# Patient Record
Sex: Male | Born: 1935 | Race: White | Hispanic: No | Marital: Single | Smoking: Former smoker
Health system: Southern US, Community
[De-identification: ages and names within clinical notes are randomized; demographics above are authoritative.]

## PROBLEM LIST (undated history)

## (undated) DIAGNOSIS — Z5181 Encounter for therapeutic drug level monitoring: Secondary | ICD-10-CM

## (undated) DIAGNOSIS — I2510 Atherosclerotic heart disease of native coronary artery without angina pectoris: Secondary | ICD-10-CM

## (undated) DIAGNOSIS — F32A Depression, unspecified: Secondary | ICD-10-CM

## (undated) DIAGNOSIS — I10 Essential (primary) hypertension: Secondary | ICD-10-CM

## (undated) DIAGNOSIS — I209 Angina pectoris, unspecified: Secondary | ICD-10-CM

## (undated) DIAGNOSIS — E871 Hypo-osmolality and hyponatremia: Secondary | ICD-10-CM

## (undated) DIAGNOSIS — R269 Unspecified abnormalities of gait and mobility: Secondary | ICD-10-CM

## (undated) DIAGNOSIS — R2681 Unsteadiness on feet: Secondary | ICD-10-CM

## (undated) DIAGNOSIS — F419 Anxiety disorder, unspecified: Secondary | ICD-10-CM

## (undated) DIAGNOSIS — R569 Unspecified convulsions: Secondary | ICD-10-CM

## (undated) DIAGNOSIS — G43909 Migraine, unspecified, not intractable, without status migrainosus: Secondary | ICD-10-CM

## (undated) DIAGNOSIS — E538 Deficiency of other specified B group vitamins: Secondary | ICD-10-CM

## (undated) DIAGNOSIS — I4891 Unspecified atrial fibrillation: Secondary | ICD-10-CM

## (undated) DIAGNOSIS — E785 Hyperlipidemia, unspecified: Secondary | ICD-10-CM

## (undated) DIAGNOSIS — Z7901 Long term (current) use of anticoagulants: Secondary | ICD-10-CM

## (undated) DIAGNOSIS — E349 Endocrine disorder, unspecified: Secondary | ICD-10-CM

## (undated) DIAGNOSIS — K219 Gastro-esophageal reflux disease without esophagitis: Secondary | ICD-10-CM

## (undated) DIAGNOSIS — K649 Unspecified hemorrhoids: Secondary | ICD-10-CM

## (undated) DIAGNOSIS — F329 Major depressive disorder, single episode, unspecified: Secondary | ICD-10-CM

## (undated) DIAGNOSIS — R079 Chest pain, unspecified: Secondary | ICD-10-CM

## (undated) DIAGNOSIS — N400 Enlarged prostate without lower urinary tract symptoms: Secondary | ICD-10-CM

## (undated) DIAGNOSIS — G40409 Other generalized epilepsy and epileptic syndromes, not intractable, without status epilepticus: Secondary | ICD-10-CM

## (undated) DIAGNOSIS — W19XXXA Unspecified fall, initial encounter: Secondary | ICD-10-CM

## (undated) DIAGNOSIS — R296 Repeated falls: Secondary | ICD-10-CM

## (undated) DIAGNOSIS — M6281 Muscle weakness (generalized): Secondary | ICD-10-CM

## (undated) DIAGNOSIS — H9190 Unspecified hearing loss, unspecified ear: Secondary | ICD-10-CM

## (undated) DIAGNOSIS — I509 Heart failure, unspecified: Secondary | ICD-10-CM

## (undated) DIAGNOSIS — I639 Cerebral infarction, unspecified: Secondary | ICD-10-CM

## (undated) DIAGNOSIS — E860 Dehydration: Secondary | ICD-10-CM

## (undated) DIAGNOSIS — G40909 Epilepsy, unspecified, not intractable, without status epilepticus: Secondary | ICD-10-CM

## (undated) DIAGNOSIS — R29898 Other symptoms and signs involving the musculoskeletal system: Secondary | ICD-10-CM

## (undated) HISTORY — DX: Endocrine disorder, unspecified: E34.9

## (undated) HISTORY — DX: Unspecified abnormalities of gait and mobility: R26.9

## (undated) HISTORY — PX: HERNIA REPAIR: SHX51

## (undated) HISTORY — DX: Unspecified atrial fibrillation: I48.91

## (undated) HISTORY — DX: Hyperlipidemia, unspecified: E78.5

## (undated) HISTORY — DX: Essential (primary) hypertension: I10

## (undated) HISTORY — PX: CATARACT EXTRACTION W/ INTRAOCULAR LENS IMPLANT: SHX1309

## (undated) HISTORY — DX: Chest pain, unspecified: R07.9

## (undated) HISTORY — DX: Dehydration: E86.0

## (undated) HISTORY — DX: Unspecified convulsions: R56.9

## (undated) HISTORY — PX: PILONIDAL CYST EXCISION: SHX744

## (undated) HISTORY — DX: Encounter for therapeutic drug level monitoring: Z51.81

## (undated) HISTORY — DX: Long term (current) use of anticoagulants: Z79.01

## (undated) HISTORY — DX: Unspecified hearing loss, unspecified ear: H91.90

## (undated) HISTORY — DX: Other symptoms and signs involving the musculoskeletal system: R29.898

## (undated) HISTORY — DX: Repeated falls: R29.6

## (undated) HISTORY — DX: Hypo-osmolality and hyponatremia: E87.1

## (undated) HISTORY — PX: WRIST FRACTURE SURGERY: SHX121

## (undated) HISTORY — DX: Unspecified hemorrhoids: K64.9

## (undated) HISTORY — DX: Muscle weakness (generalized): M62.81

## (undated) HISTORY — PX: TONSILLECTOMY: SUR1361

## (undated) HISTORY — DX: Unsteadiness on feet: R26.81

## (undated) HISTORY — PX: CARDIAC CATHETERIZATION: SHX172

---

## 1967-12-17 HISTORY — PX: INGUINAL HERNIA REPAIR: SUR1180

## 1985-12-16 HISTORY — PX: ANGIOPLASTY: SHX39

## 1994-12-16 HISTORY — PX: ANKLE FRACTURE SURGERY: SHX122

## 1999-10-15 ENCOUNTER — Encounter: Payer: Self-pay | Admitting: Emergency Medicine

## 1999-10-15 ENCOUNTER — Inpatient Hospital Stay (HOSPITAL_COMMUNITY): Admission: EM | Admit: 1999-10-15 | Discharge: 1999-10-16 | Payer: Self-pay | Admitting: Emergency Medicine

## 2000-03-26 ENCOUNTER — Encounter: Payer: Self-pay | Admitting: Emergency Medicine

## 2000-03-26 ENCOUNTER — Inpatient Hospital Stay (HOSPITAL_COMMUNITY): Admission: EM | Admit: 2000-03-26 | Discharge: 2000-04-07 | Payer: Self-pay | Admitting: Emergency Medicine

## 2000-03-31 ENCOUNTER — Encounter: Payer: Self-pay | Admitting: Thoracic Surgery (Cardiothoracic Vascular Surgery)

## 2000-04-01 ENCOUNTER — Encounter: Payer: Self-pay | Admitting: Thoracic Surgery (Cardiothoracic Vascular Surgery)

## 2000-04-01 HISTORY — PX: CORONARY ARTERY BYPASS GRAFT: SHX141

## 2000-04-02 ENCOUNTER — Encounter: Payer: Self-pay | Admitting: Thoracic Surgery (Cardiothoracic Vascular Surgery)

## 2000-04-29 ENCOUNTER — Encounter (HOSPITAL_COMMUNITY): Admission: RE | Admit: 2000-04-29 | Discharge: 2000-07-28 | Payer: Self-pay | Admitting: Cardiology

## 2001-04-30 ENCOUNTER — Encounter: Admission: RE | Admit: 2001-04-30 | Discharge: 2001-04-30 | Payer: Self-pay | Admitting: Internal Medicine

## 2001-04-30 ENCOUNTER — Encounter: Payer: Self-pay | Admitting: Internal Medicine

## 2002-07-09 ENCOUNTER — Ambulatory Visit (HOSPITAL_COMMUNITY): Admission: RE | Admit: 2002-07-09 | Discharge: 2002-07-09 | Payer: Self-pay | Admitting: Gastroenterology

## 2003-03-11 HISTORY — PX: US ECHOCARDIOGRAPHY: HXRAD669

## 2004-01-03 ENCOUNTER — Observation Stay (HOSPITAL_COMMUNITY): Admission: EM | Admit: 2004-01-03 | Discharge: 2004-01-04 | Payer: Self-pay | Admitting: Emergency Medicine

## 2004-11-05 HISTORY — PX: CARDIOVASCULAR STRESS TEST: SHX262

## 2005-01-04 ENCOUNTER — Ambulatory Visit (HOSPITAL_COMMUNITY): Admission: RE | Admit: 2005-01-04 | Discharge: 2005-01-04 | Payer: Self-pay | Admitting: Cardiology

## 2005-01-04 HISTORY — PX: CARDIAC CATHETERIZATION: SHX172

## 2007-02-10 ENCOUNTER — Inpatient Hospital Stay (HOSPITAL_COMMUNITY): Admission: EM | Admit: 2007-02-10 | Discharge: 2007-02-12 | Payer: Self-pay | Admitting: Emergency Medicine

## 2007-02-11 HISTORY — PX: CARDIAC CATHETERIZATION: SHX172

## 2008-12-05 ENCOUNTER — Emergency Department (HOSPITAL_COMMUNITY): Admission: EM | Admit: 2008-12-05 | Discharge: 2008-12-05 | Payer: Self-pay | Admitting: Emergency Medicine

## 2008-12-16 DIAGNOSIS — I639 Cerebral infarction, unspecified: Secondary | ICD-10-CM

## 2008-12-16 HISTORY — DX: Cerebral infarction, unspecified: I63.9

## 2009-01-09 ENCOUNTER — Emergency Department (HOSPITAL_COMMUNITY): Admission: EM | Admit: 2009-01-09 | Discharge: 2009-01-09 | Payer: Self-pay | Admitting: Emergency Medicine

## 2009-01-18 HISTORY — PX: US ECHOCARDIOGRAPHY: HXRAD669

## 2009-04-18 ENCOUNTER — Ambulatory Visit: Payer: Self-pay | Admitting: Oncology

## 2009-04-24 LAB — COMPREHENSIVE METABOLIC PANEL
Albumin: 3.9 g/dL (ref 3.5–5.2)
Alkaline Phosphatase: 166 U/L — ABNORMAL HIGH (ref 39–117)
BUN: 14 mg/dL (ref 6–23)
Creatinine, Ser: 0.78 mg/dL (ref 0.40–1.50)
Glucose, Bld: 134 mg/dL — ABNORMAL HIGH (ref 70–99)
Total Bilirubin: 0.7 mg/dL (ref 0.3–1.2)

## 2009-04-24 LAB — CBC WITH DIFFERENTIAL/PLATELET
Basophils Absolute: 0 10*3/uL (ref 0.0–0.1)
Eosinophils Absolute: 0.1 10*3/uL (ref 0.0–0.5)
HGB: 14.9 g/dL (ref 13.0–17.1)
LYMPH%: 12.6 % — ABNORMAL LOW (ref 14.0–49.0)
MCH: 32.9 pg (ref 27.2–33.4)
MCV: 94.9 fL (ref 79.3–98.0)
MONO%: 9.9 % (ref 0.0–14.0)
NEUT#: 5.1 10*3/uL (ref 1.5–6.5)
NEUT%: 75.4 % — ABNORMAL HIGH (ref 39.0–75.0)
Platelets: 219 10*3/uL (ref 140–400)

## 2009-04-24 LAB — URIC ACID: Uric Acid, Serum: 4.3 mg/dL (ref 4.0–7.8)

## 2009-04-26 LAB — IMMUNOFIXATION ELECTROPHORESIS
IgA: 137 mg/dL (ref 68–378)
IgG (Immunoglobin G), Serum: 645 mg/dL — ABNORMAL LOW (ref 694–1618)
IgM, Serum: 39 mg/dL — ABNORMAL LOW (ref 60–263)
Total Protein, Serum Electrophoresis: 6.7 g/dL (ref 6.0–8.3)

## 2009-04-26 LAB — KAPPA/LAMBDA LIGHT CHAINS: Lambda Free Lght Chn: 3.29 mg/dL — ABNORMAL HIGH (ref 0.57–2.63)

## 2009-05-03 LAB — CREATININE CLEARANCE, URINE, 24 HOUR
Collection Interval-CRCL: 24 hours
Creatinine Clearance: 91 mL/min (ref 75–125)
Urine Total Volume-CRCL: 1125 mL

## 2009-05-03 LAB — UIFE/LIGHT CHAINS/TP QN, 24-HR UR
Alpha 2, Urine: DETECTED — AB
Beta, Urine: DETECTED — AB
Free Lt Chn Excr Rate: 65.25 mg/d
Total Protein, Urine-Ur/day: 79 mg/d (ref 10–140)

## 2009-07-12 ENCOUNTER — Ambulatory Visit: Payer: Self-pay | Admitting: Oncology

## 2009-07-17 ENCOUNTER — Encounter: Admission: RE | Admit: 2009-07-17 | Discharge: 2009-09-18 | Payer: Self-pay | Admitting: Neurology

## 2009-08-10 LAB — CBC WITH DIFFERENTIAL/PLATELET
Basophils Absolute: 0 10*3/uL (ref 0.0–0.1)
Eosinophils Absolute: 0.2 10*3/uL (ref 0.0–0.5)
HGB: 16.2 g/dL (ref 13.0–17.1)
MCV: 93.9 fL (ref 79.3–98.0)
MONO%: 9.5 % (ref 0.0–14.0)
NEUT#: 4.4 10*3/uL (ref 1.5–6.5)
RBC: 5.01 10*6/uL (ref 4.20–5.82)
RDW: 12.8 % (ref 11.0–14.6)
WBC: 6.2 10*3/uL (ref 4.0–10.3)
lymph#: 0.9 10*3/uL (ref 0.9–3.3)

## 2009-08-11 ENCOUNTER — Ambulatory Visit (HOSPITAL_COMMUNITY): Admission: RE | Admit: 2009-08-11 | Discharge: 2009-08-11 | Payer: Self-pay | Admitting: Oncology

## 2009-08-11 LAB — COMPREHENSIVE METABOLIC PANEL
AST: 19 U/L (ref 0–37)
Albumin: 4.3 g/dL (ref 3.5–5.2)
Alkaline Phosphatase: 156 U/L — ABNORMAL HIGH (ref 39–117)
Calcium: 8.8 mg/dL (ref 8.4–10.5)
Chloride: 99 mEq/L (ref 96–112)
Glucose, Bld: 88 mg/dL (ref 70–99)
Potassium: 4.6 mEq/L (ref 3.5–5.3)
Sodium: 136 mEq/L (ref 135–145)
Total Protein: 6.7 g/dL (ref 6.0–8.3)

## 2009-12-18 ENCOUNTER — Ambulatory Visit: Payer: Self-pay | Admitting: Oncology

## 2009-12-21 LAB — CBC WITH DIFFERENTIAL/PLATELET
BASO%: 0.4 % (ref 0.0–2.0)
Eosinophils Absolute: 0.1 10*3/uL (ref 0.0–0.5)
HCT: 46.4 % (ref 38.4–49.9)
HGB: 15.9 g/dL (ref 13.0–17.1)
MCHC: 34.3 g/dL (ref 32.0–36.0)
MONO#: 0.6 10*3/uL (ref 0.1–0.9)
NEUT#: 4.4 10*3/uL (ref 1.5–6.5)
NEUT%: 73 % (ref 39.0–75.0)
WBC: 6.1 10*3/uL (ref 4.0–10.3)
lymph#: 0.9 10*3/uL (ref 0.9–3.3)

## 2009-12-21 LAB — COMPREHENSIVE METABOLIC PANEL
ALT: 12 U/L (ref 0–53)
AST: 19 U/L (ref 0–37)
Albumin: 4.4 g/dL (ref 3.5–5.2)
BUN: 16 mg/dL (ref 6–23)
Calcium: 8.2 mg/dL — ABNORMAL LOW (ref 8.4–10.5)
Chloride: 101 mEq/L (ref 96–112)
Potassium: 4.7 mEq/L (ref 3.5–5.3)

## 2009-12-21 LAB — IGG, IGA, IGM: IgM, Serum: 34 mg/dL — ABNORMAL LOW (ref 60–263)

## 2010-04-19 ENCOUNTER — Ambulatory Visit: Payer: Self-pay | Admitting: Oncology

## 2010-04-20 LAB — CBC WITH DIFFERENTIAL/PLATELET
BASO%: 0.5 % (ref 0.0–2.0)
EOS%: 2.8 % (ref 0.0–7.0)
HCT: 48.9 % (ref 38.4–49.9)
LYMPH%: 16.6 % (ref 14.0–49.0)
MCH: 32.5 pg (ref 27.2–33.4)
MCHC: 34.2 g/dL (ref 32.0–36.0)
MONO%: 11.3 % (ref 0.0–14.0)
NEUT%: 68.8 % (ref 39.0–75.0)
Platelets: 200 10*3/uL (ref 140–400)

## 2010-04-20 LAB — COMPREHENSIVE METABOLIC PANEL
ALT: 14 U/L (ref 0–53)
AST: 24 U/L (ref 0–37)
Creatinine, Ser: 0.89 mg/dL (ref 0.40–1.50)
Total Bilirubin: 0.4 mg/dL (ref 0.3–1.2)

## 2010-04-20 LAB — IGG, IGA, IGM: IgM, Serum: 38 mg/dL — ABNORMAL LOW (ref 60–263)

## 2010-04-20 LAB — LACTATE DEHYDROGENASE: LDH: 185 U/L (ref 94–250)

## 2010-08-07 ENCOUNTER — Ambulatory Visit: Payer: Self-pay | Admitting: Cardiology

## 2010-10-18 ENCOUNTER — Ambulatory Visit: Payer: Self-pay | Admitting: Oncology

## 2010-10-22 LAB — IGG, IGA, IGM: IgM, Serum: 29 mg/dL — ABNORMAL LOW (ref 60–263)

## 2010-10-22 LAB — COMPREHENSIVE METABOLIC PANEL
ALT: 10 U/L (ref 0–53)
AST: 17 U/L (ref 0–37)
Albumin: 4.3 g/dL (ref 3.5–5.2)
BUN: 15 mg/dL (ref 6–23)
Calcium: 8.8 mg/dL (ref 8.4–10.5)
Chloride: 100 mEq/L (ref 96–112)
Potassium: 4.9 mEq/L (ref 3.5–5.3)
Sodium: 135 mEq/L (ref 135–145)
Total Protein: 6.2 g/dL (ref 6.0–8.3)

## 2010-10-22 LAB — CBC WITH DIFFERENTIAL/PLATELET
BASO%: 0.6 % (ref 0.0–2.0)
HCT: 51.1 % — ABNORMAL HIGH (ref 38.4–49.9)
HGB: 17.3 g/dL — ABNORMAL HIGH (ref 13.0–17.1)
MCHC: 33.8 g/dL (ref 32.0–36.0)
MONO#: 0.6 10*3/uL (ref 0.1–0.9)
NEUT%: 75.1 % — ABNORMAL HIGH (ref 39.0–75.0)
RDW: 13.4 % (ref 11.0–14.6)
WBC: 6 10*3/uL (ref 4.0–10.3)
lymph#: 0.7 10*3/uL — ABNORMAL LOW (ref 0.9–3.3)

## 2011-01-07 ENCOUNTER — Encounter (HOSPITAL_COMMUNITY): Payer: Self-pay | Admitting: Oncology

## 2011-02-07 ENCOUNTER — Ambulatory Visit (INDEPENDENT_AMBULATORY_CARE_PROVIDER_SITE_OTHER): Payer: Medicare Other | Admitting: Cardiology

## 2011-02-07 DIAGNOSIS — I4891 Unspecified atrial fibrillation: Secondary | ICD-10-CM

## 2011-02-07 DIAGNOSIS — I2510 Atherosclerotic heart disease of native coronary artery without angina pectoris: Secondary | ICD-10-CM

## 2011-04-01 LAB — URINALYSIS, ROUTINE W REFLEX MICROSCOPIC
Bilirubin Urine: NEGATIVE
Glucose, UA: NEGATIVE mg/dL
Ketones, ur: NEGATIVE mg/dL
Specific Gravity, Urine: 1.025 (ref 1.005–1.030)
pH: 5.5 (ref 5.0–8.0)

## 2011-04-01 LAB — URINE MICROSCOPIC-ADD ON

## 2011-04-28 ENCOUNTER — Emergency Department (HOSPITAL_COMMUNITY)
Admission: EM | Admit: 2011-04-28 | Discharge: 2011-04-28 | Disposition: A | Discharge status: Home / Self Care | Payer: Medicare Other | Attending: Emergency Medicine | Admitting: Emergency Medicine

## 2011-04-28 DIAGNOSIS — I4891 Unspecified atrial fibrillation: Secondary | ICD-10-CM | POA: Insufficient documentation

## 2011-04-28 DIAGNOSIS — Z79899 Other long term (current) drug therapy: Secondary | ICD-10-CM | POA: Insufficient documentation

## 2011-04-28 DIAGNOSIS — Z7901 Long term (current) use of anticoagulants: Secondary | ICD-10-CM | POA: Insufficient documentation

## 2011-04-28 DIAGNOSIS — I2510 Atherosclerotic heart disease of native coronary artery without angina pectoris: Secondary | ICD-10-CM | POA: Insufficient documentation

## 2011-04-28 DIAGNOSIS — Z951 Presence of aortocoronary bypass graft: Secondary | ICD-10-CM | POA: Insufficient documentation

## 2011-04-28 DIAGNOSIS — L989 Disorder of the skin and subcutaneous tissue, unspecified: Secondary | ICD-10-CM | POA: Insufficient documentation

## 2011-04-28 DIAGNOSIS — X58XXXA Exposure to other specified factors, initial encounter: Secondary | ICD-10-CM | POA: Insufficient documentation

## 2011-04-28 DIAGNOSIS — S2095XA Superficial foreign body of unspecified parts of thorax, initial encounter: Secondary | ICD-10-CM | POA: Insufficient documentation

## 2011-04-30 NOTE — Consult Note (Signed)
NAME:  Joseph Bentley, Joseph Bentley NO.:  [id number]   MEDICAL RECORD NO.:  [id number]          PATIENT TYPE:  EMS   LOCATION:  MAJO                         FACILITY:  [facility]   PHYSICIAN:  Levert Feinstein, MD          DATE OF BIRTH:  01/28/1926   DATE OF CONSULTATION:  DATE OF DISCHARGE:                                 CONSULTATION   CLINICAL HISTORY:  The patient is an 77 year old gentleman with previous  history of seizure presenting with recurrent seizure x2 this afternoon.   The patient's wife was driving him to a restaurant, stopped at a stop  sign.  The patient suddenly went into general tonic-clonic seizure,  lasted few minutes, and afterwards limp forward and gasping for air.  Wife later parked in front of the restaurant and called 911, and when  she met him 1 hour later about 12:30 afternoon in the hospital, the  patient was awake and alert; however, about 30 minutes later, 1 hour  from the initial seizure, he suffered his second general tonic-clonic,  lasted for few minutes followed by post event confusion.  He was given  2mg  Ativan at 1:40 p.m., 3 hours prior to today's examination, also  loaded with Dilantin 500 mg IV.   The patient is sleepy, but denied lateralized motor sensory deficit of  vision change, was able to give accurate history, and Dilantin level was  12.  He was supposed to take Dilantin 200 b.i.d.  He has been compliant  with the medications.   CT of the brain demonstrate no acute change other than subacute to  chronic bilateral external capsule stroke.   He also had a past medical history of atrial fibrillation on Coumadin.  Coronary artery disease.   PAST MEDICAL HISTORY:  1. Atrial fibrillation.  2. Coronary artery disease.  3. Hypertension.  4. GERD.   PAST SURGICAL HISTORY:  CABG.   FAMILY HISTORY:  He lives with his wife.  Denies smoking and drinking.  Retired.  Highly functional.  Last seizure was 3 years ago.   FAMILY HISTORY:   Noncontributory.   HOME MEDICATIONS:  1. Dilantin 200 b.i.d.  2. Diovan 80 mg once a day.  3. Metoprolol 100 mg once a day.  4. Pantoprazole 40 mg once a day.  5. Crestor 20 mg once a day.   HISTORY OF PRESENT ILLNESS:  The patient has a history of epilepsy since  childhood, has been on Dilantin treatment for 40 years, and the last  seizure was 3 years ago, and reported has been under better control when  he was younger.   PHYSICAL EXAMINATION:  VIAL SIGNS:  Temperature 97.3, blood pressure  182/92 upon initial presentation, now is 138/85, heart rate of 100,  respiration of 20.  CARDIAC:  Tachy irregular.  PULMONARY:  Clear to auscultation bilaterally.  NECK:  Supple.  No carotid bruits.  NEUROLOGIC:  He is sleepy but was able to answer question correctly,  gave accurate history and followed neurological examination.  Cranial  nerves II through XII.  Pupil equal, round, and reactive to  light and  extraocular movements were full.  There was end-gaze horizontal  nystagmus, and right fundi were sharp.  Visual fields were full on  confrontational test.  Facial sensation strength was normal.  Uvula and  tongue midline.  Head turning shoulder shrugging normal symmetric.  Tongue protrusion into cheek strength was normal.  Motor examination  normal tone park and strength and then sensory normal to light touch and  pinprick.  Deep tendon reflex hypoactive and symmetric.  Plantar  responses were flexor.  Coordination:  Normal finger-to-nose, heel-to-  shin.  Gait was deferred.   LABORATORY EVALUATION:  INR was 2.  Dilantin level is 12.  CBC has  demonstrated elevated hemoglobin of 18.   ASSESSMENT AND PLAN:  An 77 year old male with history of epilepsy with  recurrent seizure.  1. Continue to observe.  Okay to discharge home if the patient becomes      less sleepy and regain previous functional level.  2. Discharge home with Dilantin 200 mg b.i.d.  3. Follow up with Dr. Anne Hahn as  previously scheduled  on Wednesday,      December 30th, 2009.      Levert Feinstein, MD  Electronically Signed     YY/MEDQ  D:  12/05/2008  T:  12/06/2008  Job:  [id number]

## 2011-05-03 NOTE — H&P (Signed)
NAME:  Joseph Bentley, Joseph Bentley                         ACCOUNT NO.:  [id number]   MEDICAL RECORD NO.:  [id number]                   PATIENT TYPE:  OBV   LOCATION:  1832                                 FACILITY:  [facility]   PHYSICIAN:  [name], M.D.                 DATE OF BIRTH:  05/30/1926   DATE OF ADMISSION:  01/02/2004  DATE OF DISCHARGE:                                HISTORY & PHYSICAL   PRIMARY CARE PHYSICIAN:  Dr. __________ .   NEUROLOGIST:  Marlan Palau, M.D.   CARDIOLOGIST:  Dr. Colleen Can. Deborah Chalk, M.D.   CHIEF COMPLAINT:  Seizure, low Dilantin level and postictal state.   HISTORY OF PRESENT ILLNESS:  A 77 year old male with a history of seizure  disorder, since 77 years old, on Dilantin treatment, followed by Dr. Anne Hahn,  who had hemoptysis associated with an upper respiratory tract infection,  bronchitis, treated with a Z-Pak and backing off on some of his Coumadin.  This was followed by diarrhea over the last 2-3 days.  The diarrhea is  currently improving, but he did have some today.  He has one more dose of  azithromycin left.  Tonight he was doing well until about 7:30, when he  started having seizures.  EMS was called.  He had multiple seizures  throughout home, EMS and the emergency room approximately 3-4.  The longest  one lasted approximately 5-10 minutes, full body tonic/clonic jerking.  The  last seizure was somewhere around 2002.  He was given Dilantin 500 mg IV x  1, Cardizem 10 mg IV x 1 for a rapid Afib and 2 mg of IV Ativan x 2-3 doses.  He is now postictal but coming out of it.  On arrival, he had a  nonrebreather mask and had to tie down his hands due to the fact that he was  pulling out all lines and telemetry leads, currently improving.   PAST MEDICAL HISTORY:  1. Colonoscopy, July 2003, normal except internal hemorrhoids.  2. Coronary artery disease, three vessel coronary bypass grafting.  3. Pilonidal cyst.  4. Seizure disorder since 13.  5.  History of Afib.  6. Bilateral hernia replacement repair.  7. T&A.  8. Hypertension.  9. Hyperlipidemia.   ALLERGIES:  No known drug allergies.   MEDICATIONS:  1. Dilantin 200 b.i.d.  2. Coumadin 5 every day.  3. Fosamax 70 every week.  4. Lipitor 80 every day.  5. Toprol XL 100 every day.  6. Diovan 80 every day.   SOCIAL HISTORY:  He is retired.  He lives with his wife.  No tobacco, no  alcohol.   FAMILY HISTORY:  Coronary disease, hypertension.   REVIEW OF SYSTEMS:  Please see HPI.  He has had a cough, hemoptysis, sputum,  upper respiratory symptoms, diarrhea but no chest pain, no shortness of  breath, no other symptoms at this current time.  All  organ systems reviewed  and negative.   PHYSICAL EXAMINATION:  VITAL SIGNS:  Blood pressure 129/74, heart rate 100-  119, sating 97% on 2 liters.  Breathing 18 times a minute and afebrile.  GENERAL:  Groggy but will arouse and answer questions appropriately.  EARS/NOSE/THROAT:  Tongue bitten and swollen.  PERL.  EOMI.  Cranial nerves  II-XII grossly intact.  PULMONARY:  Clear to auscultation bilaterally.  CARDIAC:  Irregular and tachycardic.  ABDOMEN:  Soft and nontender.  EXTREMITIES:  No edema.  NEUROLOGIC:  Grossly intact at this moment.   ANCILLARY DATA:  Urinalysis is negative except for some proteinuria and 3-6  red blood cells per high-powered field.  Laboratory data shows a sodium of  132, potassium 4.3, chloride 102, bicarb 16, BUN 17, creatinine 0.7, glucose  207, pH was 7.24.  Hemoglobin 16.0, white blood cell count 7.3, platelet  count 286.  INR 1.8.  Dilantin 10.3.   Cranial CT negative except for sinusitis.   EKG shows Afib, RVR, PVCs, now heart rate is slower.   ASSESSMENT:  1. This is an elderly male with 3-4 seizures tonight, now postictal.  2. In relation to upper respiratory infection, antibiotic use and diarrhea,     status post Dilantin and Ativan treatment here in the emergency room.   PLAN:  1. A  23 hour observation.  2. Seizure precautions.  3. Agree with IV Dilantin that has been given.  4. Allow the patient to wake up.  5. Continue with Dr. Anne Hahn evaluation in the morning, if not completely     better.  6. Check chest x-ray PA and lateral in the a.m.  7. Hold on antibiotics, one more dose of azithromycin and for the sinusitis     seen on CT until Dr. __________ has a chance to see and evaluate the     patient in the morning.  8. Afib with RVR status post 10 mg Cardizem IV.  We will start his Toprol XL     back and allow his heart rate to come down to his baseline.  Manage INR     at 1.8-2.5.  9. Increased blood sugars.  We will follow hemoglobin A1c and check CBGs.  10.      Do not feed until awake and alert, avoid aspiration.  11.      We will check a B-MET in the morning.  His acidosis should resolve     the further away he gets from the seizure.  He probably has an underlying     lactic acidosis from the seizure as noted above.                                                [name], M.D.    JMR/MEDQ  D:  01/03/2004  T:  01/03/2004  Job:  [id number]

## 2011-05-03 NOTE — H&P (Signed)
NAME:  ALBERT, HERSCH NO.:  [id number]   MEDICAL RECORD NO.:  [id number]          PATIENT TYPE:  EMS   LOCATION:  MAJO                         FACILITY:  [facility]   PHYSICIAN:  Colleen Can. Deborah Chalk, M.D.DATE OF BIRTH:  September 09, 1926   DATE OF ADMISSION:  02/10/2007  DATE OF DISCHARGE:                              HISTORY & PHYSICAL   HISTORY OF PRESENT ILLNESS:  Claudius Mich is a 77 year old white man  who is admitted to [facility] for further evaluation of chest  pain.   The patient has a history of coronary artery disease which dates back to  53.  At that time, he underwent PTCA of an unknown vessel.  In 2001,  he underwent coronary artery bypass surgery.  He received a left  internal mammary artery graft to the LAD, a saphenous vein graft to the  first diagonal, a sequential saphenous vein graft to the first and then  third obtuse marginals and a sequential saphenous vein graft to the  right coronary artery and then posterior descending artery.  His last  cardiac catheterization was performed in January 2006.  This  demonstrated essentially normal left ventricular function with very  minimal anterior hypokinesis.  There was severe three-vessel coronary  artery disease with a totally occluded right coronary artery, totally  occluded LAD and totally occluded obtuse marginal.  All saphenous vein  grafts were patent.  Continued medical therapy was recommended.   The patient presented to the emergency department after experiencing an  episode of chest pain this evening; it awoke him for sleep.  The chest  pain was described as a pressure in the left anterior lower chest.  It  did not radiate.  It was not associated with dyspnea, diaphoresis, or  nausea.  There were no exacerbating or ameliorating factors.  It  appeared not to be related to position, activity, meals or respirations.  He did not take a nitroglycerin.  It resolved spontaneously in  approximately 1 hour.  He reports that it was similar in quality to his  prior cardiac chest pain.  He adds that he has experienced 2 additional,  similar episodes in the last week.  He is free of chest pain at this  time and is otherwise asymptomatic.   The patient also has a history of chronic atrial fibrillation.  There is  no history of congestive heart failure.   The patient has a number of risk factors for coronary artery disease  including hypertension, dyslipidemia, and family history (father and  mother both suffered from coronary artery disease).  There is no history  of diabetes mellitus or smoking.   PAST MEDICAL HISTORY:  The patient's past medical history is otherwise  remarkable only for a seizure disorder.   MEDICATIONS:  1. Dilantin 200 mg p.o. b.i.d.  2. Toprol-XL 100 mg p.o. daily.  3. Diovan 80 mg p.o. daily.  4. Coumadin 5 mg p.o. daily, except on Friday, 2.5 mg p.o.   ALLERGIES:  None.   OPERATIONS:  1. Coronary artery bypass surgery.  2. Excision of a pilonidal cyst.  SOCIAL HISTORY:  The patient lives with his wife.  He is a retired  [employer] manager.  He does not drink alcohol.  He does not smoke  cigarettes.   FAMILY HISTORY:  Notable for coronary artery disease in both of his  parents.   REVIEW OF SYSTEMS:  Review of systems reveals no new problems related to  his head, eyes, ears, nose, mouth, throat, lungs, gastrointestinal  system, genitourinary system, or extremities.  There is no history of  neurologic or psychiatric disorder.  There is no history of fever,  chills or weight loss.   PHYSICAL EXAMINATION:  VITAL SIGNS:  Blood pressure 165/90.  Pulse 95  and irregularly irregular.  Respirations 20.  Temperature 97.5.  GENERAL:  The patient was an elderly white man in no discomfort.  He was  alert, oriented, appropriate, and responsive.  HEENT:  Head, eyes, nose, and mouth were normal.  NECK:  Without thyromegaly or adenopathy.  Carotid  pulses were palpable  bilaterally and without bruits.  CARDIAC:  Examination revealed an irregularly irregular rhythm.  There  was no murmur, rub, gallop, or click.  No chest wall tenderness was  noted.  LUNGS:  Clear.  ABDOMEN:  Soft and nontender.  There was no mass, hepatosplenomegaly,  bruit, distention, rebound, guarding, or rigidity.  Bowel sounds were  normal.  RECTAL AND GENITAL:  Examinations were not performed as they were not  pertinent to the reason for acute care hospitalization.  EXTREMITIES:  Without edema, deviation, or deformity.  Radial and  dorsalis pedal pulses were palpable bilaterally.  NEUROLOGIC:  Brief screening neurologic survey was unremarkable.   LABORATORY AND ACCESSORY CLINICAL DATA:  The electrocardiogram revealed  atrial fibrillation with a ventricular rate of 78 beats per minute.   The chest radiograph revealed no evidence of congestive heart failure or  pneumonia.   The initial set of cardiac markers revealed a myoglobin of 56.8, CK-MB  1.5, and troponin less than 0.05.  Potassium is 4.2, BUN 25, and  creatinine 0.9.  White count was 5.8 with a hemoglobin of 14.9 and  hematocrit of 43.2.  The remaining studies were pending at the time of  this dictation.   IMPRESSION:  1. Chest pain; rule out unstable angina.  2. Coronary artery disease, status post coronary artery bypass surgery      in 2001, detailed above.  The last cardiac catheterization,      performed in January 2006, demonstrated all grafts to be patent.  3. Chronic atrial fibrillation.  4. Hypertension.  5. Dyslipidemia.  6. Seizure disorder.   PLAN:  1. Telemetry.  2. Serial cardiac enzymes.  3. No aspirin, heparin, or Lovenox, as INR is 2.3.  4. Intravenous nitroglycerin.  5. Further measures per Dr. Deborah Chalk.      Ulyses Amor, MD   Electronically Signed     ______________________________ Colleen Can. Deborah Chalk, M.D.    MSC/MEDQ  D:  02/10/2007  T:  02/10/2007   Job:  [id number]   cc:   Ulyses Amor, MD

## 2011-05-03 NOTE — H&P (Signed)
[place]  Patient:    Joseph Bentley, Joseph Bentley                      MRN: [id number] Adm. Date:  [id number] Attending:  Dolores Patty CC:         Mahala Menghini. Evonnie Dawes, M.D. [facility]             [employer]. Deborah Chalk, M.D.                         History and Physical  HISTORY:  Joseph Bentley is a 77 year old white male admitted with chest pain, which he describes as "tightness."  It is described as substernal without radiation; t occurred this morning after he had walked an hour and while he was showering. e did not have actual symptoms while walking.  This was associated with profuse diaphoresis, according to his wife.  He denies nausea.  It lasted for a total of 1-2 minutes.  He did not take any treatment for this but is on aspirin each morning; he had taken his morning aspirin.  PAST HISTORY:  He had angioplasty in 1987 by Dr. Delfin Edis.  He has had surgery for pilonidal cyst, hernia bilaterally, and tonsillectomy.  He has had a history of seizure disorder since the age of 28.  His last seizure in October 2000.  MEDICATIONS:  1. Aspirin 325 mg daily.  2. Toprol XL 50 mg daily.  3. Dilantin 100 mg 2 twice a day.  4. Multivitamins.  ALLERGIES:  No known drug allergies.  SOCIAL HISTORY:  He is a retired Tree surgeon.  He does not drink or smoke.  FAMILY HISTORY:  Positive for myocardial infarction in his father at 57 and brother at 23.  His father also had hypertension.  There is no family history of cancer or diabetes.  REVIEW OF SYSTEMS:  Negative except for the following items:  His wife states that he does snore with possible apnea for 1-2 seconds.  He describes an occasional ry cough.  He denies taking any ACE inhibitors.  As stated, the remainder of the review of systems was negative.  Specifically, he has had no other cardiopulmonary symptoms other than the symptoms outlined above and the dry cough.  He has no GU symptoms and  denies any arthritis.  PHYSICAL EXAMINATION:  GENERAL:  He is in no acute distress.  VITAL SIGNS:  Blood pressure 152/83, pulse 62 and regular with a normal sinus rhythm on telemetry.  Respiratory rate is 18.  HEENT:  There is slight ptosis on the left.  Arteriolar narrowing is present. There is fullness in the posterior pharynx without airway obstruction.  NECK:  There is a possible faint right carotid bruit.  HEART:  An S4 is noted with a grade 1/2 systolic murmur.  Minor rales are noted, but there is no increased work of breathing.  ABDOMEN:  Soft and nontender.  He has operative scars in the lower quadrants bilaterally.  EXTREMITIES:  A fine tremor is noted of the left upper extremity.  He has no edema and pedal pulses are intact.  There is full range of motion of extremities.  NEUROPSYCHIATRIC:  Grossly intact.  DIAGNOSTIC STUDIES:  EKG reveals sinus bradycardia.  PLAN:  He is admitted to telemetry with cardiac enzymes because of the suggested history, the previous history of angioplasty, and the positive family history.  Dr. Deborah Chalk will be consulted.  He  will receive parenteral pain medications.  _________ _________ appears to be quiescent and he will be continued on his Dilantin.  He is mildly hypertensive and the Toprol dose may need to be increased.  There is a question of possible sleep apnea and there are some posterior pharyngeal anatomic changes.  Once he is stable, sleep evaluation could be conducted as an outpatient if Dr. Evonnie Dawes feels this is indicated. DD:  03/26/00 TD:  03/27/00 Job: [id number]

## 2011-05-03 NOTE — Discharge Summary (Signed)
NAME:  Joseph Bentley, Joseph Bentley NO.:  [id number]   MEDICAL RECORD NO.:  [id number]          PATIENT TYPE:  INP   LOCATION:  4715                         FACILITY:  [facility]   PHYSICIAN:  Colleen Can. Deborah Chalk, M.D.DATE OF BIRTH:  [birth year]   DATE OF ADMISSION:  02/09/2007  DATE OF DISCHARGE:  02/12/2007                               DISCHARGE SUMMARY   PRIMARY DISCHARGE DIAGNOSIS:  Chest pain with subsequent elective  cardiac catheterization on February 11, 2007, showing global ejection  fraction of 60%.  There is minimal anterior hypokinesis.  The saphenous  vein graft to the right coronary and posterior descending is patent.  Saphenous vein graft to the diagonal was patent. Saphenous vein graft to  the obtuse marginal 1 and 2 is patent and left internal mammary to the  left anterior descending is patent.  The native coronary artery showed  100% occluded right coronary with a 95% stenosis proximally before small  acute marginal branch.  The left circumflex is 100% occluded at the  first obtuse marginal with competitive flow to the second obtuse  marginal.  The left anterior descending has 100% proximal narrowing  after septal perforating branch.  The left main has an ostial 50%  lesion.   SECONDARY DISCHARGE DIAGNOSES:  1. Known atherosclerotic cardiovascular disease that dates back to      1987 with remote history of angioplasty and subsequent coronary      artery bypass grafting in 2001.  2. Chronic atrial fibrillation.  3. Chronic Coumadin.  4. Dyslipidemia.  The patient is currently not on Statin therapy.   HISTORY OF PRESENT ILLNESS:  The patient is a very pleasant 77 year old  white male who presents to the emergency department after having an  episode of chest pain earlier in the evening.  It actually awoke him  from his sleep.  It was described as a pressure-like sensation in the  left anterior lower chest.  It did not radiate and was not associated  with any  shortness of breath, diaphoresis or nausea.  He did not take  nitroglycerin, it resolved in approximately 1 hour.  It seemed somewhat  similar to his previous chest pain syndrome and he had had two similar  episodes in the week prior.  He subsequently presented to the emergency  room where he was admitted for further evaluation.   LABORATORY DATA ON ADMISSION:  His cardiac enzymes were negative.  Sodium was 134, potassium 4, chloride 100, CO2 30, BUN 13, creatinine  0.6.  His INR was 2.1.  CBC was normal.   His EKG showed atrial fibrillation.   Portable chest x-ray showed no acute chest findings with status post  CABG changes.   HOSPITAL COURSE:  The patient was admitted electively.  He was placed on  IV nitroglycerin. Coumadin was held.  We proceeded on with cardiac  catheterization the following day.  Those results are as noted above.  Overall it was felt the patient could best be managed medically.   Today on February 12, 2007, he is doing well without complaints.  His  groin is unremarkable.  He remains in chronic atrial fibrillation.  His  rate has been somewhat variable in the 80s to low 100s, but overall he  is felt to be a satisfactory candidate for discharge today.   DISCHARGE CONDITION:  Stable.   DISCHARGE MEDICINES:  1. Dilantin 200 mg two times a day.  2. Toprol XL 100 mg a day.  3. Diovan 80 mg a day.  4. Coumadin as he was taking before.  5. Fosamax weekly.  6. Protonix 40 mg a day.   We will need to assess why the patient is not on Statin therapy at this  time.   Will plan on seeing him back in the office in 2 weeks, certainly sooner  if any problems arise in the interim.      Sharlee Blew, N.P.      Colleen Can. Deborah Chalk, M.D.  Electronically Signed    LC/MEDQ  D:  02/12/2007  T:  02/12/2007  Job:  [id number]

## 2011-05-03 NOTE — Consult Note (Signed)
[place]. [facility]  Patient:    Joseph Bentley                       MRN: [id number] Proc. Date: 10/15/99 Adm. Date:  [id number] Attending:  [place] CC:         Pricilla Riffle. Trisha Mangle, M.D. [facility]             Hubert L. Evonnie Dawes, M.D. [facility]                          Consultation Report  Thank you for asking me to see this very nice 77 year old male for cardiologic opinion regarding atrial fibrillation.  HISTORY:  He has a lifelong history of a seizure disorder and has been most recently on Dilantin.  His last seizure was reportedly in 1975.  In 1987, he had an angioplasty by Colleen Can. Deborah Chalk, M.D. and evidently has been asymptomatic since that time without recurrence of angina or any other cardiac symptoms.  There is no prior history of arrhythmia.  He is asymptomatic normally and can do normal activities and the work that he normally wants to do.  He was found by his wife  gazing to the left, head tilting, gasping and unresponsive.  He became more alert and was confused and had a witnessed grand mal seizure after being brought to the hospital.  He was found to be in rapid atrial fibrillation and was given Cardizem with slowing of his rate.  He had some mild residual confusion following the seizure but is currently lucid, oriented and alert.  The patient currently denies any cardiac symptoms; denies chest pain, chest pressure, tightness, heaviness or shortness of breath.  He was seen in consultation for a cardiologic workup in May of this year by Macarthur Critchley. Shelva Majestic, M.D.  During that evaluation, he had an exercise echocardiogram showing atrial fibrillation with early phase of the exercise test. He was given Toprol at that time.  He was given a diagnosis of paroxysmal atrial fibrillation since that time but Mr. Helin stopped the beta blocker he was placed on at that time and has been on just Adalat.  He is not aware of  any palpitations except as noted above.  An echocardiogram during that admission showed left atrial size of 40 mm noted.  PAST HISTORY:  Remarkable for hypertension previously.  He may have had hyperlipidemia previously.  He denies ulcers.  PREVIOUS SURGERY:  Pilonidal cyst and bilateral hernia repairs.  ALLERGIES:  None.  CURRENT MEDICATIONS:  Adalat CC and Dilantin.  FAMILY HISTORY:  Mother died at age 79 with heart trouble.  Father died at age 41. Two brothers died of heart trouble, one at age 62 and one at age 25.  SOCIAL HISTORY:  He has been married for approximately five and a half years; he is married for the second time.  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  He smoked remotely years ago; does not use alcohol or use recreational drugs.  REVIEW OF SYSTEMS:  Otherwise unremarkable.  PHYSICAL EXAMINATION:  GENERAL:  On examination, he is a pleasant male who was alert and oriented.  VITAL SIGNS:  His blood pressure is 155/80. Pulse 76.  SKIN:  Warm and dry.  HEENT:  EOMI.  PERRLA.  CNS clear.  Fundi not visualized.  Pharynx negative.  NECK:  Supple without masses, thyromegaly or carotid bruits.  LUNGS:  Clear.  CARDIAC:  Normal S1 and S2.  No S3.  No murmur noted.  ABDOMEN:  Soft, nontender.  EXTREMITIES:  Femoral pulses 2+ without bruits.  Pedal pulses 2+.  ELECTROCARDIOGRAM:  EKG showed atrial fibrillation with rapid response.  IMPRESSION: 1. Atrial fibrillation which by history has been paroxysmal in the past, likely    related to the adrenergic stress of the seizure that he had today. 2. Coronary artery disease ______ of an unknown vessel.  Previous negative stress    echocardiogram in May of this year. 3. Hypertension, not well-controlled. 4. History of hyperlipidemia.  RECOMMENDATIONS:  For unclear reasons, he was taken off of Toprol before and I would recommend that he be placed back on a beta  blocker and continue Adalat at  this time; control seizure.  With echocardiogram in May, I doubt a recurrent echocardiogram will be of much value.  Will check a TSH on him and observe.DD: 10/15/99 TD:  10/16/99 Job: [id number]

## 2011-05-03 NOTE — H&P (Signed)
NAME:  Joseph Bentley, Joseph Bentley NO.:  [id number]   MEDICAL RECORD NO.:  [id number]          PATIENT TYPE:  OIB   LOCATION:                               FACILITY:  [facility]   PHYSICIAN:  Colleen Can. Deborah Chalk, M.D.DATE OF BIRTH:  February 13, 1926   DATE OF ADMISSION:  01/04/2005  DATE OF DISCHARGE:                                HISTORY & PHYSICAL   CHIEF COMPLAINT:  Chest pain.   HISTORY OF PRESENT ILLNESS:  Mr. Mapps is pleasant, 77 year old white male  who has a known history of ischemic heart disease.  He had previous bypass  grafting approximately five years ago.  He is maintained in chronic atrial  fibrillation.  He has been maintained on chronic Coumadin.  He presented to  the office as a work-in appointment on January 01, 2005.  He had a recent  sleep study performed due to concern for sleep apnea.  His wife does note  that he snores considerably as well as stops breathing during the course of  each night.  He underwent a CPAP trail last Friday night, and during the  middle of the night he became quite panicky and had a smothering-type  sensation.  He was unable to keep the C-PAP mask on due to this panic and  anxiety-like feeling.  At that time, he began to have chest pain that  persisted somewhat into the day on Saturday.  He felt like a zombie.  It  was very slow to resolve.  His pain is quick and fleeting in nature.  He has  had no other associated symptoms, but overall he just does not feel right.  He has not used nitroglycerin.  He does not that his heart rate has a  tendency towards tachycardia.  He has had no lightheadedness, dizziness, or  syncope.  In light of his complaints as well as the fact that he had a  recent negative Cardiolite study in November 2005, he is now referred on for  elective cardiac catheterization.   PAST MEDICAL HISTORY:  1.  Atherosclerotic cardiovascular disease.  He had previous coronary artery      bypass grafting x 6 in April 2001 with  left internal mammary to the LAD,      saphenous vein graft to the first diagonal, sequential saphenous vein      graft to the OM-1 and OM-3, sequential saphenous vein graft to the      distal right and posterior descending.  2.  Postoperative atrial fibrillation.  3.  Chronic Coumadin.  4.  History of seizure disorder.  5.  Hyperlipidemia.  6.  Hypertension.  7.  Atrial fibrillation.  He does have a tendency towards tachycardia-      bradycardia syndrome.  8.  Previous pilonidal cyst surgery as well as bilateral hernia repair.  9.  Remote history of angioplasty in 1987.   ALLERGIES:  None.   CURRENT MEDICATIONS:  1.  Calcium daily.  2.  Fosamax 70 mg a week.  3.  Diovan 80 mg a day.  4.  Lipitor 40 daily.  5.  Toprol  100 mg q day.  6.  Dilantin 200 mg b.i.d.  7.  Coumadin 5 mg a day.   FAMILY HISTORY:  Noncontributory.   SOCIAL HISTORY:  He is married.  He has no current alcohol or tobacco use.   REVIEW OF SYSTEMS:  He has had no recent fever, flu, or cough.  He has had  no shortness of breath.  He has been trying to remain active.  He has had  trouble titrating his CPAP machine due to anxiety.  He has some degree of  fatigue.  He notes his Coumadin dose has been fairly stable.  He has had no  bleeding, no syncope, and no complaints of edema.   PHYSICAL EXAMINATION:  VITAL SIGNS: Weight 177.  Blood pressure 110/70  sitting, 130/70 standing, heart rate 100 and irregular, respirations 18.  He  is afebrile.  SKIN:  Warm and dry.  Color is unremarkable.  GENERAL:  His mood is somewhat depressed.  LUNGS:  Basically clear.  HEART:  Irregular irregular rhythm.  ABDOMEN:  Soft, positive bowel sounds, nontender.  EXTREMITIES:  Without edema.  NEUROLOGIC:  Intact. There are no gross focal deficits.   LABORATORY DATA:  Pertinent labs are pending.   OVERALL IMPRESSION:  1.  Chest pain.  2.  Known history of ischemic heart disease, now almost five years out from      previous  bypass surgery.  3.  Hypertension.  4.  Hyperlipidemia.  5.  Chronic atrial fibrillation.  6.  Chronic Coumadin.   PLAN:  We will proceed on with elective cardiac catheterization.  The  procedure has been reviewed in full detail including the risks and benefits,  and he is willing to proceed on Friday, January 04, 2005.  His Coumadin is  stopped accordingly.  Prescription for nitroglycerin was made available as  well.       ________________________________________  Sharlee Blew, N.P.  ___________________________________________  Colleen Can. Deborah Chalk, M.D.    LC/MEDQ  D:  01/01/2005  T:  01/01/2005  Job:  [id number]   cc:   Loraine Leriche A. Waynard Edwards, M.D.  [address]  [place]  [place] [address]  Fax: [phone]

## 2011-05-03 NOTE — Cardiovascular Report (Signed)
NAME:  LEGACY, LACIVITA NO.:  [id number]   MEDICAL RECORD NO.:  [id number]          PATIENT TYPE:  INP   LOCATION:  4715                         FACILITY:  [facility]   PHYSICIAN:  Colleen Can. Deborah Chalk, M.D.DATE OF BIRTH:  09-02-1926   DATE OF PROCEDURE:  02/11/2007  DATE OF DISCHARGE:                            CARDIAC CATHETERIZATION   PROCEDURE:  Left heart catheterization with selective coronary  angiography, left ventricular angiography, saphenous vein graft  angiography x4, angiography of left internal mammary artery, and aortic  arch injection.   TYPE AND SITE OF ENTRY:  Percutaneous right femoral artery __________  catheter, 6-French 4 curved Judkins right and left coronary catheter, 6-  French pigtail ventricular catheter, left internal mammary graft  catheter.   CONTRAST:  Pure Omnipaque.   MEDICATIONS GIVEN PRIOR TO PROCEDURE:  Valium 10 mg p.o.   MEDICATIONS GIVEN DURING PROCEDURE:  None.   COMMENT:  The patient tolerated the procedure well.   HEMODYNAMIC DATA:  The aortic pressure was 163/89.  LV was 166/10-18.  There was no aortic valve gradient noted on pullback.   ANGIOGRAPHIC DATA:  Left ventricular angiogram was performed in the RAO  position.  Overall cardiac size and silhouette were normal.  Global  ejection fraction was 55-60%.  There was very minimal anterior  hypokinesia.  There was no mitral regurgitation, intracardiac  calcification or intracavitary filling defect.   1. Left main coronary artery has a 50% ostial narrowing.  2. The left anterior descending is totally occluded after a left      septal perforating branch.  It is totally occluded distally.  There      is a very small diagonal prior to this total occlusion.  It is not      felt to be significant.  The left anterior descending is totally      occluded after the septal perforating branch.  3. Left circumflex.  The left circumflex with a totally occluded first      obtuse  marginal and second obtuse marginal has 90% stenosis and has      bidirectional flow from bypass graft.  4. Native right coronary artery has sequential severe disease with a      95% stenosis prior to a very small acute marginal vessel and then a      total occlusion.  The small acute marginal is a potential for      ischemia but is small and would not warrant intervention at this      point in time.  5. The saphenous vein graft to the distal right coronary artery      posterior descending is a satisfactory graft with a smooth body of      the graft and nice insertion and good distal runoff.  There are      minor irregularities in the distal right coronary artery bed.  6. The left internal mammary graft to the LAD is widely patent with a      nice insertion and good distal runoff.  7. Saphenous vein graft to the diagonal vessel is widely patent with a  nice insertion and good distal runoff.  8. The sequential saphenous vein graft to the first and second obtuse      marginal is widely patent with a nice insertion and good distal      runoff.  The first obtuse marginal vessel is small but      satisfactory.   OVERALL IMPRESSION:  1. Normal left ventricular function with very minimal anterior      hypokinesia.  2. Severe three-vessel coronary disease with totally occluded right      coronary artery, totally occluded left anterior descending and      totally occluded obtuse marginal of the left circumflex.  3. Patent saphenous vein graft to the right coronary artery/posterior      descending, patent saphenous vein graft to the diagonal, patent      sequential saphenous vein graft to left circumflex system (first OM      - second OM) and a patent left internal mammary graft to the LAD.   DISCUSSION:  Overall, it is felt that Mr. Baca continues to have  satisfactory revascularization.  There is some diffuse and severe  disease to some small branches that perhaps could cause angina,  but they  are not well-suited for percutaneous intervention.      Colleen Can. Deborah Chalk, M.D.  Electronically Signed     SNT/MEDQ  D:  02/11/2007  T:  02/11/2007  Job:  [id number]

## 2011-05-03 NOTE — Discharge Summary (Signed)
[place]. [facility]  Patient:    Joseph Bentley, Joseph Bentley                      MRN: [id number] Adm. Date:  [id number] Disc. Date: 04/07/00 Attending:  Charlett Lango Dictator:   Loura Pardon, P.A. CC:         Salvatore Decent. Dorris Fetch, M.D.             Darden Palmer., M.D.             Hubert L. Evonnie Dawes, M.D. [facility]                           Discharge Summary  DATE OF BIRTH:  08-22-1926.  PHYSICIANS: 1. W. Ashley Royalty., M.D., cardiologist. 2. Mahala Menghini. Evonnie Dawes, M.D. primary care giver.  FINAL DIAGNOSES:  1. Ischemic subendocardial myocardial infarction on this admission.  2. Severe three vessel atherosclerotic coronary artery disease.  3. Atrial fibrillation, persistent, postoperatively requiring transient     anticoagulation sinus rhythm with amiodarone.  4. Seizure disorder.  5. Status post angioplasty in 1987; catheterization with left heart     catheterization in 1993 with no new changes at that time.  6. History of pilonidal cyst.  7. Status post bilateral herniorrhaphies.  8. Status post tonsillectomy and adenoidectomy.  9. Hypertension. 10. Significant family history of premature atherosclerotic coronary artery     disease.  PROCEDURES: 1. March 27, 2000, left heart catheterization, left ventriculogram, and    coronary angiography by Dr. Lacretia Nicks. Viann Fish, Montez Hageman.  This study demonstrated    ejection fraction of 60%, mitral valve normal, left ventricular normal size.    There was calcification seen at the ostium of the right coronary artery as    well as the proximal left coronary artery system and mid-LAD.  Left main    coronary artery has mild narrowing.  There is a 70-80% segmental stenosis in    the midportion of the LAD after a diagonal branch.  There is a segmental 50-60%    stenosis involving the diagonal branch, midportion.  There is moderate LAD    disease distally.  Right coronary artery had a severe 95% proximal stenosis nd  then diffuse disease with tortuosity.  The intermedius coronary artery has    a 60% proximal stenosis.  The right coronary artery has a large marginal    branch with a 30-40% proximal stenosis. 2. March 31, 2000, coronary artery bypass graft surgery x 6 by Dr. Viviann Spare C.    Hendrickson.  In this procedure, the left internal mammary artery was connected    in an end-to-side fashion to the left anterior descending coronary artery,    a reverse saphenous vein graft was fashioned from the aorta to the first    diagonal, a reverse saphenous vein graft was fashioned in a sequential fashion    from the aorta to the first obtuse marginal and then to the second obtuse    marginal, and a reverse saphenous vein graft was fashioned from the aorta    to the distal right coronary artery and then to the posterior descending    coronary artery.  The patient was maintained on IV dopamine in the    intraoperative and postoperative period.  Transferred in stable and satisfactory    condition to the intensive care unit.  DISPOSITION:  Joseph Bentley is going home postoperative day #7,  April 07, 2000. e has experienced no respiratory decompensation and in fact was relieved of all supplemental oxygen by postoperative day #2.  He is ambulating independently. is mental status has been clear in the postoperative period.  He has been afebrile  postoperatively.  He has, however, experienced persistent recurrent atrial fibrillation which started on postoperative day #1.  It has been treated with IV Cardizem at first and then with digoxin, Lopressor, as well as Cardizem.  He was started on Lovenox postoperative day #2, started on Coumadin postoperative day 4, as well as amiodarone postoperative day #4.  His atrial fibrillation persisted until postoperative day #5 when he converted to a sinus rhythm.  He has remained in a sinus rhythm for postoperative day #5 through #7.  Cardizem and digoxin were discontinued on  postoperative day #6 as well as his Coumadin.  He remains in a sinus rhythm going home with amiodarone 200 mg p.o. b.i.d.  He oes home with the following medications.  DISCHARGE MEDICATIONS: 1. Darvocet-N 100 1-2 tablets p.o. q.4-6h. p.r.n. pain. 2. Enteric-coated aspirin 325 mg q.d. 3. Lasix 40 mg q.d. 4. Potassium chloride 20 mEq q.d. 5. Lopressor 50 mg 1/2 tablet q.a.m. and q.p.m. 6. Dilantin 100 mg 2 tablets b.i.d. 7. Amiodarone 200 mg b.i.d.  ACTIVITY:  Ambulation as tolerated.  He is asked not to lift any weight greater  than 10 pounds nor to drive for the next six weeks.  DIET:  Low sodium, low cholesterol diet.  WOUND CARE:  May shower daily keeping his incisions clean and dry.  FOLLOW-UP:  He has a follow-up appointment with Dr. Lacretia Nicks. Viann Fish, Jr. two weeks after this discharge.  He is asked to call [phone] to arrange the appointment.  A chest x-ray will be taken.  He also has another visit with Dr. Viviann Spare C. Hendrickson three weeks after discharge.  Dr. Salvatore Decent. Hendricksons office will call him with that appointment and he is to bring a chest x-ray at hat time.  HISTORY OF PRESENT ILLNESS:  Joseph Bentley is a 77 year old male admitted to [facility] March 25, 2000 with substernal chest pain which he describes as tightness.  It is without radiation.  It occurred the morning of admission after he walked an hour and was showering after his walk.  He did not have actual symptoms with his exertion.  The symptoms were associated with profuse diaphoresis.  He denies any nausea and vomiting, though last for a total of one to two minutes.  He takes a morning aspirin each morning.  He presented to [facility].  HOSPITAL COURSE:  After admission to [facility] through the emergency room for substernal chest pain, he was found to have elevated cardiac  enzymes with a small subendocardial myocardial infarction.  He  has a history of   prior atherosclerotic coronary artery disease and has undergone angioplasty in 987 with left heart catheterization follow-up in 1993.  He was started on heparin and nitroglycerin with stabilization.  He underwent left heart catheterization by Dr. Lacretia Nicks. Viann Fish, Montez Hageman.  The results of that study ad been previously dictated.  The study demonstrated severe three vessel atherosclerotic coronary artery disease.  On heparin and nitroglycerin, he remained stable on April 13th, 14th, and 15th.  He underwent carotid duplex ultrasound on March 27, 2000 which showed no significant internal carotid artery stenosis.  He had palpable pedal pulses.  He was seen by Dr. Viviann Spare C. Dorris Fetch of the  [facility] who recommended revascularization surgery.  The risks and benefits of the surgery were described to Joseph Bentley and he elected to undergo the procedure. This was done on March 31, 2000.  Six bypasses were placed.  As mentioned above, he was taken from the operating room with support of IV dopamine.  He was extubated on the day of surgery and achieved 100% oxygen saturations.  On postoperative day #1, he was achieving 97% oxygen saturation on two liters of nasal cannula.  Hemoglobin was 10.6 and creatinine 0.7.  His heart rhythm did exhibit rapid atrial fibrillation which was rate controlled with a Cardizem drip.  On postoperative day #2, he was achieving 93% oxygen saturations on room air. is atrial fibrillation persisted and he was started on Lovenox.  On postoperative ay #3, he was achieving 98% oxygen saturation on room air.  He was in a sinus rhythm; however, he did lapse into atrial fibrillation later that day despite being on Lopressor and Cardizem.  Digoxin was loaded at that time.  His atrial fibrillation persisted into postoperative day #4, he was started on Coumadin and amiodarone at that time.  Atrial fibrillation  persisted into postoperative day #5.  His hemoglobin was 9.1 and creatinine 0.7.  He did, however, convert into a sinus rhythm later in that day and has remained in sinus rhythm through postoperative days #6 and #7.  On postoperative day #6, digoxin, Cardizem, and Coumadin were discontinued.  He was ready for discharge on postoperative day #7. DD:  04/06/00 TD:  04/06/00 Job: [id number]

## 2011-05-03 NOTE — Cardiovascular Report (Signed)
NAME:  Joseph Bentley, Joseph Bentley NO.:  [id number]   MEDICAL RECORD NO.:  [id number]          PATIENT TYPE:  OIB   LOCATION:  NA                           FACILITY:  [facility]   PHYSICIAN:  Colleen Can. Deborah Chalk, M.D.DATE OF BIRTH:  26-May-1926   DATE OF PROCEDURE:  01/04/2005  DATE OF DISCHARGE:                              CARDIAC CATHETERIZATION   HISTORY:  Joseph Bentley has had previous coronary artery bypass grafting in  April 2001.  He has had recurrent chest pain.  He has atrial fibrillation.  He is on chronic Coumadin anticoagulation.  He is referred for evaluation of  coronary anatomy.   PROCEDURE:  Left heart catheterization with selective coronary angiography,  left ventriculography, saphenous vein graft angiography x 3, and angiography  of the left internal mammary artery.   TYPE AND SITE OF ENTRY:  Percutaneous right femoral artery.   CATHETERS:  6 French 4 curved Judkins right and left coronary catheters, 6  French pigtail ventriculogram catheter, 6 French right coronary bypass graft  catheter.   CONTRAST MATERIAL:  Omnipaque.   MEDICATIONS GIVEN PRIOR TO THE PROCEDURE:  Valium 10 mg p.o.   MEDICATIONS GIVEN DURING THE PROCEDURE:  Ancef 1 gram IV.   COMMENTS:  The patient tolerated the procedure well.   HEMODYNAMIC DATA:  The aortic pressure was 134/83, LV 130/5-10, there is no  aortic valve gradient noted on pull back.   ANGIOGRAPHIC DATA:  Left ventricular angiogram was performed in the RAO position.  The overall  cardiac size and silhouette were normal.  The global ejection fraction is  55%.  There is very mild anterior wall hypokinesia.   1.  Saphenous vein graft to the distal right coronary artery is widely      patent with a satisfactory insertion site.  There is excellent flow to      the right coronary artery distribution although there is multiple      irregularities distally.  There is some degree of bidirectional fluid to      the distal right  coronary artery.   1.  Left internal mammary artery to the LAD is widely patent with a nice      insertion and good distal run off.   1.  Saphenous vein graft to the second diagonal vessel is widely patent with      a nice insertion and good distal run off.   1.  Sequential saphenous vein graft to the first and second obtuse marginal      is widely patent with a nice insertion and good distal run off.  The      first obtuse marginal is a very small vessel but it does appear to have      satisfactory insertion and has TIMI Grade III flow.   1.  Native right coronary artery has sequential severe disease.  It has      totally occluded.  There is some slight bidirectional flow distally.      There is sequential 95+ percent lesions and there could possibly be some      ischemia on the  acute marginal vessel although it would be minimal and      very difficult to consider percutaneous intervention that could result      in any degree of improvement.   1.  Left main coronary artery has a 50% ostial narrowing.   1.  Left anterior descending is totally occluded after a first diagonal      vessel.  The first diagonal vessel is diffusely diseased in its proximal      30 mm.  There appears to be a septal perforating branch arising off this      branch at  this location.  This is totally occluded distally.  The      central perforating branch could possibly be ischemic because of the      severe stenosis before and afterwards.  The distal portion of this      vessel appears to be supplied by a bypass graft.   1.  Left circumflex.  The left circumflex has a totally occluded first      obtuse marginal.  The second obtuse marginal has 90% ostial stenosis and      then has bidirectional flow from a bypass graft.   IMPRESSION:  1.  Essentially normal left ventricular function with very minimal anterior      hypokinesia.  2.  Severe three vessel coronary artery disease with totally occluded right       coronary artery, totally occluded left anterior descending, totally      occluded obtuse marginal of the left circumflex.  3.  Patent saphenous vein graft to right coronary artery, diagonal system,      and sequential graft to the left circumflex with a patent left internal      mammary graft to the LAD.   DISCUSSION:  Overall, Joseph Bentley has adequate revascularization and his  bypass grafts are patent.  There are some small areas proximal to the grafts  that, perhaps, have some compromise into small branches because of severe  sequential and diffuse disease but these areas would not be suitable for  percutaneous intervention.  It is felt that he is best managed medically at  this point of time.   ADDENDUM:  Angioseal was performed.       SNT/MEDQ  D:  01/04/2005  T:  01/04/2005  Job:  [id number]   cc:   Joseph Bentley. Joseph Bentley, M.D. [facility]

## 2011-05-03 NOTE — Consult Note (Signed)
NAME:  Joseph Bentley, Joseph Bentley                         ACCOUNT NO.:  [id number]   MEDICAL RECORD NO.:  [id number]                   PATIENT TYPE:  OBV   LOCATION:  3731                                 FACILITY:  [facility]   PHYSICIAN:  Marlan Palau, M.D.               DATE OF BIRTH:  02/17/1926   DATE OF CONSULTATION:  01/04/2004  DATE OF DISCHARGE:                                   CONSULTATION   HISTORY OF PRESENT ILLNESS:  Joseph Bentley is a 77 year old white male  born [birth year] with a history of seizures since age 5. The patient  had been seen previously by Dr. Tomasa Rand from our group. This patient has  been very well controlled on Dilantin, taking 200 mg twice a day. The  patient has had a upper respiratory tract infection and was placed on  Zithromax. He has been taking decongestants for the problem. The patient had  two to three days of diarrhea prior to admission and on the day of admission  began having generalized seizure events lasting 5 to10 minutes. The patient  had a total of 3 events, came to the emergency room, Dilantin level was  10.3. The patient was given a Dilantin 500 mg IV x1 dose. The patient was  postictal but recovered nicely. The patient was admitted for further  evaluation. CT of the head performed showed no acute process. It was some  limited due to excessive movement artifact.   PAST MEDICAL HISTORY:  1. History of seizure disorder since age 41 with recent recurrence.  2. Coronary artery disease status post CABG procedure x3.  3. Pilonidal cyst.  4. History of atrial fibrillation on Coumadin.  5. History of bilateral hernia repair.  6. Tonsillectomy.  7. Hypertension.  8. Hypercholesterolemia.   MEDICATIONS PRIOR TO ADMISSION:  1. Dilantin 200 mg b.i.d.  2. Coumadin 6 mg a day.  3. Fosamax 70 mg a week.  4. Lipitor 80 mg a day.  5. Toprol-XL 100 mg a day.  6. Diovan 80 mg a day, but this had been changed to Avapro 150 mg daily. The  patient had been on Zithromax prior to admission.   ALLERGIES:  The patient has no known allergies. Does not currently smoke or  drink.   SOCIAL HISTORY:  The patient is retired, lives with his wife and lives in  the [place] area.   FAMILY HISTORY:  Notable for heart disease and hypertension. Otherwise  unremarkable.   REVIEW OF SYSTEMS:  Notable for no headaches, shortness of breath, chest  pain. The patient was thought to have some bronchitis prior to this  admission. Has had sinus tenderness. Denies any numbness or weakness of the  face, arms, or legs.   PHYSICAL EXAMINATION:  VITAL SIGNS:  Blood pressure 127/79, heart rate 76,  respiratory rate 18, temperature afebrile.  GENERAL:  This patient is a fairly well developed white male  who is alert  and cooperative at the time of examination.  HEENT EXAMINATION:  Head is atraumatic. Eyes:  Pupils are round and reactive  to light. Disks are flat bilaterally.  NECK:  Supple. No carotid bruits noted.  RESPIRATORY EXAMINATION:  Clear.  CARDIOVASCULAR EXAMINATION:  Reveals regular rate and rhythm. No obvious  rubs or murmurs noted.  EXTREMITIES:  Without significant edema.  NEUROLOGICAL EXAMINATION:  Cranial nerves as above. Facial symmetry is  present. The patient has good sensation in the face to pinprick and soft  touch bilaterally. He has good strength in facial muscles, muscles of head  turning and shoulder shrug bilaterally. Speech is well enunciated and not  aphasic. Motor testing reveals 5/5 strength in all fours. Good symmetric  motor tone is noted throughout. Sensory testing is intact to pinprick, soft  touch, vibratory sensation throughout. The patient has fair finger-nose-  finger toe-to-finger bilaterally. Gait was not tested. Deep tendon reflexes  were depressed but symmetric.  Toes neutral bilaterally.   LABORATORY VALUES:  Notable for Dilantin level of 13.3. Sodium 136,  potassium 4.2, chloride 103, CO2 25, BUN 12,  creatinine 0.9, glucose 105.  Total bilirubin 0.5, alkaline phosphatase 150, SGOT 50, SGPT 40, total  protein 6.6, albumin 3.5, calcium 8.8. Hemoglobin of 16.2, hematocrit 46.7,  white count 8.4, platelets 262. INR is 1.4.   CT of the head is as above.   IMPRESSION:  1. History of chronic seizure disorder with recent recurrence.  2. Recent bout of upper respiratory tract infection, bronchitis.   The patient had been on decongestant prior to this admission and had been on  antibiotics. The patient has a low therapeutic Dilantin level on admission.  Could potentially require an increase in maintenance dose in the future. At  this time, however, the patient is cleared for discharge to home. Would keep  on 400 mg of Dilantin a day but recheck the Dilantin level in 7 to 10 days  following discharge. The patient is to have an increase in maintenance dose  if the level is less than 14. The patient may have another 30 mg capsule  added to his daily dose or 50 mg pediatric tablet added to the maintenance  daily and have the Dilantin level rechecked if the increase in maintenance  dose is required. The patient can follow up with [facility] on an as needed basis.                                               Marlan Palau, M.D.    CKW/MEDQ  D:  01/04/2004  T:  01/05/2004  Job:  [id number]

## 2011-05-03 NOTE — Cardiovascular Report (Signed)
[place]. [facility]  Patient:    Joseph Bentley, Joseph Bentley                      MRN: [id number] Proc. Date: 03/27/00 Adm. Date:  [id number] Attending:  Dolores Patty CC:         Titus Dubin. Alwyn Ren, M.D. [facility]             [employer]. Deborah Chalk, M.D.             Thomas A. Patty Sermons, M.D.                        Cardiac Catheterization  HISTORY:  A 77 year old male admitted with diaphoresis and ischemic subendocardial infarction.  COMMENTS ABOUT PROCEDURE:  The patient tolerated the procedure well without complications and good hemostasis and pedal pulses following the procedure. The anatomy was felt to be borderline for intervention, and it was opted to study the case and then make further recommendations for bypass versus spot angioplasty and stenting following the case.  HEMODYNAMIC DATA:  Aortic post contrast 131/65.  LV post contrast 131/8.  ANGIOGRAPHIC DATA:  Left ventriculogram:  Performed in the 30-degree RAO projection.  Aortic valve was normal.  The mitral valve was normal.  The left ventricle appears normal in size.  Ejection fraction is estimated at 60%.  The coronary arteries arise and distribute normally.  Calcification is seen at the ostium of the right coronary artery as well as the proximal left coronary system and mid LAD. 1. The left main coronary artery has mild narrowing. 2. There is mild narrowing in the proximal LAD.  There is a 70-80% segmental    stenosis in the mid portion of the LAD after the diagonal branch which is    calcified and involves the origin of the second septal perforator as well    as the diagonal branch.  There is segmental 50-60% stenosis involving the    diagonal branch in its mid portion.  There is moderate LAD disease    distally. 3. The right coronary artery has a severe 95% proximal stenosis and then is    diffusely diseased and somewhat tortuous.  There is a moderately severe    ulcerated stenosis in the mid  portion of the right coronary artery.  It    supplies a small posterior descending and posterolateral branch.  The    intermedius coronarynary artery has a 60% proximal stenosis.  The right    coronary artery has a large marginal branch and has mild 30-40% proximal    stenosis.  IMPRESSION: 1. Normal LV function. 2. Significant two-vessel coronary artery disease involving the LAD and the    right coronary artery.  RECOMMENDATIONS:  Review of old films.  Consideration of bypass grafting versus spot angioplasty and stenting of the right coronary artery although risks of this are somewhat increased. DD:  03/27/00 TD:  03/27/00 Job: [id number]

## 2011-05-03 NOTE — Procedures (Signed)
[place]. [facility]  Patient:    Joseph Bentley, Joseph Bentley Visit Number: [id number] MRN: [id number]          Service Type: END Location: ENDO Attending Physician:  Charna Elizabeth Dictated by:   Anselmo Rod, M.D. Proc. Date: 07/09/02 Admit Date:  07/09/2002   CC:         Rodrigo Ran, M.D.   Procedure Report  DATE OF BIRTH:  24-Aug-1926  PROCEDURE PERFORMED:  Colonoscopy.  ENDOSCOPIST:  Anselmo Rod, M.D.  INSTRUMENT USED:  Olympus video colonoscope.  INDICATION FOR PROCEDURE:  Occasional blood in stool in a 77 year old white male.  Rule out colonic polyps, masses, hemorrhoids, etc.  PREPROCEDURE PREPARATION:  Informed consent was procured from the patient. The patient was fasted for 8 hours prior to the procedure and prepped with a bottle of magnesium citrate and a gallon of NuLytely the night prior to the procedure.  PREPROCEDURE PHYSICAL:  The patient has stable vital signs.  NECK: Supple.  CHEST:  Clear to auscultation. S1, S2 regular.  ABDOMEN:  Soft with normal bowel sounds.  DESCRIPTION OF PROCEDURE:  The patient was placed in the left lateral decubitus position and sedated with 30 mg of Demerol and 3 mg of Versed intravenously.  Once the patient was adequately sedated and maintained on low-flow oxygen and continuous cardiac monitoring, the Olympus video colonoscope was advanced from the rectum to the cecum with difficulty.  The patients position had to be changed from the left lateral to the supine and the right lateral position to adequately visualize the colonic mucosa.  The appendiceal orifice and the ileocecal valve were clearly visualized.  Small, nonbleeding internal hemorrhoids were seen on retroflexion in the rectum.  The patient had a very tortuous colon, but no masses, polyps, erosions, ulcerations or diverticula were seen.  IMPRESSION:  A healthy-appearing colon except for small nonbleeding  internal hemorrhoids.  RECOMMENDATIONS: 1. Repeat colorectal cancer screening was recommended in the next 10 years    unless the patient develops any abnormal symptoms in the interim. 2. A high-fiber diet has been advised. 3. Outpatient follow-up in the next 10 years or earlier if need be. Dictated by:   Anselmo Rod, M.D. Attending Physician:  Charna Elizabeth DD:  07/09/02 TD:  07/12/02 Job: [id number]

## 2011-05-03 NOTE — Op Note (Signed)
[place]. [facility]  Patient:    Joseph Bentley, Joseph Bentley                        MRN: [id number] Proc. Date: 03/31/00 Attending:  Salvatore Decent. Dorris Fetch, M.D. CC:         Darden Palmer., M.D.             Titus Dubin. Alwyn Ren, M.D. [facility]                           Operative Report  PREOPERATIVE DIAGNOSIS:  Three-vessel coronary disease.  POSTOPERATIVE DIAGNOSIS:  Three-vessel coronary disease.  OPERATION PERFORMED:  Median sternotomy, extracorporeal circulation, coronary artery bypass grafting x 6 (left internal mammary artery to left anterior descending, saphenous vein graft to first diagonal, sequential saphenous vein graft to obtuse marginal 1 and 3, sequential saphenous vein graft to distal right coronary artery and posterior descending).  SURGEON:  Salvatore Decent. Dorris Fetch, M.D.  ASSISTANT: 1. Gwenith Daily. Tyrone Sage, M.D. 2. Sherrie George, P.A.  ANESTHESIA:  General.  OPERATIVE FINDINGS:  Good quality conduits.  Left anterior descending diffusely diseased, fair quality coronary.  Posterior descending fair quality. Remaining coronaries good quality.  CLINICAL NOTE:  The patient is a 77 year old gentleman who presented with unstable angina.  He had a prior history of coronary disease having previously undergone PTCA of his LAD.  The patient was admitted and ruled out for MI.  He underwent cardiac catheterization which revealed severe three-vessel coronary disease with overall preserved left ventricular function.  The patient was referred for coronary artery bypass grafting.  The indications, risks, benefits and alternative treatments were discussed in detail with the patient per outline in the patients chart.  He understood these issues and agreed to proceed.  DESCRIPTION OF PROCEDURE:  Joseph Bentley was brought to the preop holding area on March 31, 2000.  Lines were placed to monitor arterial, central venous and pulmonary arterial pressure.  EKG leads were  placed for continuous telemetry. The patient was taken to the operating room, anesthetized and intubated.  A Foley catheter was placed, intravenous antibiotics were administered.  The chest abdomen and legs were prepped and draped in the usual fashion.  A median sternotomy was performed.  Simultaneously, an incision was made in the medial aspect of the leg and the greater saphenous vein was harvested in the standard fashion.  The greater saphenous vein was of good quality.  The left internal mammary artery also was a good quality conduit.  It was dissected from the chest wall using standard technique.  The patient was fully heparinized prior to dividing the distal end of the mammary artery.  There was good flow through the cut end of the vessel.  The mammary was placed in a papaverine soaked sponge and placed into the left pleural space.  The pericardium was opened.  The ascending aorta was inspected and palpated. There was no palpable atherosclerotic disease.  After assuring adequate ACT, the aorta was cannulated via concentric 2-0 Ethibond pledgeted pursestring suture.  Dual stage venous cannula was placed via pursestring suture in the right atrial appendage.  Cardiopulmonary bypass was instituted and the patient was cooled to 32 degrees.  The coronary arteries were inspected.  Anastomotic sites were chosen.  Conduits were inspected and cut to length.  A foam pad was placed in the pericardium to protect the left phrenic nerve and a temperature probe was placed in  the myocardial septum.  A cardioplegia cannula was placed in the ascending aorta.  The aorta was crossclamped.  The left ventricle was emptied via the aortic root vent.  Cardiac arrest was achieved with a combination of cold antegrade cardioplegia and topical iced saline.  After achieving complete diastolic arrest, and myocardial septal temperature of 10 degrees Celsius, the following distal anastomoses were  performed.  First a reversed saphenous vein graft was placed sequentially to the distal right and posterior descending coronary arteries.  The original operative plan had been to place a single graft in the distal right coronary which appeared relatively normal on cath.  However, there was visible atherosclerotid disease both at the bifurcation as well as in the proximal posterior descending coronary artery. It was elected not to graft in this area but to place sequential grafts in the distal right beyond the take of the PDA as well as in the proximal PDA beyond the disease.  A saphenous vein graft was anastomosed side-to-side to the distal right coronary.  The venotomy was oriented perpendicularly to the arteriotomy forming a diamond-shaped anastomosis.  The anastomosis was performed with a running 7-0 Prolene suture.  It was probed proximally and distally to ensure patency.  The suture was tied.  There was good flow through this anastomosis.  The proximal PDA then was incised and an end-to-side anastomosis of the vein graft to the PDA was performed.  The PDA was a 1.5 mm very thin-walled coronary.  The anastomosis was performed and an attempt to pass a probe distal to the anastomosis was unsuccessful with concern of suture having caught the back wall of the artery during the anastomosis.  The suture was removed.  The anastomosis was once again performed with a running 7-0 Prolene suture.  This time the probe passed easily both proximal and distal to the anastomosis.  This suture was tied. There was good flow through the anastomosis.  Cardioplegia was administered down the anastomosis and there was good hemostasis at both sites.  Next, a reversed saphenous vein graft was placed, end-to-side to the first diagonal branch of the LAD.  The first diagonal was a 1.5 mm good quality coronary at the site of anastomosis.  It was heavily diseased proximal to the anastomosis.  The vein graft was of  good quality.  The anastomosis was performed with a running 7-0 Prolene suture.  Again it was probed proximally  and distally to ensure patency.  There was good flow through the anastomosis with flushing of cold heparinized saline.  Cardioplegia was administered and there was good hemostasis.  Next a reversed saphenous vein was placed sequentially to the first and third obtuse marginal branches of the left circumflex coronary artery.  The first obtuse marginal branch was rather small-appearing on catheterization.  It was an intramyocardial vessel but was 1.5 mm in diameter.  The vein graft was anastomosed side-to-side to the first obtuse marginal using a running 7-0 Prolene suture.  Again, a probe was passed proximally and distally.  Again, there was good flow through the anastomosis when flushed.  An end-to-side anastomosis then was performed to the third obtuse marginal marginal branch. OM2 and 3 arose as a common artery deep in the AV groove and then bifurcated. OM3 was larger of the two branches which communicated with each other and therefore was chosen to graft.  It was still only 1.3 mm in diameter.  The anastomosis was performed using a running 7-0 Prolene suture.  The anastomosis probed easily  with a 1.5 mm probe proximally and a 1.0 mm probe distally. Again, there was good flow down the graft.  Cardioplegia was administered. There was some bleeding from the myocardium at the site of the OM1 anastomosis but no anastomotic bleeding itself.  This was controlled with a 7-0 Prolene figure-of-eight suture.  Next, the left internal mammary artery was brought through a window in the pericardium anterior to the left phrenic nerve.  The distal end was spatulated and anastomosed end-to-side to the distal LAD, which was a 1.5 mm fair quality coronary.  There was posterior plaque throughout.  The anastomosis was performed with a running 8-0 Prolene suture.  The mammary artery was of  good quality.  At the completion of the mammary to LAD anastomosis the bulldog clamp was removed from the mammary artery.  Immediate and rapid septal rewarming was noted.  The mammary pedicle was tacked to the epicardial surface of the heart with 6-0 Prolene sutures.  Lidocaine was administered and the aortic crossclamp was removed.  Total crossclamp time was 85 minutes.  A partial occlusion clamp was placed on the ascending aorta.  The cardioplegia cannula was removed.  A total of three defibrillations of 20 joules were required before the patient resumed sinus rhythm.  The vein grafts were cut to length and were anastomosed to 4.4 mm punch aortotomies with running 6-0 Prolene sutures.  At the completion of the final proximal anastomosis the patient was placed in Trendelenburg position.  The partial clamp was slowly removed allowing air to vent through the anastomosis before tying the suture. Air was then aspirated from each of the vein grafts, the bulldog clamps were removed and flow was restored.  All proximal and distal anastomoses were inspected for hemostasis.  Epicardial pacing wires were placed off the right ventricle and right atrium and the patient was weaned from cardiopulmonary bypass when he had been rewarmed to 37 degrees Celsius.  Total bypass time was 170 minutes.  The patient was on no inotropic support at the time of separation from bypass, was ____________ paced.  A test dose of protamine was administered and was well tolerated.  The atrial and aortic cannulae were removed.  There was good hemostasis at both cannulation sites.  Of note the initial cardiac output postbypass was 5L per minute.  The remainder of the protamine was administered without incident. The chest was irrigated with one liter of warm normal saline containing 1 gm of vancomycin.  Hemostasis was achieved.  A left pleural and two mediastinal chest tubes were placed through separate subcostal incisions.   The pericardium was reapproximated over the aorta and base of the heart with interrupted 3-0 silk sutures.  The sternum was closed with stainless steel wires.  The pectoralis fascia was closed with a running #1 Vicryl suture.  The subcutaneous tissue was closed with running 2-0 Vicryl suture and the skin was closed with 3-0 Vicryl subcuticular suture.  All sponge, needle and instrument counts were correct at the end of the procedure.  At this point the procedure was completed.  The patient then developed hypotension with a systolic pressure in the 70s requiring institution of domamine drip and also had a cardiac output of three liters per minute.  The patient was prepared and at that point I rescrubbed.  The skin, subcutaneous tissues and pectoralis fascia were reopened.  At that point, the patient had recovery of his blood pressures.  Cardiac output came back up to 5L per minute.  Dopamine drip  was weaned back to renal dose at 5 cc per hour or approximately 3 mcg/kg.  The patient continued to have good cardiac outputs and stable blood pressure.  The patient was observed for quite some time.  The wound then once again was irrigated with warm saline containing vancomycin.  The pectoralis fascia was closed with running #1 Vicryl suture.  The subcutaneous tissue was closed with running 2-0 Vicryl suture and the skin was closed with a 3-0 Vicryl subcuticular suture.  The patient remained hemodynamically stable and was taken from the operating room to the surgical intensive care unit. DD:  03/31/00 TD:  04/02/00 Job: [id number]

## 2011-05-17 HISTORY — PX: BACK SURGERY: SHX140

## 2011-07-02 ENCOUNTER — Emergency Department (HOSPITAL_COMMUNITY): Payer: Medicare Other

## 2011-07-02 ENCOUNTER — Encounter (HOSPITAL_COMMUNITY): Payer: Self-pay

## 2011-07-02 ENCOUNTER — Emergency Department (HOSPITAL_COMMUNITY)
Admission: EM | Admit: 2011-07-02 | Discharge: 2011-07-02 | Disposition: A | Discharge status: Home / Self Care | Payer: Medicare Other | Attending: Emergency Medicine | Admitting: Emergency Medicine

## 2011-07-02 DIAGNOSIS — Y92009 Unspecified place in unspecified non-institutional (private) residence as the place of occurrence of the external cause: Secondary | ICD-10-CM | POA: Insufficient documentation

## 2011-07-02 DIAGNOSIS — Z951 Presence of aortocoronary bypass graft: Secondary | ICD-10-CM | POA: Insufficient documentation

## 2011-07-02 DIAGNOSIS — Y998 Other external cause status: Secondary | ICD-10-CM | POA: Insufficient documentation

## 2011-07-02 DIAGNOSIS — S20229A Contusion of unspecified back wall of thorax, initial encounter: Secondary | ICD-10-CM | POA: Insufficient documentation

## 2011-07-02 DIAGNOSIS — I2510 Atherosclerotic heart disease of native coronary artery without angina pectoris: Secondary | ICD-10-CM | POA: Insufficient documentation

## 2011-07-02 DIAGNOSIS — I10 Essential (primary) hypertension: Secondary | ICD-10-CM | POA: Insufficient documentation

## 2011-07-02 DIAGNOSIS — Z79899 Other long term (current) drug therapy: Secondary | ICD-10-CM | POA: Insufficient documentation

## 2011-07-02 DIAGNOSIS — G40909 Epilepsy, unspecified, not intractable, without status epilepticus: Secondary | ICD-10-CM | POA: Insufficient documentation

## 2011-07-02 DIAGNOSIS — W010XXA Fall on same level from slipping, tripping and stumbling without subsequent striking against object, initial encounter: Secondary | ICD-10-CM | POA: Insufficient documentation

## 2011-07-02 DIAGNOSIS — Z7901 Long term (current) use of anticoagulants: Secondary | ICD-10-CM | POA: Insufficient documentation

## 2011-07-02 HISTORY — DX: Epilepsy, unspecified, not intractable, without status epilepticus: G40.909

## 2011-07-02 HISTORY — DX: Essential (primary) hypertension: I10

## 2011-07-02 HISTORY — DX: Atherosclerotic heart disease of native coronary artery without angina pectoris: I25.10

## 2011-07-02 LAB — PROTIME-INR
INR: 2.87 — ABNORMAL HIGH (ref 0.00–1.49)
Prothrombin Time: 30.5 seconds — ABNORMAL HIGH (ref 11.6–15.2)

## 2011-07-04 ENCOUNTER — Emergency Department (HOSPITAL_COMMUNITY): Payer: Medicare Other

## 2011-07-04 ENCOUNTER — Inpatient Hospital Stay (HOSPITAL_COMMUNITY)
Admission: EM | Admit: 2011-07-04 | Discharge: 2011-07-11 | DRG: 479 | Disposition: A | Discharge status: Skilled Nursing Facility | Payer: Medicare Other | Attending: Internal Medicine | Admitting: Internal Medicine

## 2011-07-04 DIAGNOSIS — E291 Testicular hypofunction: Secondary | ICD-10-CM | POA: Diagnosis present

## 2011-07-04 DIAGNOSIS — W010XXA Fall on same level from slipping, tripping and stumbling without subsequent striking against object, initial encounter: Secondary | ICD-10-CM | POA: Diagnosis present

## 2011-07-04 DIAGNOSIS — Z8673 Personal history of transient ischemic attack (TIA), and cerebral infarction without residual deficits: Secondary | ICD-10-CM

## 2011-07-04 DIAGNOSIS — R269 Unspecified abnormalities of gait and mobility: Secondary | ICD-10-CM | POA: Diagnosis present

## 2011-07-04 DIAGNOSIS — Y92009 Unspecified place in unspecified non-institutional (private) residence as the place of occurrence of the external cause: Secondary | ICD-10-CM

## 2011-07-04 DIAGNOSIS — I10 Essential (primary) hypertension: Secondary | ICD-10-CM | POA: Diagnosis present

## 2011-07-04 DIAGNOSIS — I4891 Unspecified atrial fibrillation: Secondary | ICD-10-CM | POA: Diagnosis present

## 2011-07-04 DIAGNOSIS — G40909 Epilepsy, unspecified, not intractable, without status epilepticus: Secondary | ICD-10-CM | POA: Diagnosis present

## 2011-07-04 DIAGNOSIS — M5137 Other intervertebral disc degeneration, lumbosacral region: Secondary | ICD-10-CM | POA: Diagnosis present

## 2011-07-04 DIAGNOSIS — Z951 Presence of aortocoronary bypass graft: Secondary | ICD-10-CM

## 2011-07-04 DIAGNOSIS — D472 Monoclonal gammopathy: Secondary | ICD-10-CM | POA: Diagnosis present

## 2011-07-04 DIAGNOSIS — G4733 Obstructive sleep apnea (adult) (pediatric): Secondary | ICD-10-CM | POA: Diagnosis present

## 2011-07-04 DIAGNOSIS — I2510 Atherosclerotic heart disease of native coronary artery without angina pectoris: Secondary | ICD-10-CM | POA: Diagnosis present

## 2011-07-04 DIAGNOSIS — K5900 Constipation, unspecified: Secondary | ICD-10-CM | POA: Diagnosis present

## 2011-07-04 DIAGNOSIS — M51379 Other intervertebral disc degeneration, lumbosacral region without mention of lumbar back pain or lower extremity pain: Secondary | ICD-10-CM | POA: Diagnosis present

## 2011-07-04 DIAGNOSIS — S32009A Unspecified fracture of unspecified lumbar vertebra, initial encounter for closed fracture: Principal | ICD-10-CM | POA: Diagnosis present

## 2011-07-04 DIAGNOSIS — G609 Hereditary and idiopathic neuropathy, unspecified: Secondary | ICD-10-CM | POA: Diagnosis present

## 2011-07-04 DIAGNOSIS — M810 Age-related osteoporosis without current pathological fracture: Secondary | ICD-10-CM | POA: Diagnosis present

## 2011-07-04 LAB — URINE MICROSCOPIC-ADD ON

## 2011-07-04 LAB — URINALYSIS, ROUTINE W REFLEX MICROSCOPIC
Glucose, UA: NEGATIVE mg/dL
Protein, ur: 30 mg/dL — AB
pH: 6 (ref 5.0–8.0)

## 2011-07-04 LAB — PHENYTOIN LEVEL, TOTAL: Phenytoin Lvl: <2.5 ug/mL — ABNORMAL LOW (ref 10.0–20.0)

## 2011-07-05 ENCOUNTER — Inpatient Hospital Stay (HOSPITAL_COMMUNITY): Payer: Medicare Other

## 2011-07-05 LAB — POCT I-STAT, CHEM 8
Creatinine, Ser: 0.9 mg/dL (ref 0.50–1.35)
Hemoglobin: 16.3 g/dL (ref 13.0–17.0)
Sodium: 135 mEq/L (ref 135–145)
TCO2: 26 mmol/L (ref 0–100)

## 2011-07-05 LAB — COMPREHENSIVE METABOLIC PANEL
Albumin: 3 g/dL — ABNORMAL LOW (ref 3.5–5.2)
Alkaline Phosphatase: 110 U/L (ref 39–117)
BUN: 17 mg/dL (ref 6–23)
Chloride: 97 mEq/L (ref 96–112)
Glucose, Bld: 119 mg/dL — ABNORMAL HIGH (ref 70–99)
Potassium: 4.2 mEq/L (ref 3.5–5.1)
Total Bilirubin: 0.7 mg/dL (ref 0.3–1.2)

## 2011-07-05 LAB — DIFFERENTIAL
Basophils Absolute: 0 10*3/uL (ref 0.0–0.1)
Basophils Relative: 0 % (ref 0–1)
Eosinophils Relative: 2 % (ref 0–5)
Lymphocytes Relative: 11 % — ABNORMAL LOW (ref 12–46)
Monocytes Absolute: 1.3 10*3/uL — ABNORMAL HIGH (ref 0.1–1.0)
Neutro Abs: 6.3 10*3/uL (ref 1.7–7.7)

## 2011-07-05 LAB — CBC
HCT: 41.9 % (ref 39.0–52.0)
MCHC: 34.6 g/dL (ref 30.0–36.0)
RDW: 14.1 % (ref 11.5–15.5)
WBC: 8.7 10*3/uL (ref 4.0–10.5)

## 2011-07-05 MED ORDER — IOHEXOL 300 MG/ML  SOLN
100.0000 mL | Freq: Once | INTRAMUSCULAR | Status: AC | PRN
  Administered 2011-07-05: 100 mL via INTRAVENOUS

## 2011-07-05 MED ORDER — IOHEXOL 300 MG/ML  SOLN: 100.0000 mL | Freq: Once | INTRAMUSCULAR | Status: AC | PRN

## 2011-07-06 LAB — CBC
MCH: 32 pg (ref 26.0–34.0)
MCHC: 34.2 g/dL (ref 30.0–36.0)
Platelets: 169 10*3/uL (ref 150–400)
RBC: 4.66 MIL/uL (ref 4.22–5.81)
RDW: 14 % (ref 11.5–15.5)

## 2011-07-06 LAB — BASIC METABOLIC PANEL
BUN: 14 mg/dL (ref 6–23)
Calcium: 8.6 mg/dL (ref 8.4–10.5)
Creatinine, Ser: 0.77 mg/dL (ref 0.50–1.35)
GFR calc Af Amer: >60 mL/min (ref >60)
GFR calc non Af Amer: >60 mL/min (ref >60)
Potassium: 4.1 mEq/L (ref 3.5–5.1)

## 2011-07-06 LAB — PROTIME-INR: Prothrombin Time: 21.9 seconds — ABNORMAL HIGH (ref 11.6–15.2)

## 2011-07-07 ENCOUNTER — Observation Stay (HOSPITAL_COMMUNITY): Payer: Medicare Other

## 2011-07-08 NOTE — H&P (Signed)
NAME:  Joseph Bentley, Joseph Bentley NO.:  [id number]  MEDICAL RECORD NO.:  [id number]  LOCATION:  [facility]                         FACILITY:  [facility]  PHYSICIAN:  [name] Baars, M.D.  DATE OF BIRTH:  1926-10-20  DATE OF ADMISSION:  07/04/2011 DATE OF DISCHARGE:                             HISTORY & PHYSICAL   PRIMARY CARE PHYSICIAN:  Mark A. Perini, M.D.  PRIMARY NEUROLOGIST:  Marlan Palau, M.D.  CHIEF COMPLAINT:  Unable to walk due to right leg pain.  HISTORY OF PRESENT ILLNESS:  Joseph Bentley is an 77 year old white male with a history of atrial fibrillation, on anticoagulation; peripheral neuropathy and gait instability with recurrent falls, who presented to the Emergency Department with complaint of right leg pain.  The patient and his wife report a progressive right leg weakness and gait instability over the past 2 years in the setting of his peripheral neuropathy.  His wife attributes his right leg weakness to prior reaction to Lipitor, but the weakness has persisted despite discontinuation of all statins.  Over the past few weeks, she has noticed that the patient's gait has become more unstable resulting in multiple falls.  He fell 2 days ago and presented to the Emergency Department where he underwent an evaluation with a CT of the head and the C-spine, which were negative and lumbosacral x-rays, which were also negative.  He was discharged home, but since then he has continued to have severe right thigh pain and inability to ambulate due to pain with weightbearing.  His wife states she is unable to care for him at home at this point as he cannot walk on his own.  They have to have the neighbors help to pick him up and take him to the bathroom.  He reports minimal back and pelvis pain.  He describes the pain as a bone pain in the right mid thigh.  No associated swelling or redness.  He was brought back to the Emergency Department this evening due to the pain where  his right hip x-ray is negative.  He is being admitted for further management.  REVIEW OF SYSTEMS:  All systems reviewed with the patient are negative except in HPI.Marland Kitchen  PAST MEDICAL HISTORY: 1. Atrial fibrillation, on anticoagulation. 2. Gait instability. 3. Seizure disorder, followed by Dr. Anne Hahn 4. Peripheral neuropathy. 5. Hypertension. 6. Coronary artery disease, status post CABG. 7. Obstructive sleep apnea. 8. MGUS. 9. History of left basal ganglia stroke. 10.History of L1 compression fracture. 11.Hypogonadism, on IM testosterone.  CURRENT MEDICATIONS: 1. Coumadin 5 mg daily. 2. Dilantin 200 mg b.i.d. 3. Diovan 80 mg daily. 4. Protonix 40 mg daily. 5. B12 orally. 6. Fish oil daily. 7. Vitamin D 50,000 units weekly. 8. Metoprolol XL 100 mg daily. 9. Depo-Testosterone 300 mg every 3 weeks.  ALLERGIES: 1. ALTACE. 2. Z-PAK. 3. LIPITOR. 4. CRESTOR.  SOCIAL HISTORY:  This is a second marriage.  He has one son and one stepdaughter.  He has a seven-year smoking history, but quit in the 1960s.  Retired Tree surgeon.  FAMILY HISTORY:  Father died of an MI at 38.  Mother died at 42 of an MI as well.  He  had two brothers with an MI.  PHYSICAL EXAMINATION:  VITAL SIGNS:  Temperature 98.5, blood pressure 144/72, pulse 97, respirations 19, oxygen saturation 95% on room air. GENERAL:  Chronically ill elderly gentleman, lying comfortably in bed. HEENT:  Oropharynx is moist.  No scleral icterus.  Pupils are equal, round and reactive to light.  Extraocular movements intact. NECK:  Supple without lymphadenopathy, JVD or carotid bruits. HEART:  Irregularly irregular. LUNGS:  Clear to auscultation bilaterally. ABDOMEN:  Soft, nondistended, nontender with normoactive bowel sounds. EXTREMITIES:  No clubbing, cyanosis or edema.  He does have mild tenderness over the right mid/distal femur.  Full range of motion in his right knee and right hip.  Negative straight leg raise.  He  does have atrophy of the muscles of the right leg.  LABORATORY DATA:  Dilantin level less than 2.5, INR 2.51.  Urinalysis, 3- 6 red blood cells, but otherwise negative.  He has not had a CBC or CMET drawn.  DIAGNOSTIC STUDIES: 1. Right hip x-ray obtained tonight shows no fracture. 2. Studies obtained on July 02, 2011 include CT of the head that     showed no acute intracranial abnormalities, but did show a remote     lacunar infarct and stable atrophy with ventriculomegaly and white     matter disease.  CT of the C-spine on July 02, 2011 showed     degenerative cervical spondylosis with disk disease and facet     disease.  L spine on July 02, 2011 showed no acute findings.  ASSESSMENT AND PLAN: 1. Right leg pain - He has had significant increase in right mid thigh     pain after his recent fall, which is concerning for an occult     fracture.  There was no fracture seen on hip x-ray.  We will obtain     a computed tomography of the right thigh to rule out fracture.  We     will have physical therapy and occupational therapy evaluate.  If     computed tomography is negative, consider magnetic resonance     imaging of the lumbosacral spine to rule out radicular pain.     Anticipate that he will need nursing facility placement for     ambulation. 2. Gait instability with recurrent falls - He has had progressive     right leg weakness in the setting of peripheral neuropathy and     possible underlying statin related myopathy.  Also, wonder whether     he may have an underlying neurologic process that is progressive.     He is followed by Dr. Anne Hahn for seizure disorder.  We will obtain     physical therapy and occupational therapy evaluation to help with     balance training.  Anticipate need for skilled nursing facility     rehabilitation. 3. Failure to thrive - He is not thriving at home as he is unable to     ambulate without maximal assistance.  He will need skilled nursing      facility rehab. 4. Atrial fibrillation - We will continue his Coumadin for now,     although we may need to discontinue this due to his fall risk and     risk of bleeding.  He has not had any complications of Coumadin, so     will not discontinue it at this point.  We will continue with     protocol per pharmacy. 5. Disposition - Anticipate the need for  skilled nursing facility     rehab.  We will plan for discharge on Monday after inpatient     evaluation over the weekend and stabilization with pain control.     [name] Baars, M.D.     WS/MEDQ  D:  07/04/2011  T:  07/04/2011  Job:  [id number]  cc:   Loraine Leriche A. Perini, M.D. Fax: [phone]  C. Lesia Sago, M.D. Fax: [phone]  Electronically Signed by Lacretia Nicks. Buren Kos M.D. on 07/08/2011 10:08:25 PM

## 2011-07-09 LAB — COMPREHENSIVE METABOLIC PANEL
ALT: 12 U/L (ref 0–53)
CO2: 29 mEq/L (ref 19–32)
Calcium: 8.7 mg/dL (ref 8.4–10.5)
Chloride: 98 mEq/L (ref 96–112)
Creatinine, Ser: 0.87 mg/dL (ref 0.50–1.35)
GFR calc Af Amer: >60 mL/min (ref >60)
GFR calc non Af Amer: >60 mL/min (ref >60)
Glucose, Bld: 91 mg/dL (ref 70–99)
Sodium: 133 mEq/L — ABNORMAL LOW (ref 135–145)
Total Bilirubin: 1 mg/dL (ref 0.3–1.2)

## 2011-07-09 LAB — CBC
Hemoglobin: 15.3 g/dL (ref 13.0–17.0)
MCH: 31.7 pg (ref 26.0–34.0)
MCV: 95.2 fL (ref 78.0–100.0)
RBC: 4.82 MIL/uL (ref 4.22–5.81)
WBC: 7.4 10*3/uL (ref 4.0–10.5)

## 2011-07-09 LAB — DIFFERENTIAL
Basophils Absolute: 0 10*3/uL (ref 0.0–0.1)
Basophils Relative: 0 % (ref 0–1)
Eosinophils Absolute: 0.3 10*3/uL (ref 0.0–0.7)
Eosinophils Relative: 4 % (ref 0–5)
Lymphocytes Relative: 9 % — ABNORMAL LOW (ref 12–46)
Lymphs Abs: 0.6 10*3/uL — ABNORMAL LOW (ref 0.7–4.0)
Monocytes Absolute: 1 10*3/uL (ref 0.1–1.0)
Monocytes Relative: 13 % — ABNORMAL HIGH (ref 3–12)
Neutro Abs: 5.5 10*3/uL (ref 1.7–7.7)
Neutrophils Relative %: 74 % (ref 43–77)

## 2011-07-09 LAB — VITAMIN D 25 HYDROXY (VIT D DEFICIENCY, FRACTURES): Vit D, 25-Hydroxy: 25 ng/mL — ABNORMAL LOW (ref 30–89)

## 2011-07-09 LAB — VITAMIN B12: Vitamin B-12: 256 pg/mL (ref 211–911)

## 2011-07-10 ENCOUNTER — Other Ambulatory Visit: Payer: Self-pay | Admitting: Interventional Radiology

## 2011-07-10 ENCOUNTER — Inpatient Hospital Stay (HOSPITAL_COMMUNITY): Payer: Medicare Other

## 2011-07-10 LAB — PROTIME-INR: Prothrombin Time: 16.3 seconds — ABNORMAL HIGH (ref 11.6–15.2)

## 2011-07-10 LAB — PTH, INTACT AND CALCIUM: Calcium, Total (PTH): 8.8 mg/dL (ref 8.4–10.5)

## 2011-07-10 MED ORDER — IOHEXOL 300 MG/ML  SOLN: 50.0000 mL | Freq: Once | INTRAMUSCULAR | Status: AC | PRN

## 2011-07-11 LAB — PROTIME-INR: Prothrombin Time: 15 seconds (ref 11.6–15.2)

## 2011-07-12 LAB — PROTEIN ELECTROPH W RFLX QUANT IMMUNOGLOBULINS
Albumin ELP: 55.8 % (ref 55.8–66.1)
Alpha-1-Globulin: 8 % — ABNORMAL HIGH (ref 2.9–4.9)
Beta 2: 3.9 % (ref 3.2–6.5)
Total Protein ELP: 5.8 g/dL — ABNORMAL LOW (ref 6.0–8.3)

## 2011-07-12 LAB — IMMUNOFIXATION ADD-ON

## 2011-07-13 LAB — IGG, IGA, IGM
IgA: 120 mg/dL (ref 68–379)
IgG (Immunoglobin G), Serum: 560 mg/dL — ABNORMAL LOW (ref 650–1600)
IgM, Serum: 29 mg/dL — ABNORMAL LOW (ref 41–251)

## 2011-07-18 NOTE — Discharge Summary (Signed)
NAMECIRILO, CANNER NO.:  [id number]  MEDICAL RECORD NO.:  [id number]  LOCATION:  1512                         FACILITY:  [facility]  PHYSICIAN:  Tandy Grawe A. Adaia Matthies, M.D.   DATE OF BIRTH:  April 03, 1926  DATE OF ADMISSION:  07/04/2011 DATE OF DISCHARGE:  07/11/2011                              DISCHARGE SUMMARY   DISCHARGE DIAGNOSES: 1. Severe osteoporosis with an acute L4 compression fracture status     post kyphoplasty on July 10, 2011, with good result. 2. Previous history of lumbar compression fracture at a higher level     and previous history of wrist fracture; therefore, he has severe     osteoporosis. 3. Gait instability with frequent falls. 4. Hypogonadism. 5. Monoclonal gammopathy of undetermined significance. 6. Seizure disorder. 7. Hypertension. 8. Atrial fibrillation. 9. Constipation. 10.Atherosclerotic coronary disease. 11.Peripheral neuropathy. 12.Remote history of left basal ganglia stroke. 13.Obstructive sleep apnea. 14.Degenerative disk disease with MRI this admission showing small     annular tear in the L5-S1 disk which abuts the right L5 nerve root     and the foramen which could be symptomatic for the patient.  PROCEDURES:  Again MRI, July 07, 2011, which showed acute compression fracture of L4 with some concern raised for pathologic compression fracture and that is why a biopsy was performed during his kyphoplasty. CT scan of the femur on July 05, 2011, showing no acute abnormality of the right thigh, negative hip x-ray on July 04, 2011.  Cervical spine CT scan on July 02, 2011, which shows and CT of the head which showed stable age-related cerebral atrophy, ventriculomegaly and periventricular white matter disease, remote lacunar type infarcts but no acute finding and there were degenerative cervical spondylosis and disk disease and facet disease noted in the spine.  DISCHARGE MEDICATIONS: 1. Tylenol 650 mg every 6 hours as needed. 2.  Dulcolax 10 mg suppository one per rectum daily as needed. 3. Colace 100 mg p.o. twice daily. 4. Fleet's enema 133 mL daily as needed. 5. Hydrocodone/APAP 5/325 one tablet every 6 hours as needed. 6. Metoprolol succinate 100 mg XL 100 mg daily and 50 mg of metoprolol     succinate XL daily at bedtime each day. 7. Protonix 40 mg daily. 8. Dilantin 100 mg, the dose is 2 pills p.o. twice daily for a total     of 200 mg twice daily. 9. Polyethylene glycol 3350 powder (MiraLax) 17 g in 8 ounces of water     by mouth daily as needed. 10.Vitamin B12 1000 mcg intramuscular once a week for 4 weeks, then he     is to change to once monthly intramuscular B12 dosing after that. 11.Vitamin D3 1000 units 2 tablets by mouth daily. 12.Reclast.  Our office will arrange a Reclast infusion for his     osteoporosis in the next 2-3 weeks at the [facility]     and we are working on this. 13.Zoledronic acid 5 mg. 14.Vitamin B12 500 mcg 1 tablet p.o. daily long-term. 15.Coumadin 5 mg tablet, he is to take 2.5 mg every Tuesday and Friday     and 5 mg the remaining 5 days of every  week.  He should have a     PT/INR check on Monday July 15, 2011. 16.Diovan 80 mg p.o. daily. 17.Depo-Testosterone 300 mg intramuscular every 3 weeks but he was     just given a dose on July 11, 2011, prior to discharge.  HISTORY OF PRESENT ILLNESS:  Ms. Bounds is an 77 year old gentleman with history of atrial fibrillation, on anticoagulation; peripheral neuropathy; gait instability; and recurrent falls who presented to the emergency room with right leg pain.  He has had multiple falls and unstable gait.  He fell 2 days prior to presenting to the emergency room.  He did have a CT of the head and C-spine which were negative except for degenerative changes.  He was discharged home but he continued to have severe right thigh pain and inability to ambulate with pain with weightbearing.  He was unable to care for himself  at home and therefore returned to the emergency room and was admitted for further care.  HOSPITAL COURSE:  Ramere was admitted to a regular hospital bed.  He remained stable from a cardiovascular and pulmonary standpoint during his stay.  He underwent evaluation with radiographic studies as noted above.  He was found to have an acute L4 compression fracture which was felt to be the cause of his symptoms.  Interventional Radiology was consulted.  His Coumadin was discontinued to allow his INR to come down to an acceptable level for kyphoplasty which she received on July 10, 2011, and tolerated this well.  He did report pain relief with this procedure.  Physical Therapy and Occupational Therapy worked with the patient and it was felt that he would require short-term skilled nursing facility placement for a few weeks to try to improve his strength and mobility before going home with his family.  He is a full code status. On July 11, 2011, he was deemed stable for discharge to the skilled nursing facility.  DISCHARGE PHYSICAL EXAM:  VITAL SIGNS:  Temperature 98.1, afebrile, 94% saturation on room air, pulse 87, respiratory rate 17, blood pressure 137/56 and 150/81 on the 2 checks prior to discharge. GENERAL:  He was in no acute distress, semi supine in bed.  No JVD, no pallor, no icterus. LUNGS:  Clear to auscultation bilaterally with no wheezes, rales or rhonchi. HEART:  Regular rate and rhythm with no murmur, rub or gallop. ABDOMEN:  Soft, nontender, nondistended.  There was no edema.  DISCHARGE LABORATORY DATA:  On July 11, 2011, INR was 1.16 but his Coumadin is to be resumed later on this day.  Intact parathyroid hormone was somewhat elevated 82.9 on July 09, 2011.  Vitamin D level was somewhat low at 25 ng/mL.  B12 level was low at 256 pcg/mL.  He did receive one intramuscular B12 shot on that day.  On July 09, 2011, sodium 133, potassium 4.3, chloride 98, CO2 of 29, BUN 15,  creatinine 0.87, glucose 91, GFR greater than 60, total bili 1.0, alk phos 135, AST 20, ALT 12, total protein 6.6, albumin 3.2, calcium 8.7.  White count 7.4, hemoglobin 15.3, platelet count 202,000.  There were 74% segs, 9% lymphocytes, 13% monocytes.  Dilantin level on July 04, 2011, was less than 2.5.  DISCHARGE INSTRUCTIONS:  Verna is to use his walker and work with Physical Therapy.  He is not to do any lifting more than 5 pounds.  He is to be on a low sodium, heart healthy diet.  He should have another PT/INR check again on Monday,  July 15, 2011, at the nursing home.  He should see Dr. Waynard Edwards in 2 to 5 days after leaving the nursing facility for close followup.     Ajiah Mcglinn A. Waynard Edwards, M.D.     MAP/MEDQ  D:  07/11/2011  T:  07/11/2011  Job:  [id number]  Electronically Signed by Rodrigo Ran M.D. on 07/18/2011 12:00:53 PM

## 2011-08-07 ENCOUNTER — Encounter (HOSPITAL_COMMUNITY): Payer: Medicare Other

## 2011-09-19 LAB — POCT I-STAT, CHEM 8
BUN: 20 mg/dL (ref 6–23)
Calcium, Ion: 1.01 mmol/L — ABNORMAL LOW (ref 1.12–1.32)
Creatinine, Ser: 1.1 mg/dL (ref 0.4–1.5)
Glucose, Bld: 153 mg/dL — ABNORMAL HIGH (ref 70–99)
TCO2: 18 mmol/L (ref 0–100)

## 2011-09-19 LAB — PROTIME-INR: Prothrombin Time: 24.3 seconds — ABNORMAL HIGH (ref 11.6–15.2)

## 2011-09-19 LAB — PHENYTOIN LEVEL, TOTAL: Phenytoin Lvl: 12 ug/mL (ref 10.0–20.0)

## 2011-09-19 LAB — TYPE AND SCREEN: ABO/RH(D): B POS

## 2011-10-17 ENCOUNTER — Encounter (HOSPITAL_COMMUNITY): Payer: Medicare Other

## 2011-11-30 ENCOUNTER — Other Ambulatory Visit: Payer: Self-pay

## 2011-11-30 ENCOUNTER — Emergency Department (HOSPITAL_COMMUNITY): Payer: Medicare Other

## 2011-11-30 ENCOUNTER — Emergency Department (HOSPITAL_COMMUNITY)
Admission: EM | Admit: 2011-11-30 | Discharge: 2011-11-30 | Disposition: A | Discharge status: Home / Self Care | Payer: Medicare Other | Attending: Emergency Medicine | Admitting: Emergency Medicine

## 2011-11-30 ENCOUNTER — Encounter (HOSPITAL_COMMUNITY): Payer: Self-pay | Admitting: Emergency Medicine

## 2011-11-30 DIAGNOSIS — I4891 Unspecified atrial fibrillation: Secondary | ICD-10-CM | POA: Insufficient documentation

## 2011-11-30 DIAGNOSIS — G40909 Epilepsy, unspecified, not intractable, without status epilepticus: Secondary | ICD-10-CM | POA: Insufficient documentation

## 2011-11-30 DIAGNOSIS — T420X1A Poisoning by hydantoin derivatives, accidental (unintentional), initial encounter: Secondary | ICD-10-CM

## 2011-11-30 DIAGNOSIS — Z951 Presence of aortocoronary bypass graft: Secondary | ICD-10-CM | POA: Insufficient documentation

## 2011-11-30 DIAGNOSIS — T420X5A Adverse effect of hydantoin derivatives, initial encounter: Secondary | ICD-10-CM | POA: Insufficient documentation

## 2011-11-30 DIAGNOSIS — N400 Enlarged prostate without lower urinary tract symptoms: Secondary | ICD-10-CM | POA: Insufficient documentation

## 2011-11-30 DIAGNOSIS — Z8673 Personal history of transient ischemic attack (TIA), and cerebral infarction without residual deficits: Secondary | ICD-10-CM | POA: Insufficient documentation

## 2011-11-30 DIAGNOSIS — H269 Unspecified cataract: Secondary | ICD-10-CM | POA: Insufficient documentation

## 2011-11-30 DIAGNOSIS — T50995A Adverse effect of other drugs, medicaments and biological substances, initial encounter: Secondary | ICD-10-CM | POA: Insufficient documentation

## 2011-11-30 DIAGNOSIS — I10 Essential (primary) hypertension: Secondary | ICD-10-CM | POA: Insufficient documentation

## 2011-11-30 DIAGNOSIS — H538 Other visual disturbances: Secondary | ICD-10-CM | POA: Insufficient documentation

## 2011-11-30 DIAGNOSIS — I2510 Atherosclerotic heart disease of native coronary artery without angina pectoris: Secondary | ICD-10-CM | POA: Insufficient documentation

## 2011-11-30 HISTORY — DX: Unspecified atrial fibrillation: I48.91

## 2011-11-30 HISTORY — DX: Cerebral infarction, unspecified: I63.9

## 2011-11-30 HISTORY — DX: Deficiency of other specified B group vitamins: E53.8

## 2011-11-30 HISTORY — DX: Benign prostatic hyperplasia without lower urinary tract symptoms: N40.0

## 2011-11-30 HISTORY — DX: Unspecified fall, initial encounter: W19.XXXA

## 2011-11-30 LAB — CBC
MCH: 32.8 pg (ref 26.0–34.0)
MCV: 95.4 fL (ref 78.0–100.0)
Platelets: 203 10*3/uL (ref 150–400)
RDW: 14.4 % (ref 11.5–15.5)

## 2011-11-30 LAB — COMPREHENSIVE METABOLIC PANEL
Alkaline Phosphatase: 130 U/L — ABNORMAL HIGH (ref 39–117)
BUN: 24 mg/dL — ABNORMAL HIGH (ref 6–23)
Chloride: 99 mEq/L (ref 96–112)
Creatinine, Ser: 1.01 mg/dL (ref 0.50–1.35)
GFR calc Af Amer: 77 mL/min — ABNORMAL LOW (ref >90)
Glucose, Bld: 91 mg/dL (ref 70–99)
Potassium: 4.6 mEq/L (ref 3.5–5.1)
Total Bilirubin: 0.2 mg/dL — ABNORMAL LOW (ref 0.3–1.2)
Total Protein: 6.2 g/dL (ref 6.0–8.3)

## 2011-11-30 LAB — URINALYSIS, ROUTINE W REFLEX MICROSCOPIC
Glucose, UA: NEGATIVE mg/dL
Hgb urine dipstick: NEGATIVE
Ketones, ur: NEGATIVE mg/dL
Protein, ur: NEGATIVE mg/dL

## 2011-11-30 LAB — DIFFERENTIAL
Basophils Absolute: 0 K/uL (ref 0.0–0.1)
Basophils Relative: 0 % (ref 0–1)
Eosinophils Absolute: 0.3 K/uL (ref 0.0–0.7)
Eosinophils Relative: 4 % (ref 0–5)
Lymphocytes Relative: 13 % (ref 12–46)
Lymphs Abs: 0.9 K/uL (ref 0.7–4.0)
Monocytes Absolute: 1 K/uL (ref 0.1–1.0)
Monocytes Relative: 14 % — ABNORMAL HIGH (ref 3–12)
Neutro Abs: 4.8 K/uL (ref 1.7–7.7)
Neutrophils Relative %: 69 % (ref 43–77)

## 2011-11-30 LAB — APTT: aPTT: 46 seconds — ABNORMAL HIGH (ref 24–37)

## 2011-11-30 MED ORDER — SODIUM CHLORIDE 0.9 % IV BOLUS (SEPSIS): 500.0000 mL | INTRAVENOUS | Status: DC

## 2011-11-30 NOTE — ED Provider Notes (Signed)
History     CSN: [id number] Arrival date & time: 11/30/2011  5:23 PM   First MD Initiated Contact with Patient 11/30/11 1749      Chief Complaint  Patient presents with  . Blurred Vision    (Consider location/radiation/quality/duration/timing/severity/associated sxs/prior treatment) HPI  Patient relates some time this afternoon he started having blurred vision while sitting in his chair. He states he thinks it was in both eyes lasted about an hour but his left eye was worse. He has a cataract in his left eye anyway. He denies headache nausea vomiting chest pain shortness of breath. He denies any numbness is in his extremities however his wife relates today his legs have been very weak and he has been having difficulty walking even with his walker. Wife reports he has fallen recently when he lost his balance however she states he did not hit his head. She relates also he was admitted this summer he broke his back and they were told he had a stroke before on a CT scan. Although patient states he was not aware he had a stroke.  Primary care physician Dr. Waynard Edwards Cardiologist Dr.Tilley  Past Medical History  Diagnosis Date  . Hypertension   . Epilepsy   . CAD (coronary artery disease)   . Hx of CABG   . Stroke   . Seizures     last seizure 4-5 years  . B12 deficiency   . Low testosterone   . BPH (benign prostatic hyperplasia)   . A-fib   . Fall     Past Surgical History  Procedure Date  . Back surgery 05/2011    cement fusion of broken vertebrae  . Cardiac surgery   . Hernia repair     History reviewed. No pertinent family history.  History  Substance Use Topics  . Smoking status: Never Smoker   . Smokeless tobacco: Never Used  . Alcohol Use: No   Patient uses walker Patient lives at home with his wife   Review of Systems  All other systems reviewed and are negative.    Allergies  Review of patient's allergies indicates no known allergies.  Home  Medications   Current Outpatient Rx  Name Route Sig Dispense Refill  . VITAMIN D 1000 UNITS PO TABS Oral Take 2,000 Units by mouth as directed. Takes 1 tablet on the 1st and takes 1 tablet on the 15th of every month     . DOCUSATE SODIUM 100 MG PO CAPS Oral Take 100 mg by mouth at bedtime.      Marland Kitchen GABAPENTIN 100 MG PO CAPS Oral Take 100 mg by mouth at bedtime.      Marland Kitchen METOPROLOL SUCCINATE ER 100 MG PO TB24 Oral Take 100 mg by mouth 2 (two) times daily.      Marland Kitchen PANTOPRAZOLE SODIUM 40 MG PO TBEC Oral Take 40 mg by mouth daily.      Marland Kitchen PHENYTOIN SODIUM EXTENDED 100 MG PO CAPS Oral Take 200 mg by mouth 2 (two) times daily.      Marland Kitchen VALSARTAN 320 MG PO TABS Oral Take 320 mg by mouth 2 (two) times daily.      Marland Kitchen VITAMIN B1-B12 IJ Injection Inject 1,000 mcg as directed every 30 (thirty) days.        BP 194/78  Pulse 80  Temp(Src) 98.1 F (36.7 C) (Oral)  Resp 20  SpO2 100% Vital signs show hypertension otherwise no acute changes  Physical Exam  Nursing note and vitals reviewed.  Constitutional: He is oriented to person, place, and time.  Non-toxic appearance. He does not appear ill. No distress.       Elderly frail white man who is alert and cooperative in no distress  HENT:  Head: Normocephalic and atraumatic.  Right Ear: External ear normal.  Left Ear: External ear normal.  Nose: Nose normal. No mucosal edema or rhinorrhea.  Mouth/Throat: Oropharynx is clear and moist and mucous membranes are normal. No dental abscesses or uvula swelling.  Eyes: Conjunctivae and EOM are normal. Pupils are equal, round, and reactive to light.       Patient's noted to have a cataract in his left eye  Neck: Normal range of motion and full passive range of motion without pain. Neck supple.  Cardiovascular: Normal rate, regular rhythm and normal heart sounds.  Exam reveals no gallop and no friction rub.   No murmur heard. Pulmonary/Chest: Effort normal and breath sounds normal. No respiratory distress. He has no  wheezes. He has no rhonchi. He has no rales. He exhibits no tenderness and no crepitus.  Abdominal: Soft. Normal appearance and bowel sounds are normal. He exhibits no distension. There is no tenderness. There is no rebound and no guarding.  Musculoskeletal: Normal range of motion. He exhibits no edema and no tenderness.       Moves all extremities well. Patient's noted to have significant kyphosis  Neurological: He is alert and oriented to person, place, and time. He has normal strength. No cranial nerve deficit.       Patient has no facial nerve palsy or asymmetry. He has no pronator drift. Is able to lift his legs and hold them against gravity, he is also able to flex his knees he does not have Babinski present when I check his visual fields there is no gross visual field deficits noted  Skin: Skin is warm, dry and intact. No rash noted. No erythema. No pallor.  Psychiatric: He has a normal mood and affect. His speech is normal and behavior is normal. His mood appears not anxious.    ED Course  Procedures (including critical care time)  After examining patient MRI of the brain was ordered however they are he left for the day. CT scan was ordered instead.  21:04 Dr Jerelyn Scott see if patient can walk  22:30 patient able to ambulate with his walker and is doing better than he was at home. Pt seem more alert now than he did when I first saw him. We have discussed his Dilantin level is too high, he is to not take it tonight or tomorrow. I spoke to Dr.N  Kevan Ny and he is going to leave a message at Dr. Laurey Morale office that the patient needs a Dilantin level done on Monday morning.  Results for orders placed during the hospital encounter of 11/30/11  CBC      Component Value Range   WBC 6.9  4.0 - 10.5 (K/uL)   RBC 4.81  4.22 - 5.81 (MIL/uL)   Hemoglobin 15.8  13.0 - 17.0 (g/dL)   HCT 16.1  09.6 - 04.5 (%)   MCV 95.4  78.0 - 100.0 (fL)   MCH 32.8  26.0 - 34.0 (pg)   MCHC 34.4  30.0 - 36.0 (g/dL)     RDW 40.9  81.1 - 91.4 (%)   Platelets 203  150 - 400 (K/uL)  DIFFERENTIAL      Component Value Range   Neutrophils Relative 69  43 - 77 (%)  Neutro Abs 4.8  1.7 - 7.7 (K/uL)   Lymphocytes Relative 13  12 - 46 (%)   Lymphs Abs 0.9  0.7 - 4.0 (K/uL)   Monocytes Relative 14 (*) 3 - 12 (%)   Monocytes Absolute 1.0  0.1 - 1.0 (K/uL)   Eosinophils Relative 4  0 - 5 (%)   Eosinophils Absolute 0.3  0.0 - 0.7 (K/uL)   Basophils Relative 0  0 - 1 (%)   Basophils Absolute 0.0  0.0 - 0.1 (K/uL)  COMPREHENSIVE METABOLIC PANEL      Component Value Range   Sodium 134 (*) 135 - 145 (mEq/L)   Potassium 4.6  3.5 - 5.1 (mEq/L)   Chloride 99  96 - 112 (mEq/L)   CO2 28  19 - 32 (mEq/L)   Glucose, Bld 91  70 - 99 (mg/dL)   BUN 24 (*) 6 - 23 (mg/dL)   Creatinine, Ser 1.61  0.50 - 1.35 (mg/dL)   Calcium 8.8  8.4 - 09.6 (mg/dL)   Total Protein 6.2  6.0 - 8.3 (g/dL)   Albumin 3.5  3.5 - 5.2 (g/dL)   AST 20  0 - 37 (U/L)   ALT 12  0 - 53 (U/L)   Alkaline Phosphatase 130 (*) 39 - 117 (U/L)   Total Bilirubin 0.2 (*) 0.3 - 1.2 (mg/dL)   GFR calc non Af Amer 67 (*) >90 (mL/min)   GFR calc Af Amer 77 (*) >90 (mL/min)  APTT      Component Value Range   aPTT 46 (*) 24 - 37 (seconds)  PROTIME-INR      Component Value Range   Prothrombin Time 30.3 (*) 11.6 - 15.2 (seconds)   INR 2.84 (*) 0.00 - 1.49   PHENYTOIN LEVEL, TOTAL      Component Value Range   Phenytoin Lvl 21.6 (*) 10.0 - 20.0 (ug/mL)   Laboratory interpretation, there daily Coumadin, toxic Dilantin level, otherwise no significant changes   Date: 11/30/2011  Rate: 81  Rhythm: atrial fibrillation  QRS Axis: normal  Intervals: normal  ST/T Wave abnormalities: normal  Conduction Disutrbances:none  Narrative Interpretation:   Old EKG Reviewed: unchanged from 07/10/2011    Diagnoses that have been ruled out:  Diagnoses that are still under consideration:  Final diagnoses:  Dilantin toxicity   Plan discharge to stop his dilantin  until he gets a level redrawn on Monday morning  Devoria Albe, MD, FACEP    MDM          Ward Givens, MD 11/30/11 2235

## 2011-11-30 NOTE — ED Notes (Addendum)
Patient reports history of stroke, feels like a stroke in coming on. Generalized weakness worsening today. No neuro deficits noted

## 2011-11-30 NOTE — ED Notes (Signed)
Pt ambulated from room to end of hallway and back to room with walker.  Pt tolerated procedure well.

## 2011-11-30 NOTE — ED Notes (Signed)
ZOX:[id number]<AV> Expected date:11/30/11<BR> Expected time: 5:12 PM<BR> Means of arrival:Ambulance<BR> Comments:<BR> EMS 15 GC - flu symptoms

## 2011-11-30 NOTE — ED Notes (Signed)
MD at bedside. 

## 2011-11-30 NOTE — ED Notes (Signed)
Pt states he has been having increasing weakness over past few days with decreasing appetite.  Also has cough, denies sick contacts.  No fever, chills.  Pt states that he came in today b/c of blurry vision, which has since resolved.  Pt denies having HA at the time.  Pt denies any CP, SOB, numbness or tingling.  Pt does report increased episodes of falling over past few months as well.  Wife "cannot even count the times he has fallen," it is so frequent.  He did fall yesterday, but did not hit head, he hit L flank area.  Pt denies any pain at this time.

## 2011-12-08 ENCOUNTER — Encounter (HOSPITAL_COMMUNITY): Payer: Self-pay | Admitting: Emergency Medicine

## 2011-12-08 ENCOUNTER — Emergency Department (HOSPITAL_COMMUNITY): Payer: Medicare Other

## 2011-12-08 ENCOUNTER — Inpatient Hospital Stay (HOSPITAL_COMMUNITY)
Admission: EM | Admit: 2011-12-08 | Discharge: 2011-12-14 | DRG: 101 | Disposition: A | Discharge status: Home Health | Payer: Medicare Other | Attending: Internal Medicine | Admitting: Internal Medicine

## 2011-12-08 ENCOUNTER — Other Ambulatory Visit: Payer: Self-pay

## 2011-12-08 DIAGNOSIS — E291 Testicular hypofunction: Secondary | ICD-10-CM | POA: Diagnosis present

## 2011-12-08 DIAGNOSIS — Z7901 Long term (current) use of anticoagulants: Secondary | ICD-10-CM

## 2011-12-08 DIAGNOSIS — E860 Dehydration: Secondary | ICD-10-CM | POA: Diagnosis present

## 2011-12-08 DIAGNOSIS — M810 Age-related osteoporosis without current pathological fracture: Secondary | ICD-10-CM | POA: Diagnosis present

## 2011-12-08 DIAGNOSIS — G609 Hereditary and idiopathic neuropathy, unspecified: Secondary | ICD-10-CM | POA: Diagnosis present

## 2011-12-08 DIAGNOSIS — R569 Unspecified convulsions: Secondary | ICD-10-CM

## 2011-12-08 DIAGNOSIS — Z951 Presence of aortocoronary bypass graft: Secondary | ICD-10-CM

## 2011-12-08 DIAGNOSIS — Z87891 Personal history of nicotine dependence: Secondary | ICD-10-CM

## 2011-12-08 DIAGNOSIS — I4891 Unspecified atrial fibrillation: Secondary | ICD-10-CM | POA: Diagnosis present

## 2011-12-08 DIAGNOSIS — G4733 Obstructive sleep apnea (adult) (pediatric): Secondary | ICD-10-CM | POA: Diagnosis present

## 2011-12-08 DIAGNOSIS — I10 Essential (primary) hypertension: Secondary | ICD-10-CM

## 2011-12-08 DIAGNOSIS — E538 Deficiency of other specified B group vitamins: Secondary | ICD-10-CM | POA: Diagnosis present

## 2011-12-08 DIAGNOSIS — G40802 Other epilepsy, not intractable, without status epilepticus: Principal | ICD-10-CM | POA: Diagnosis present

## 2011-12-08 DIAGNOSIS — I2510 Atherosclerotic heart disease of native coronary artery without angina pectoris: Secondary | ICD-10-CM | POA: Diagnosis present

## 2011-12-08 DIAGNOSIS — Z79899 Other long term (current) drug therapy: Secondary | ICD-10-CM

## 2011-12-08 DIAGNOSIS — Z8673 Personal history of transient ischemic attack (TIA), and cerebral infarction without residual deficits: Secondary | ICD-10-CM

## 2011-12-08 HISTORY — DX: Unspecified atrial fibrillation: I48.91

## 2011-12-08 HISTORY — DX: Dehydration: E86.0

## 2011-12-08 HISTORY — DX: Essential (primary) hypertension: I10

## 2011-12-08 HISTORY — DX: Long term (current) use of anticoagulants: Z79.01

## 2011-12-08 HISTORY — DX: Unspecified convulsions: R56.9

## 2011-12-08 LAB — CBC
HCT: 48.3 % (ref 39.0–52.0)
Hemoglobin: 16.8 g/dL (ref 13.0–17.0)
MCV: 96.2 fL (ref 78.0–100.0)
Platelets: 153 10*3/uL (ref 150–400)
RBC: 5.02 MIL/uL (ref 4.22–5.81)
WBC: 12.4 10*3/uL — ABNORMAL HIGH (ref 4.0–10.5)

## 2011-12-08 LAB — BASIC METABOLIC PANEL
BUN: 23 mg/dL (ref 6–23)
CO2: 21 mEq/L (ref 19–32)
Chloride: 98 mEq/L (ref 96–112)
Creatinine, Ser: 0.85 mg/dL (ref 0.50–1.35)

## 2011-12-08 LAB — PROTIME-INR: Prothrombin Time: 23.5 seconds — ABNORMAL HIGH (ref 11.6–15.2)

## 2011-12-08 MED ORDER — LORAZEPAM 2 MG/ML IJ SOLN
INTRAMUSCULAR | Status: AC
  Filled 2011-12-08: qty 1

## 2011-12-08 MED ORDER — LORAZEPAM 2 MG/ML IJ SOLN: 1.0000 mg | Freq: Once | INTRAMUSCULAR | Status: DC

## 2011-12-08 MED ORDER — LORAZEPAM 2 MG/ML IJ SOLN
0.5000 mg | Freq: Once | INTRAMUSCULAR | Status: AC
  Administered 2011-12-08: 0.5 mg via INTRAVENOUS

## 2011-12-08 MED ORDER — ONDANSETRON HCL 4 MG/2ML IJ SOLN
4.0000 mg | Freq: Once | INTRAMUSCULAR | Status: AC
  Administered 2011-12-08: 4 mg via INTRAVENOUS
  Filled 2011-12-08: qty 2

## 2011-12-08 NOTE — ED Notes (Signed)
ems place opa in and NRB 100%; dropped his heart rate to 20's after seizure.

## 2011-12-08 NOTE — ED Notes (Signed)
Wife stated, "watching tv, not acting right;" when she called, pt. Wouldn't respond, slumped over and snoring respirations; she did not witness seizure activity. Upon arrival, full body seizure for ! 40 secs. Hypertensive. 230/124. 12 lead. Afib.

## 2011-12-08 NOTE — H&P (Addendum)
PCP:  Dr Rodrigo Ran, [facility].  Primary Neurologist: Lesia Sago.  Chief Complaint:  Recurrent seizures.  HPI: This is an 77 year old male, with known history of severe osteoporosis, s/p lumbar compression fractures, requiring kyphoplasty, seizure disorder, gait instability, falls, hypogonadism, B12 deficiency, MGUS, HTN, atrial fibrillation, on chronic anticoagulation, s/p remote CVA, CAD, peripheral neuropathy, OSA, DJD, present ing with recurrent seizures. Patient had 2 episodes prior to arrival in ED, and one in ED. No family is present in ED at this time, patient is post-ictal and confused, History is gleaned from ED MD.  Allergies:  No Known Allergies    Past Medical History  Diagnosis Date  . Hypertension   . Epilepsy   . CAD (coronary artery disease)   . Hx of CABG   . Stroke   . Seizures     last seizure 4-5 years  . B12 deficiency   . Low testosterone   . BPH (benign prostatic hyperplasia)   . A-fib   . Fall     Past Surgical History  Procedure Date  . Back surgery 05/2011    cement fusion of broken vertebrae  . Cardiac surgery   . Hernia repair     Prior to Admission medications   Medication Sig Start Date End Date Taking? Authorizing Provider  cholecalciferol (VITAMIN D) 1000 UNITS tablet Take 2,000 Units by mouth as directed. Takes 1 tablet on the 1st and takes 1 tablet on the 15th of every month    Yes Historical Provider, MD  docusate sodium (COLACE) 100 MG capsule Take 100 mg by mouth at bedtime.     Yes Historical Provider, MD  gabapentin (NEURONTIN) 100 MG capsule Take 100 mg by mouth at bedtime.     Yes Historical Provider, MD  metoprolol (TOPROL-XL) 100 MG 24 hr tablet Take 100 mg by mouth 2 (two) times daily.     Yes Historical Provider, MD  pantoprazole (PROTONIX) 40 MG tablet Take 40 mg by mouth daily.     Yes Historical Provider, MD  phenytoin (DILANTIN) 100 MG ER capsule Take 200 mg by mouth 2 (two) times daily.     Yes  Historical Provider, MD  valsartan (DIOVAN) 320 MG tablet Take 320 mg by mouth 2 (two) times daily.     Yes Historical Provider, MD  VITAMIN B1-B12 IJ Inject 1,000 mcg as directed every 30 (thirty) days.     Yes Historical Provider, MD  warfarin (COUMADIN) 5 MG tablet Take 5 mg by mouth at bedtime.     Yes Historical Provider, MD    Social History: Married, has one son and one step daughter. Ex smoker, quit in 1960s. 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 He reports that he does not drink alcohol or use illicit drugs.  History reviewed. No pertinent family history. Father died at age 28 years, s/p MI, and mother died at age 25, s/p MI. He has 2 brothers with CAD.  Review of Systems:  Unobtainable.  Physical Exam:  General:  Patient appears awake, confused and disoriented, but not in obvious acute distress. Not short of breath at rest.  HEENT:  No clinical pallor, no jaundice, no conjunctival injection or discharge. Visible oral mucosae appear dry, and patient has bite marks on left lateral aspect of tongue. NECK:  Supple, JVP not seen, no carotid bruits, no palpable lymphadenopathy, no palpable goiter. CHEST:  Clinically clear to auscultation, no wheezes, no crackles. HEART:  Sounds 1  and 2 heard, normal, irregular, no murmurs. ABDOMEN:  Full, soft, non-tender, no palpable organomegaly, no palpable masses, normal bowel sounds. GENITALIA:  Not examind. LOWER EXTREMITIES:  No pitting edema, palpable peripheral pulses. MUSCULOSKELETAL SYSTEM:  Generalized osteoarthritic changes. CENTRAL NERVOUS SYSTEM:  Not formally examined, due to lack of cooperation. Moving all limbs.  Labs on Admission:  Results for orders placed during the hospital encounter of 12/08/11 (from the past 48 hour(s))  CBC     Status: Abnormal   Collection Time   12/08/11  7:00 PM      Component Value Range Comment   WBC 12.4 (*) 4.0 - 10.5 (K/uL)    RBC 5.02  4.22 - 5.81 (MIL/uL)     Hemoglobin 16.8  13.0 - 17.0 (g/dL)    HCT 04.5  40.9 - 81.1 (%)    MCV 96.2  78.0 - 100.0 (fL)    MCH 33.5  26.0 - 34.0 (pg)    MCHC 34.8  30.0 - 36.0 (g/dL)    RDW 91.4  78.2 - 95.6 (%)    Platelets 153  150 - 400 (K/uL)   BASIC METABOLIC PANEL     Status: Abnormal   Collection Time   12/08/11  7:00 PM      Component Value Range Comment   Sodium 134 (*) 135 - 145 (mEq/L)    Potassium 4.5  3.5 - 5.1 (mEq/L)    Chloride 98  96 - 112 (mEq/L)    CO2 21  19 - 32 (mEq/L)    Glucose, Bld 128 (*) 70 - 99 (mg/dL)    BUN 23  6 - 23 (mg/dL)    Creatinine, Ser 2.13  0.50 - 1.35 (mg/dL)    Calcium 8.9  8.4 - 10.5 (mg/dL)    GFR calc non Af Amer 78 (*) >90 (mL/min)    GFR calc Af Amer >90  >90 (mL/min)   PROTIME-INR     Status: Abnormal   Collection Time   12/08/11  7:00 PM      Component Value Range Comment   Prothrombin Time 23.5 (*) 11.6 - 15.2 (seconds)    INR 2.05 (*) 0.00 - 1.49    PHENYTOIN LEVEL, TOTAL     Status: Normal   Collection Time   12/08/11  7:00 PM      Component Value Range Comment   Phenytoin Lvl 12.1  10.0 - 20.0 (ug/mL)     Radiological Exams on Admission: *RADIOLOGY REPORT*  Clinical Data: Seizures. Altered mental status. Hypertension.  CT HEAD WITHOUT CONTRAST  Technique: Contiguous axial images were obtained from the base of the skull through the vertex without contrast.  Comparison: 11/30/2011  Findings: There is no evidence of intracranial hemorrhage, brain edema or other signs of acute infarction. There is no evidence of intracranial mass lesion or mass effect. No abnormal extra-axial fluid collections are identified.  Moderate cerebral atrophy and mild chronic small vessel disease is again seen and stable in appearance. Ventricles are stable in size. No skull abnormality identified.  IMPRESSION:  1. No acute intracranial abnormality. 2. Stable cerebral atrophy and chronic small vessel disease.  Original Report Authenticated By: Danae Orleans, M.D.  Assessment/Plan Principal Problem:  *Seizure: This is recurrent, and as patient has not fully recovered consiousness between episodes, might actually be characterized as convulsing in status. Patent's Dilantin level is actually supra-therapeutic. Dr Thad Ranger, neurologist, has already been consulted, and we shall defer definitve management to neurology recommendations. Meanwhile, will add Keppra to  treatment, in an initial dose of 500 mg Q12 hours, and utilize prn Ativan. Fortunately, head CT is devoid of acute findings. Active Problems:  1. HTN (hypertension): Patient's BP was markedly elevated at 216/113 at presentation, but has improved to 162/86. The transient elevation was likely secondary to seizure activity. We shall observe for now.  2. Atrial fibrillation: HR is fluctuating from 89-116, and patient is otherwise stable. He might benefit from beta-blockade at this time.  3. Anticoagulant long-term use: Patient's INR is therapeutic. I for one, think it is a terrible idea to continue anticoagulation in this patient, given history of gait instability, recurrent falls, and poorly controlled seizures, placing him at high risk for injury. Will hold for now.  4. Dehydration: Elevated BUN/creatinine ratio. Will commence ivi fluids.   Further management will depend on clinical course.  Time Spent on Admission: 45 mins.  Richard Holz,CHRISTOPHER 12/08/2011, 10:51 PM  Addendum: Have ordered CXR, to rule out aspiration, brain MRI and EEG.  C. Ange Puskas. MD.

## 2011-12-08 NOTE — ED Provider Notes (Signed)
History     CSN: [id number]  Arrival date & time 12/08/11  4098   First MD Initiated Contact with Patient 12/08/11 1817      Chief Complaint  Patient presents with  . Seizures    (Consider location/radiation/quality/duration/timing/severity/associated sxs/prior treatment) History Limited By: altered mental status.   family reports the patient was watching TV today and began to have a shaking episode and slumped on the couch.  He apparently has this history of seizures and this was similar to his prior seizures.  He had 2 prior to EMS arrival.  EMS report normal finger stick blood sugar but did noted to be hypertensive.  The patient had another seizure on arrival to the emergency department that resolved on its on.  There's been no recent illness.  As reported that the patient's Dilantin level was supratherapeutic last week and there were some alterations made in his Dilantin however these changes are not clear.  Otherwise there's been reported compliance with his seizure medicines.  No prior head trauma.  No recent illness.  No reported vomiting.  The patient is confused and unable to provide significant history.  The majority of his history is obtained from his wife and daughter  Past Medical History  Diagnosis Date  . Hypertension   . Epilepsy   . CAD (coronary artery disease)   . Hx of CABG   . Stroke   . Seizures     last seizure 4-5 years  . B12 deficiency   . Low testosterone   . BPH (benign prostatic hyperplasia)   . A-fib   . Fall     Past Surgical History  Procedure Date  . Back surgery 05/2011    cement fusion of broken vertebrae  . Cardiac surgery   . Hernia repair     History reviewed. No pertinent family history.  History  Substance Use Topics  . Smoking status: Never Smoker   . Smokeless tobacco: Never Used  . Alcohol Use: No      Review of Systems  Unable to perform ROS   Allergies  Review of patient's allergies indicates no known  allergies.  Home Medications   Current Outpatient Rx  Name Route Sig Dispense Refill  . VITAMIN D 1000 UNITS PO TABS Oral Take 2,000 Units by mouth as directed. Takes 1 tablet on the 1st and takes 1 tablet on the 15th of every month     . DOCUSATE SODIUM 100 MG PO CAPS Oral Take 100 mg by mouth at bedtime.      Marland Kitchen GABAPENTIN 100 MG PO CAPS Oral Take 100 mg by mouth at bedtime.      Marland Kitchen METOPROLOL SUCCINATE ER 100 MG PO TB24 Oral Take 100 mg by mouth 2 (two) times daily.      Marland Kitchen PANTOPRAZOLE SODIUM 40 MG PO TBEC Oral Take 40 mg by mouth daily.      Marland Kitchen PHENYTOIN SODIUM EXTENDED 100 MG PO CAPS Oral Take 200 mg by mouth 2 (two) times daily.      Marland Kitchen VALSARTAN 320 MG PO TABS Oral Take 320 mg by mouth 2 (two) times daily.      Marland Kitchen VITAMIN B1-B12 IJ Injection Inject 1,000 mcg as directed every 30 (thirty) days.      . WARFARIN SODIUM 5 MG PO TABS Oral Take 5 mg by mouth at bedtime.        BP 106/48  Pulse 92  Temp(Src) 98.1 F (36.7 C) (Rectal)  Resp 19  SpO2 89%  Physical Exam  Nursing note and vitals reviewed. Constitutional: He appears well-developed and well-nourished.       Hypertensive  HENT:  Head: Normocephalic and atraumatic.       Nasal trumpet in left nare  Eyes: EOM are normal. Pupils are equal, round, and reactive to light.  Neck: Normal range of motion.  Cardiovascular: Normal rate, regular rhythm, normal heart sounds and intact distal pulses.   Pulmonary/Chest: Effort normal and breath sounds normal. No respiratory distress.  Abdominal: Soft. He exhibits no distension. There is no tenderness.  Genitourinary:       External genitalia are normal  Musculoskeletal: Normal range of motion.  Neurological:       Eyes are open and only follows very simple commands.  Unable to evaluate cranial nerves or additional neurologic exam.  Skin: Skin is warm and dry.  Psychiatric: He has a normal mood and affect. Judgment normal.    ED Course  Procedures (including critical care  time)  Labs Reviewed  CBC - Abnormal; Notable for the following:    WBC 12.4 (*)    All other components within normal limits  BASIC METABOLIC PANEL - Abnormal; Notable for the following:    Sodium 134 (*)    Glucose, Bld 128 (*)    GFR calc non Af Amer 78 (*)    All other components within normal limits  PROTIME-INR - Abnormal; Notable for the following:    Prothrombin Time 23.5 (*)    INR 2.05 (*)    All other components within normal limits  PHENYTOIN LEVEL, TOTAL   Ct Head Wo Contrast  12/08/2011  *RADIOLOGY REPORT*  Clinical Data: Seizures.  Altered mental status.  Hypertension.  CT HEAD WITHOUT CONTRAST  Technique:  Contiguous axial images were obtained from the base of the skull through the vertex without contrast.  Comparison: 11/30/2011  Findings: There is no evidence of intracranial hemorrhage, brain edema or other signs of acute infarction.  There is no evidence of intracranial mass lesion or mass effect.  No abnormal extra-axial fluid collections are identified.  Moderate cerebral atrophy and mild chronic small vessel disease is again seen and stable in appearance.  Ventricles are stable in size.  No skull abnormality identified.  IMPRESSION:  1.  No acute intracranial abnormality. 2.  Stable cerebral atrophy and chronic small vessel disease.  Original Report Authenticated By: Danae Orleans, M.D.     1. Seizure       MDM  Breakthrough seizures. Head CT negative. SPoke with [facility] who recommended hospitalist admission given primary neuro component. SPoke with [facility] who will admit. Neurology will consult        Lyanne Co, MD 12/08/11 2219

## 2011-12-08 NOTE — ED Notes (Signed)
Became more alert prior to witnessed seizure. 18 ga. Lt. Fa. cbg 90

## 2011-12-09 ENCOUNTER — Inpatient Hospital Stay (HOSPITAL_COMMUNITY): Payer: Medicare Other

## 2011-12-09 ENCOUNTER — Encounter (HOSPITAL_COMMUNITY): Payer: Self-pay

## 2011-12-09 LAB — COMPREHENSIVE METABOLIC PANEL
ALT: 11 U/L (ref 0–53)
CO2: 29 mEq/L (ref 19–32)
Calcium: 8.7 mg/dL (ref 8.4–10.5)
Chloride: 101 mEq/L (ref 96–112)
Creatinine, Ser: 0.87 mg/dL (ref 0.50–1.35)
GFR calc Af Amer: 90 mL/min — ABNORMAL LOW (ref >90)
GFR calc non Af Amer: 78 mL/min — ABNORMAL LOW (ref >90)
Glucose, Bld: 104 mg/dL — ABNORMAL HIGH (ref 70–99)
Sodium: 136 mEq/L (ref 135–145)
Total Bilirubin: 0.5 mg/dL (ref 0.3–1.2)

## 2011-12-09 LAB — CARDIAC PANEL(CRET KIN+CKTOT+MB+TROPI)
CK, MB: 5 ng/mL — ABNORMAL HIGH (ref 0.3–4.0)
Relative Index: 4.1 — ABNORMAL HIGH (ref 0.0–2.5)
Relative Index: 3.8 — ABNORMAL HIGH (ref 0.0–2.5)
Total CK: 132 U/L (ref 7–232)

## 2011-12-09 LAB — CBC
Hemoglobin: 15.7 g/dL (ref 13.0–17.0)
MCHC: 33.8 g/dL (ref 30.0–36.0)
Platelets: 154 10*3/uL (ref 150–400)

## 2011-12-09 LAB — PHENYTOIN LEVEL, TOTAL: Phenytoin Lvl: 6.9 ug/mL — ABNORMAL LOW (ref 10.0–20.0)

## 2011-12-09 LAB — MRSA PCR SCREENING: MRSA by PCR: NEGATIVE

## 2011-12-09 MED ORDER — MORPHINE SULFATE 2 MG/ML IJ SOLN: 0.5000 mg | INTRAMUSCULAR | Status: DC | PRN

## 2011-12-09 MED ORDER — METOPROLOL TARTRATE 1 MG/ML IV SOLN
2.5000 mg | Freq: Four times a day (QID) | INTRAVENOUS | Status: DC
  Administered 2011-12-09 – 2011-12-10 (×6): 2.5 mg via INTRAVENOUS
  Filled 2011-12-09 (×8): qty 5

## 2011-12-09 MED ORDER — LEVETIRACETAM 500 MG PO TABS
500.0000 mg | ORAL_TABLET | Freq: Once | ORAL | Status: AC
  Administered 2011-12-09: 500 mg via ORAL
  Filled 2011-12-09 (×3): qty 1

## 2011-12-09 MED ORDER — LEVETIRACETAM 500 MG PO TABS
500.0000 mg | ORAL_TABLET | Freq: Two times a day (BID) | ORAL | Status: DC
  Administered 2011-12-10 – 2011-12-14 (×9): 500 mg via ORAL
  Filled 2011-12-09 (×10): qty 1

## 2011-12-09 MED ORDER — ACETAMINOPHEN 650 MG RE SUPP: 650.0000 mg | Freq: Four times a day (QID) | RECTAL | Status: DC | PRN

## 2011-12-09 MED ORDER — SODIUM CHLORIDE 0.9 % IV SOLN
500.0000 mg | Freq: Two times a day (BID) | INTRAVENOUS | Status: DC
  Administered 2011-12-09 (×2): 500 mg via INTRAVENOUS
  Filled 2011-12-09 (×3): qty 5

## 2011-12-09 MED ORDER — ONDANSETRON HCL 4 MG/2ML IJ SOLN: 4.0000 mg | Freq: Four times a day (QID) | INTRAMUSCULAR | Status: DC | PRN

## 2011-12-09 MED ORDER — METOPROLOL TARTRATE 1 MG/ML IV SOLN
INTRAVENOUS | Status: AC
  Filled 2011-12-09: qty 5

## 2011-12-09 MED ORDER — PANTOPRAZOLE SODIUM 40 MG IV SOLR
40.0000 mg | INTRAVENOUS | Status: DC
  Administered 2011-12-09: 40 mg via INTRAVENOUS
  Filled 2011-12-09 (×3): qty 40

## 2011-12-09 MED ORDER — LABETALOL HCL 5 MG/ML IV SOLN
10.0000 mg | Freq: Once | INTRAVENOUS | Status: DC
  Filled 2011-12-09 (×3): qty 4

## 2011-12-09 MED ORDER — PHENYTOIN SODIUM EXTENDED 100 MG PO CAPS
300.0000 mg | ORAL_CAPSULE | Freq: Every day | ORAL | Status: DC
  Administered 2011-12-09 – 2011-12-14 (×6): 300 mg via ORAL
  Filled 2011-12-09 (×6): qty 3

## 2011-12-09 MED ORDER — SODIUM CHLORIDE 0.9 % IV SOLN
INTRAVENOUS | Status: DC
  Administered 2011-12-09 – 2011-12-14 (×7): via INTRAVENOUS

## 2011-12-09 MED ORDER — ACETAMINOPHEN 325 MG PO TABS: 650.0000 mg | ORAL_TABLET | Freq: Four times a day (QID) | ORAL | Status: DC | PRN

## 2011-12-09 MED ORDER — LORAZEPAM 2 MG/ML IJ SOLN: 0.5000 mg | INTRAMUSCULAR | Status: DC | PRN

## 2011-12-09 MED ORDER — ONDANSETRON HCL 4 MG PO TABS: 4.0000 mg | ORAL_TABLET | Freq: Four times a day (QID) | ORAL | Status: DC | PRN

## 2011-12-09 NOTE — Progress Notes (Signed)
Subjective: No current complaints. Very pleasant. No further seizure events.  Objective: Vital signs in last 24 hours: Temp:  [97.8 F (36.6 C)-98.4 F (36.9 C)] 98.4 F (36.9 C) (12/24 1200) Pulse Rate:  [64-116] 65  (12/24 1200) Resp:  [13-26] 19  (12/24 1200) BP: (106-216)/(48-113) 157/82 mmHg (12/24 1200) SpO2:  [89 %-100 %] 98 % (12/24 1200) Weight:  [69.2 kg (152 lb 8.9 oz)] 152 lb 8.9 oz (69.2 kg) (12/24 0115) Weight change:     Intake/Output from previous day: 12/23 0701 - 12/24 0700 In: 438.8 [I.V.:333.8; IV Piggyback:105] Out: 450 [Urine:450] Total I/O In: 525 [I.V.:525] Out: 220 [Urine:220]   Physical Exam: General: Alert, awake, oriented x3, in no acute distress. HEENT: No bruits, no goiter. Heart: Regular rate and rhythm, without murmurs, rubs, gallops. Lungs: Clear to auscultation bilaterally. Abdomen: Soft, nontender, nondistended, positive bowel sounds. Extremities: No clubbing cyanosis or edema with positive pedal pulses. Neuro: Grossly intact, nonfocal.    Lab Results: Basic Metabolic Panel:  Basename 12/09/11 0920 12/08/11 1900  NA 136 134*  K 3.9 4.5  CL 101 98  CO2 29 21  GLUCOSE 104* 128*  BUN 18 23  CREATININE 0.87 0.85  CALCIUM 8.7 8.9  MG 2.3 --  PHOS -- --   Liver Function Tests:  [facility] 12/09/11 0920  AST 21  ALT 11  ALKPHOS 132*  BILITOT 0.5  PROT 5.8*  ALBUMIN 3.0*   CBC:  Basename 12/09/11 0920 12/08/11 1900  WBC 6.7 12.4*  NEUTROABS -- --  HGB 15.7 16.8  HCT 46.4 48.3  MCV 97.3 96.2  PLT 154 153   Cardiac Enzymes:  Basename 12/09/11 0920  CKTOTAL 121  CKMB 5.0*  CKMBINDEX --  TROPONINI <0.30   Thyroid Function Tests:  Basename 12/09/11 0920  TSH 2.181  T4TOTAL --  FREET4 --  T3FREE --  THYROIDAB --   Coagulation:  Basename 12/08/11 1900  LABPROT 23.5*  INR 2.05*    Recent Results (from the past 240 hour(s))  MRSA PCR SCREENING     Status: Normal   Collection Time   12/09/11  1:30 AM     Component Value Range Status Comment   MRSA by PCR NEGATIVE  NEGATIVE  Final     Studies/Results: Ct Head Wo Contrast  12/08/2011  *RADIOLOGY REPORT*  Clinical Data: Seizures.  Altered mental status.  Hypertension.  CT HEAD WITHOUT CONTRAST  Technique:  Contiguous axial images were obtained from the base of the skull through the vertex without contrast.  Comparison: 11/30/2011  Findings: There is no evidence of intracranial hemorrhage, brain edema or other signs of acute infarction.  There is no evidence of intracranial mass lesion or mass effect.  No abnormal extra-axial fluid collections are identified.  Moderate cerebral atrophy and mild chronic small vessel disease is again seen and stable in appearance.  Ventricles are stable in size.  No skull abnormality identified.  IMPRESSION:  1.  No acute intracranial abnormality. 2.  Stable cerebral atrophy and chronic small vessel disease.  Original Report Authenticated By: Danae Orleans, M.D.   Dg Chest Port 1 View  12/09/2011  *RADIOLOGY REPORT*  Clinical Data: Cough, congestion.  Weakness.  PORTABLE CHEST - 1 VIEW  Comparison: 02/10/2007  Findings: Prior CABG.  Heart is borderline in size.  Bibasilar atelectasis.  No effusions.  No acute bony abnormality.  IMPRESSION: Bibasilar atelectasis.  Prior CABG.  Original Report Authenticated By: Cyndie Chime, M.D.    Medications: Scheduled Meds:   .  labetalol  10 mg Intravenous Once  . levETIRAcetam  500 mg Oral BID  . levETIRAcetam  500 mg Oral Once  . LORazepam  0.5 mg Intravenous Once  . metoprolol      . metoprolol  2.5 mg Intravenous Q6H  . ondansetron (ZOFRAN) IV  4 mg Intravenous Once  . pantoprazole (PROTONIX) IV  40 mg Intravenous Q24H  . phenytoin  300 mg Oral Daily  . DISCONTD: levetiracetam  500 mg Intravenous Q12H  . DISCONTD: LORazepam  1 mg Intravenous Once   Continuous Infusions:   . sodium chloride 75 mL/hr at 12/09/11 1214   PRN Meds:.acetaminophen, acetaminophen,  LORazepam, morphine, ondansetron (ZOFRAN) IV, ondansetron  Assessment/Plan:  Principal Problem:  *Seizure Active Problems:  HTN (hypertension)  Atrial fibrillation  Anticoagulant long-term use  Dehydration   #1 seizure disorder with breakthrough seizures: It appears patient's Dilantin dose had been decreased because of a supratherapeutic Dilantin level on Monday (one week ago). He subsequently came to the hospital yesterday with breakthrough seizures. Has been seen in consultation with neurology who has recommended adding Keppra to patient's medication regimen. Patient now is on Dilantin 300 mg daily and Keppra 500 mg twice daily. An MRI and EEG have both been done with results that are currently pending.  #2 atrial fibrillation: Patient has been maintained on chronic anticoagulation with Coumadin. Coumadin has been discontinued this admission secondary to poorly controlled seizures, gait instability, recurrent falls which places him at high risk for injury and bleed.  #3 hypertension: Blood pressure is currently improved. Transient elevation was likely secondary to seizure activity.  #4 disposition: Pending PT evaluation, patient can likely be discharged home tomorrow as well as he has no further seizure activity an MRI is reassuring. Patient has been transferred out of the neuro ICU onto the floor today.     LOS: 1 day   [facility] Pager: [phone] 12/09/2011, 3:50 PM

## 2011-12-09 NOTE — Progress Notes (Signed)
PT Cancellation Note  Evaluation cancelled today as patient remains on strict bed rest and activity order has not been updated today.  12/09/2011 Edwyna Perfect, PT  Pager [phone]

## 2011-12-09 NOTE — Progress Notes (Signed)
*  PRELIMINARY RESULTS* EEG has been performed.  Joseph Bentley 12/09/2011, 12:58 PM

## 2011-12-09 NOTE — Progress Notes (Signed)
Subjective: Patient without further seizures since admission.  His only complaint today is that of a painful tongue.    Objective: Vital signs in last 24 hours: Temp:  [97.8 F (36.6 C)-98.1 F (36.7 C)] 98 F (36.7 C) (12/24 0800) Pulse Rate:  [64-116] 74  (12/24 1000) Resp:  [13-26] 18  (12/24 1000) BP: (106-216)/(48-113) 139/66 mmHg (12/24 1000) SpO2:  [89 %-100 %] 96 % (12/24 1000) Weight:  [69.2 kg (152 lb 8.9 oz)] 152 lb 8.9 oz (69.2 kg) (12/24 0115)  Intake/Output from previous day: 12/23 0701 - 12/24 0700 In: 438.8 [I.V.:333.8; IV Piggyback:105] Out: 450 [Urine:450] Intake/Output this shift: Total I/O In: 300 [I.V.:300] Out: -  Nutritional status: NPO  Neurologic Exam: Mental Status: Alert, oriented, thought content appropriate.  Speech fluent without evidence of aphasia.  Able to follow 3 step commands without difficulty. Cranial Nerves: II: visual fields grossly normal, pupils equal, round, reactive to light and accommodation III,IV, VI: ptosis not present, extra-ocular motions intact bilaterally V,VII: smile symmetric, facial light touch sensation normal bilaterally VIII: hearing normal bilaterally IX,X: gag reflex present XI: trapezius strength/neck flexion strength normal bilaterally XII: tongue strength normal  Motor: Right : Upper extremity   5/5    Left:     Upper extremity   5/5  Lower extremity   5/5     Lower extremity   5/5 Tone and bulk:normal tone throughout; no atrophy noted Sensory: Pinprick and light touch intact throughout, bilaterally Deep Tendon Reflexes: 2+ and symmetric throughout Plantars: Right: downgoing   Left: downgoing Cerebellar: normal finger-to-nose and normal heel-to-shin test  Lab Results:  Basename 12/09/11 0920 12/08/11 1900  WBC 6.7 12.4*  HGB 15.7 16.8  HCT 46.4 48.3  PLT 154 153  NA 136 134*  K 3.9 4.5  CL 101 98  CO2 29 21  GLUCOSE 104* 128*  BUN 18 23  CREATININE 0.87 0.85  CALCIUM 8.7 8.9  LABA1C -- --     Studies/Results: Ct Head Wo Contrast  12/08/2011  *RADIOLOGY REPORT*  Clinical Data: Seizures.  Altered mental status.  Hypertension.  CT HEAD WITHOUT CONTRAST  Technique:  Contiguous axial images were obtained from the base of the skull through the vertex without contrast.  Comparison: 11/30/2011  Findings: There is no evidence of intracranial hemorrhage, brain edema or other signs of acute infarction.  There is no evidence of intracranial mass lesion or mass effect.  No abnormal extra-axial fluid collections are identified.  Moderate cerebral atrophy and mild chronic small vessel disease is again seen and stable in appearance.  Ventricles are stable in size.  No skull abnormality identified.  IMPRESSION:  1.  No acute intracranial abnormality. 2.  Stable cerebral atrophy and chronic small vessel disease.  Original Report Authenticated By: Danae Orleans, M.D.   Dg Chest Port 1 View  12/09/2011  *RADIOLOGY REPORT*  Clinical Data: Cough, congestion.  Weakness.  PORTABLE CHEST - 1 VIEW  Comparison: 02/10/2007  Findings: Prior CABG.  Heart is borderline in size.  Bibasilar atelectasis.  No effusions.  No acute bony abnormality.  IMPRESSION: Bibasilar atelectasis.  Prior CABG.  Original Report Authenticated By: Cyndie Chime, M.D.    Medications:  I have reviewed the patient's current medications. Scheduled:   . labetalol  10 mg Intravenous Once  . levetiracetam  500 mg Intravenous Q12H  . LORazepam  0.5 mg Intravenous Once  . metoprolol      . metoprolol  2.5 mg Intravenous Q6H  . ondansetron (ZOFRAN)  IV  4 mg Intravenous Once  . pantoprazole (PROTONIX) IV  40 mg Intravenous Q24H  . DISCONTD: LORazepam  1 mg Intravenous Once    Assessment/Plan:  Patient Active Hospital Problem List: Seizure (12/08/2011)   Assessment: Seizure frequency has increased despite a therapeutic Dilantin level.  May require an additional agent with plans that can be made at a later date to go to monotherapy if  the additional agent is effective.   Plan:  1. Dilantin restarted at 300mg  a day  2.  Continue Keppra at 500mg  bid but change to po  3.  MR pending  4.  EEG pending   LOS: 1 day   Thana Farr, MD [facility] [phone] 12/09/2011  12:08 PM

## 2011-12-09 NOTE — Consult Note (Signed)
Reason for Consult:Seizures  Referring Physician: Dr Kerry Hough is an 77 y.o. male. History according to EMR and conversation with Dr Patria Mane.  Pt a poor historian  HPI: Pt has known history of seizure disorder on Dilantin (followed by Dr Anne Hahn) and gait instability, atrial fibrillation with prior stroke, along with multiple other medical issues (see below) seen for recurrent seizures. Patient had 2 seizures (? GTC seizures) prior to arrival in ED, and one in ED.  Reportedly, family reported the patient was watching TV today and began to have a shaking episode and slumped on the couch. Dilantin level was supratherapeutic last week (21.6 on 12/15).  Dilantin dosage was adjusted at that time (details ?).  In ER he underwent Head Ct (negative) and Neurology now asked to see patient to provide further recommendations in regards to anticonvulsant regimen.  In regards to his Epilepsy, details are unclear in regards to duration, frequency, past meds tried, past workup, etc...   Past Medical History  Diagnosis Date  . Hypertension   . Epilepsy   . CAD (coronary artery disease)   . Hx of CABG   . Stroke   . Seizures     last seizure 4-5 years  . B12 deficiency   . Low testosterone   . BPH (benign prostatic hyperplasia)   . A-fib   . Fall     Past Surgical History  Procedure Date  . Back surgery 05/2011    cement fusion of broken vertebrae  . Cardiac surgery   . Hernia repair     History reviewed. No pertinent family history.  Social History:  reports that he has never smoked. He has never used smokeless tobacco. He reports that he does not drink alcohol or use illicit drugs.  Allergies: No Known Allergies  Medications:  Scheduled:   . LORazepam  0.5 mg Intravenous Once  . ondansetron (ZOFRAN) IV  4 mg Intravenous Once  . DISCONTD: LORazepam  1 mg Intravenous Once    Results for orders placed during the hospital encounter of 12/08/11 (from the past 48 hour(s))    CBC     Status: Abnormal   Collection Time   12/08/11  7:00 PM      Component Value Range Comment   WBC 12.4 (*) 4.0 - 10.5 (K/uL)    RBC 5.02  4.22 - 5.81 (MIL/uL)    Hemoglobin 16.8  13.0 - 17.0 (g/dL)    HCT 40.9  81.1 - 91.4 (%)    MCV 96.2  78.0 - 100.0 (fL)    MCH 33.5  26.0 - 34.0 (pg)    MCHC 34.8  30.0 - 36.0 (g/dL)    RDW 78.2  95.6 - 21.3 (%)    Platelets 153  150 - 400 (K/uL)   BASIC METABOLIC PANEL     Status: Abnormal   Collection Time   12/08/11  7:00 PM      Component Value Range Comment   Sodium 134 (*) 135 - 145 (mEq/L)    Potassium 4.5  3.5 - 5.1 (mEq/L)    Chloride 98  96 - 112 (mEq/L)    CO2 21  19 - 32 (mEq/L)    Glucose, Bld 128 (*) 70 - 99 (mg/dL)    BUN 23  6 - 23 (mg/dL)    Creatinine, Ser 0.86  0.50 - 1.35 (mg/dL)    Calcium 8.9  8.4 - 10.5 (mg/dL)    GFR calc non Af Amer 78 (*) >  90 (mL/min)    GFR calc Af Amer >90  >90 (mL/min)   PROTIME-INR     Status: Abnormal   Collection Time   12/08/11  7:00 PM      Component Value Range Comment   Prothrombin Time 23.5 (*) 11.6 - 15.2 (seconds)    INR 2.05 (*) 0.00 - 1.49    PHENYTOIN LEVEL, TOTAL     Status: Normal   Collection Time   12/08/11  7:00 PM      Component Value Range Comment   Phenytoin Lvl 12.1  10.0 - 20.0 (ug/mL)     Ct Head Wo Contrast  12/08/2011  *RADIOLOGY REPORT*  Clinical Data: Seizures.  Altered mental status.  Hypertension.  CT HEAD WITHOUT CONTRAST  Technique:  Contiguous axial images were obtained from the base of the skull through the vertex without contrast.  Comparison: 11/30/2011  Findings: There is no evidence of intracranial hemorrhage, brain edema or other signs of acute infarction.  There is no evidence of intracranial mass lesion or mass effect.  No abnormal extra-axial fluid collections are identified.  Moderate cerebral atrophy and mild chronic small vessel disease is again seen and stable in appearance.  Ventricles are stable in size.  No skull abnormality identified.   IMPRESSION:  1.  No acute intracranial abnormality. 2.  Stable cerebral atrophy and chronic small vessel disease.  Original Report Authenticated By: Danae Orleans, M.D.    Review of Systems: A comprehensive review of systems was negative except for: Neurological: positive for seizures  Blood pressure 171/88, pulse 101, temperature 97.8 F (36.6 C), temperature source Oral, resp. rate 20, SpO2 99.00%.  Neurological Exam:  Patient is alert and oriented x 2 (? Year, + knows month).  No acute distress.  Fluent but mildly dysarthric speech.  Able to name, repeat, and follow commands.  EOMI, VFFC, PERRL.  Face symmetric with normal sensation.  Tongue protrudes midline, otherwise CN's II-XII intact. Motor: Normal bulk and tone with 5/5 strength throughout.  Sensation:  Normal pinprick, vibration, and proprioception.  Cerebellar:  Normal finger to nose  No pronator drift.  DTR's 2+ throughout with toes downgoing  Cardiac:  RRR without murmur, rub, gallop Carotids: no bruits Lungs: CTAB Abdomen: soft, NT/ND with NABS Skin: no obvious cuts, abrasion, bruises.  Notably dry skin in lower extremities   Assessment/Plan: 1) Epilepsy with breakthrough seizures  Plan:  Pt has know history of epilepsy, but has had three further seizures tonight on Dilantin monotherapy, despite therapeutic level of 12.1.  Etiology of recent seizures is unclear.  Other than some mild confusion, his neurological exam appears non-focal.  1) agree with adding Keppra to regimen (500 mg bid) 2) Head MRI/MRA to check for ischemic lesion that might explain recent seizures 3) EEG 4) No driving upon discharge til follow-up with his neurologist (Dr Anne Hahn) 5) agree that he would be high risk for Coumadin given falls and seizures  Jadarious Dobbins 12/09/2011, 12:01 AM

## 2011-12-09 NOTE — Procedures (Signed)
EEG NUMBER:  REFERRING PHYSICIAN:  Isidor Holts, M.D.  HISTORY:  An 77 year old male with recurrent seizures.  MEDICATIONS:  Dilantin, Keppra, Normodyne, Lopressor, Protonix.  CONDITIONS OF RECORDING:  This is a 16-channel EEG carried out with the patient in the awake, drowsy, and asleep states.  DESCRIPTION:  The waking background activity consists of a low-voltage, symmetrical, fairly well-organized 7 Hz theta activity seen from the parieto-occipital and posterotemporal regions.  Low-voltage, fast activity, poorly organized was seen anteriorly at times, superimposed on more posterior rhythms.  A mixture of theta and alpha was seen from the central and temporal regions.  The patient drowses with slowing to irregular which is theta and beta activity.  The patient goes into a light sleep with symmetrical sleep spindles, vertex with sharp activity and irregular slow activity.  Hypoventilation was not performed. Intermittent photic stimulation failed to elicit any change in the tracing.  IMPRESSION:  This is an abnormal EEG secondary to posterior background slowing.  This finding can be seen with a diffuse gray matter disturbance that is etiologically nonspecific, but may include dementia among other possibilities.          ______________________________ Thana Farr, MD    [id number] D:  12/09/2011 17:31:40  T:  12/09/2011 04:54:09  Job #:  [id number]

## 2011-12-10 LAB — PHENYTOIN LEVEL, TOTAL: Phenytoin Lvl: 7 ug/mL — ABNORMAL LOW (ref 10.0–20.0)

## 2011-12-10 LAB — CARDIAC PANEL(CRET KIN+CKTOT+MB+TROPI): Total CK: 152 U/L (ref 7–232)

## 2011-12-10 MED ORDER — METOPROLOL SUCCINATE ER 100 MG PO TB24
100.0000 mg | ORAL_TABLET | Freq: Two times a day (BID) | ORAL | Status: DC
  Administered 2011-12-10 – 2011-12-11 (×3): 100 mg via ORAL
  Filled 2011-12-10 (×4): qty 1

## 2011-12-10 MED ORDER — GABAPENTIN 100 MG PO CAPS
100.0000 mg | ORAL_CAPSULE | Freq: Every day | ORAL | Status: DC
  Administered 2011-12-10 – 2011-12-13 (×4): 100 mg via ORAL
  Filled 2011-12-10 (×5): qty 1

## 2011-12-10 MED ORDER — DOCUSATE SODIUM 100 MG PO CAPS
100.0000 mg | ORAL_CAPSULE | Freq: Every day | ORAL | Status: DC
  Administered 2011-12-10 – 2011-12-13 (×4): 100 mg via ORAL
  Filled 2011-12-10 (×4): qty 1

## 2011-12-10 MED ORDER — PANTOPRAZOLE SODIUM 40 MG PO TBEC
40.0000 mg | DELAYED_RELEASE_TABLET | Freq: Every day | ORAL | Status: DC
  Administered 2011-12-10: 40 mg via ORAL

## 2011-12-10 MED ORDER — HYDRALAZINE HCL 50 MG PO TABS
50.0000 mg | ORAL_TABLET | Freq: Four times a day (QID) | ORAL | Status: DC
  Administered 2011-12-10 – 2011-12-12 (×8): 50 mg via ORAL
  Filled 2011-12-10 (×11): qty 1

## 2011-12-10 MED ORDER — VITAMIN D3 25 MCG (1000 UNIT) PO TABS: 2000.0000 [IU] | ORAL_TABLET | ORAL | Status: DC

## 2011-12-10 MED ORDER — PANTOPRAZOLE SODIUM 40 MG PO TBEC
40.0000 mg | DELAYED_RELEASE_TABLET | Freq: Every day | ORAL | Status: DC
  Administered 2011-12-10 – 2011-12-14 (×5): 40 mg via ORAL
  Filled 2011-12-10 (×3): qty 1

## 2011-12-10 MED ORDER — OLMESARTAN MEDOXOMIL 20 MG PO TABS
20.0000 mg | ORAL_TABLET | Freq: Every day | ORAL | Status: DC
  Administered 2011-12-10 – 2011-12-14 (×5): 20 mg via ORAL
  Filled 2011-12-10 (×5): qty 1

## 2011-12-10 MED ORDER — HYDRALAZINE HCL 25 MG PO TABS
25.0000 mg | ORAL_TABLET | Freq: Four times a day (QID) | ORAL | Status: DC | PRN
  Administered 2011-12-10: 25 mg via ORAL
  Filled 2011-12-10: qty 1

## 2011-12-10 NOTE — Progress Notes (Signed)
Subjective: Patient seen and examined this am. No overnight issues  Objective:  Vital signs in last 24 hours:  Filed Vitals:   12/09/11 2200 12/10/11 0522 12/10/11 0620 12/10/11 1005  BP: 170/70 170/80 175/81 209/100  Pulse: 105  86 107  Temp: 99.1 F (37.3 C)  98 F (36.7 C) 98 F (36.7 C)  TempSrc:   Oral Oral  Resp: 18  20 18   Height:      Weight:      SpO2: 93%  96% 96%    Intake/Output from previous day:   Intake/Output Summary (Last 24 hours) at 12/10/11 1056 Last data filed at 12/10/11 0700  Gross per 24 hour  Intake   1500 ml  Output   1455 ml  Net     45 ml    Physical Exam:  General: elderly male  in no acute distress.  HEENT: no pallor, moist oral mucosa Heart: Regular rate and rhythm, without murmurs, rubs, gallops.  Lungs: Clear to auscultation bilaterally.  Abdomen: Soft, nontender, nondistended, positive bowel sounds.  Extremities: No clubbing cyanosis or edema with positive pedal pulses.  Neuro: AAOX3 Grossly intact, nonfocal.    Lab Results:  Basic Metabolic Panel:    Component Value Date/Time   NA 136 12/09/2011 0920   K 3.9 12/09/2011 0920   CL 101 12/09/2011 0920   CO2 29 12/09/2011 0920   BUN 18 12/09/2011 0920   CREATININE 0.87 12/09/2011 0920   CREATININE 0.83 05/01/2009 0934   GLUCOSE 104* 12/09/2011 0920   CALCIUM 8.7 12/09/2011 0920   CALCIUM 8.8 07/09/2011 0545   CBC:    Component Value Date/Time   WBC 6.7 12/09/2011 0920   WBC 6.0 10/22/2010 1125   HGB 15.7 12/09/2011 0920   HGB 17.3* 10/22/2010 1125   HCT 46.4 12/09/2011 0920   HCT 51.1* 10/22/2010 1125   PLT 154 12/09/2011 0920   PLT 173 10/22/2010 1125   MCV 97.3 12/09/2011 0920   MCV 96.0 10/22/2010 1125   NEUTROABS 4.8 11/30/2011 1847   NEUTROABS 4.5 10/22/2010 1125   LYMPHSABS 0.9 11/30/2011 1847   LYMPHSABS 0.7* 10/22/2010 1125   MONOABS 1.0 11/30/2011 1847   MONOABS 0.6 10/22/2010 1125   EOSABS 0.3 11/30/2011 1847   EOSABS 0.1 10/22/2010 1125   BASOSABS 0.0  11/30/2011 1847   BASOSABS 0.0 10/22/2010 1125    Recent Results (from the past 240 hour(s))  MRSA PCR SCREENING     Status: Normal   Collection Time   12/09/11  1:30 AM      Component Value Range Status Comment   MRSA by PCR NEGATIVE  NEGATIVE  Final     Studies/Results: Ct Head Wo Contrast  12/08/2011  *RADIOLOGY REPORT*  Clinical Data: Seizures.  Altered mental status.  Hypertension.  CT HEAD WITHOUT CONTRAST  Technique:  Contiguous axial images were obtained from the base of the skull through the vertex without contrast.  Comparison: 11/30/2011  Findings: There is no evidence of intracranial hemorrhage, brain edema or other signs of acute infarction.  There is no evidence of intracranial mass lesion or mass effect.  No abnormal extra-axial fluid collections are identified.  Moderate cerebral atrophy and mild chronic small vessel disease is again seen and stable in appearance.  Ventricles are stable in size.  No skull abnormality identified.  IMPRESSION:  1.  No acute intracranial abnormality. 2.  Stable cerebral atrophy and chronic small vessel disease.  Original Report Authenticated By: Danae Orleans, M.D.   Mr  Brain Wo Contrast  12/09/2011  *RADIOLOGY REPORT*  Clinical Data: Seizures  MRI HEAD WITHOUT CONTRAST  Technique:  Multiplanar, multiecho pulse sequences of the brain and surrounding structures were obtained according to standard protocol without intravenous contrast.  Comparison: CT 12/08/2011  Findings:  Image quality degraded by patient motion.  Negative for acute infarct.  Generalized atrophy of a moderate to advanced degree.  Chronic ischemic changes in the cerebral white matter bilaterally and in the pons.  Negative for acute infarct.  Negative for hemorrhage or mass lesion.  Temporal lobes are normal bilaterally.  Chronic micro hemorrhage left thalamus and right frontal white matter.  These are likely related to chronic hypertension.  IMPRESSION: Atrophy and chronic ischemic  changes.  No acute infarct or mass lesion.  Original Report Authenticated By: Camelia Phenes, M.D.   Dg Chest Port 1 View  12/09/2011  *RADIOLOGY REPORT*  Clinical Data: Cough, congestion.  Weakness.  PORTABLE CHEST - 1 VIEW  Comparison: 02/10/2007  Findings: Prior CABG.  Heart is borderline in size.  Bibasilar atelectasis.  No effusions.  No acute bony abnormality.  IMPRESSION: Bibasilar atelectasis.  Prior CABG.  Original Report Authenticated By: Cyndie Chime, M.D.    Medications: Scheduled Meds:   . cholecalciferol  2,000 Units Oral UD  . docusate sodium  100 mg Oral QHS  . gabapentin  100 mg Oral QHS  . labetalol  10 mg Intravenous Once  . levETIRAcetam  500 mg Oral BID  . levETIRAcetam  500 mg Oral Once  . metoprolol  2.5 mg Intravenous Q6H  . metoprolol  100 mg Oral BID  . olmesartan  20 mg Oral Daily  . pantoprazole  40 mg Oral Q1200  . pantoprazole  40 mg Oral Daily  . phenytoin  300 mg Oral Daily  . DISCONTD: levetiracetam  500 mg Intravenous Q12H  . DISCONTD: pantoprazole (PROTONIX) IV  40 mg Intravenous Q24H   Continuous Infusions:   . sodium chloride 75 mL/hr at 12/10/11 0032   PRN Meds:.acetaminophen, acetaminophen, LORazepam, morphine, ondansetron (ZOFRAN) IV, ondansetron  Assessment: 78 male with hx of seizure dx, afib on chr coumadin, hypertension, gait instability, , severe osteoporosis, OSA , CAD who presented with breakthrough seizures in the setting of sub therapeutic dilantin level which were adjusted recently.   PLAN:  1 seizure disorder with breakthrough seizures:  It appears patient's Dilantin dose had been decreased because of a supratherapeutic Dilantin level one week back. He subsequently came to the hospital on 12/23 with breakthrough seizures. Seen by neurology and  recommended adding Keppra to patient's medication regimen. Patient now is on Dilantin 300 mg daily and Keppra 500 mg twice daily. An MRI and EEG have both been done. MRI shows chr  ischemic changes and as per neurology EEG shows no epileptiform activity. His dilantin level today is 11.3 ( corrected ro albumin). As per nurse he is still unsteady on walking to the bathroom. Has hx of frequent falls. PT eval pending Neurology recommends no driving for 6 months post discharge and follow up with them as outpt.  2 atrial fibrillation Patient has been maintained on chronic anticoagulation with Coumadin. Coumadin has been discontinued this admission secondary to poorly controlled seizures, gait instability, recurrent falls which places him at high risk for injury and bleed.   3 hypertension:  Transient elevation was likely secondary to seizure activity.  Again noted for elevated BP this am  . He is on toprol xl and diovan at home which i  will resume. hydralazine prn   Dispo:  pending PT eval and  adequate BP control likely in next 24 -48 hrs    l LOS: 2 days    Moriah Shawley 12/10/2011, 10:56 AM

## 2011-12-10 NOTE — Consult Note (Addendum)
Subjective: No seizures. Wants to go home.   Objective: Vital signs in last 24 hours: Temp:  [98 F (36.7 C)-99.1 F (37.3 C)] 98 F (36.7 C) (12/25 1005) Pulse Rate:  [65-107] 107  (12/25 1005) Resp:  [16-20] 18  (12/25 1005) BP: (147-209)/(70-100) 209/100 mmHg (12/25 1005) SpO2:  [93 %-98 %] 96 % (12/25 1005)  Intake/Output from previous day: 12/24 0701 - 12/25 0700 In: 1725 [P.O.:150; I.V.:1575] Out: 1455 [Urine:1455] Intake/Output this shift:   Nutritional status: Cardiac  Neurological exam: AAO*2. No aphasia. Followed simple commands. Recall: 0 of 3. Cranial nerves: EOMI, PERRL. Visual fields were full. Sensation to V1 through V3 areas of the face was intact and symmetric throughout. There was no facial asymmetry. Shoulder shrug was 5/5 and symmetric bilaterally. Head rotation was 5/5 bilaterally. There was no dysarthria or palatal deviation. Motor: strength was 5/5 and symmetric throughout. Sensory: was intact throughout to light touch, pinprick. Coordination: finger-to-nose were intact and symmetric bilaterally. Reflexes: were 1+ in upper extremities and 1+ at the knees and trace at the ankles. Plantar response was downgoing bilaterally. Gait: deferred  Lab Results:  Basename 12/09/11 0920 12/08/11 1900  WBC 6.7 12.4*  HGB 15.7 16.8  HCT 46.4 48.3  PLT 154 153  NA 136 134*  K 3.9 4.5  CL 101 98  CO2 29 21  GLUCOSE 104* 128*  BUN 18 23  CREATININE 0.87 0.85  CALCIUM 8.7 8.9  LABA1C -- --   Studies/Results: Ct Head Wo Contrast  12/08/2011  *RADIOLOGY REPORT*  Clinical Data: Seizures.  Altered mental status.  Hypertension.  CT HEAD WITHOUT CONTRAST  Technique:  Contiguous axial images were obtained from the base of the skull through the vertex without contrast.  Comparison: 11/30/2011  Findings: There is no evidence of intracranial hemorrhage, brain edema or other signs of acute infarction.  There is no evidence of intracranial mass lesion or mass effect.  No abnormal  extra-axial fluid collections are identified.  Moderate cerebral atrophy and mild chronic small vessel disease is again seen and stable in appearance.  Ventricles are stable in size.  No skull abnormality identified.  IMPRESSION:  1.  No acute intracranial abnormality. 2.  Stable cerebral atrophy and chronic small vessel disease.  Original Report Authenticated By: Danae Orleans, M.D.   Mr Brain Wo Contrast  12/09/2011  *RADIOLOGY REPORT*  Clinical Data: Seizures  MRI HEAD WITHOUT CONTRAST  Technique:  Multiplanar, multiecho pulse sequences of the brain and surrounding structures were obtained according to standard protocol without intravenous contrast.  Comparison: CT 12/08/2011  Findings:  Image quality degraded by patient motion.  Negative for acute infarct.  Generalized atrophy of a moderate to advanced degree.  Chronic ischemic changes in the cerebral white matter bilaterally and in the pons.  Negative for acute infarct.  Negative for hemorrhage or mass lesion.  Temporal lobes are normal bilaterally.  Chronic micro hemorrhage left thalamus and right frontal white matter.  These are likely related to chronic hypertension.  IMPRESSION: Atrophy and chronic ischemic changes.  No acute infarct or mass lesion.  Original Report Authenticated By: Camelia Phenes, M.D.   Dg Chest Port 1 View  12/09/2011  *RADIOLOGY REPORT*  Clinical Data: Cough, congestion.  Weakness.  PORTABLE CHEST - 1 VIEW  Comparison: 02/10/2007  Findings: Prior CABG.  Heart is borderline in size.  Bibasilar atelectasis.  No effusions.  No acute bony abnormality.  IMPRESSION: Bibasilar atelectasis.  Prior CABG.  Original Report Authenticated By: Aubery Lapping  Kearney Hard, M.D.   Medications: I have reviewed the patient's current medications.  Assessment/Plan: 77 years old man with epilepsy/dementia as per wife (patient has been more forgetful and confused lately) - currently on Keppra 500 mg PO bid and Dilantin 300 mg PO daily 1) EEG - no  epileptiform activity 2) PT eval 3) Optimize BP control 4) Outpatient neurology evaluation for dementia with Dr. Anne Hahn 5) Call with questions  LOS: 2 days   Yesica Kemler

## 2011-12-11 MED ORDER — LABETALOL HCL 200 MG PO TABS
200.0000 mg | ORAL_TABLET | Freq: Three times a day (TID) | ORAL | Status: DC
  Administered 2011-12-11 – 2011-12-14 (×10): 200 mg via ORAL
  Filled 2011-12-11 (×12): qty 1

## 2011-12-11 NOTE — Progress Notes (Signed)
Physical Therapy Evaluation Patient Details Name: Joseph Bentley MRN: [id number] DOB: [birth year] Today's Date: 12/11/2011  Problem List:  Patient Active Problem List  Diagnoses  . Seizure  . HTN (hypertension)  . Atrial fibrillation  . Anticoagulant long-term use  . Dehydration    Past Medical History:  Past Medical History  Diagnosis Date  . Hypertension   . Epilepsy   . CAD (coronary artery disease)   . Hx of CABG   . Stroke   . B12 deficiency   . Low testosterone   . BPH (benign prostatic hyperplasia)   . A-fib   . Fall   . Seizures    Past Surgical History:  Past Surgical History  Procedure Date  . Back surgery 05/2011    cement fusion of broken vertebrae  . Cardiac surgery   . Hernia repair   . Coronary artery bypass graft     PT Assessment/Plan/Recommendation PT Assessment Clinical Impression Statement: Pt s/p seizure with recent h/o hospitalization and falls (d/c'd to SNF x 4 weeks for therapy, and then finished 4 weeks of HH therapies).  Anticipate pt is nearly at baseline, however will further assess balance and safety with RW to assist with d/c planning and safe d/c home with wife. PT Recommendation/Assessment: Patient will need skilled PT in the acute care venue PT Problem List: Decreased balance;Decreased mobility;Decreased knowledge of use of DME PT Therapy Diagnosis : Difficulty walking PT Plan PT Frequency: Min 3X/week PT Treatment/Interventions: DME instruction;Gait training;Therapeutic activities;Balance training;Patient/family education PT Recommendation Follow Up Recommendations: Home health PT Equipment Recommended: None recommended by PT PT Goals  Acute Rehab PT Goals PT Goal Formulation: With patient Time For Goal Achievement: 7 days Pt will go Sit to Stand: with modified independence PT Goal: Sit to Stand - Progress: Not met Pt will go Stand to Sit: with modified independence PT Goal: Stand to Sit - Progress: Not met Pt will Transfer  Bed to Chair/Chair to Bed: with modified independence PT Transfer Goal: Bed to Chair/Chair to Bed - Progress: Not met Pt will Stand: with modified independence;3 - 5 min;with unilateral upper extremity support (while performing reaching activities >8 inches) PT Goal: Stand - Progress: Not met Pt will Ambulate: >150 feet;with modified independence PT Goal: Ambulate - Progress: Not met  PT Evaluation Precautions/Restrictions  Precautions Precautions: Fall Prior Functioning  Home Living Lives With: Spouse Type of Home:  (townhome) Home Layout: One level Home Access: Level entry Bathroom Shower/Tub: Health visitor: Handicapped height Bathroom Accessibility: Yes How Accessible: Accessible via walker (with wheels placed on inside) Home Adaptive Equipment: Walker - rolling Prior Function Level of Independence: Independent with basic ADLs;Requires assistive device for independence Able to Take Stairs?: Reciprically Driving: No Comments: Patient used RW for ambulation. Wife does all driving. Patient states he recently finished PT/OT at [facility] followed by 4 weeks of therapy at home.  Cognition Cognition Arousal/Alertness: Awake/alert Overall Cognitive Status: Appears within functional limits for tasks assessed Orientation Level: Oriented X4 Sensation/Coordination Sensation Light Touch: Appears Intact Coordination Gross Motor Movements are Fluid and Coordinated: Yes Fine Motor Movements are Fluid and Coordinated: Yes Extremity Assessment RUE Assessment RUE Assessment: Within Functional Limits LUE Assessment LUE Assessment: Within Functional Limits RLE Assessment RLE Assessment: Within Functional Limits LLE Assessment LLE Assessment: Within Functional Limits Mobility (including Balance) Bed Mobility Supine to Sit: 6: Modified independent (Device/Increase time) (HOB elevated to ~20degrees) Transfers Sit to Stand: 5: Supervision;From bed;From  chair/3-in-1;With armrests Sit to Stand Details (indicate cue type  and reason): Patient able to demonstrate proper placement of hands for sit to stand and stand to sit Stand to Sit: 5: Supervision;To chair/3-in-1;With armrests;To bed Ambulation/Gait Ambulation/Gait: Yes Ambulation/Gait Assistance: 5: Supervision Ambulation/Gait Assistance Details (indicate cue type and reason): Pt required cues for proper placement of RW (to keep it closer to body) to incr safety; slow velocity; cues for upright posture (very kyphotic--can almost reverse kyphosis with cuing) Ambulation Distance (Feet): 80 Feet Assistive device: Rolling walker Gait Pattern: Decreased step length - right;Decreased step length - left;Right flexed knee in stance;Left flexed knee in stance;Right foot flat;Left foot flat  Posture/Postural Control Posture/Postural Control: Postural limitations Postural Limitations: very kyphotic Exercise    End of Session PT - End of Session Equipment Utilized During Treatment: Gait belt Activity Tolerance: Patient tolerated treatment well Patient left: in chair;with call bell in reach Nurse Communication: Mobility status for ambulation General Behavior During Session: [facility] for tasks performed Cognition: [facility] for tasks performed  Joseph Bentley 12/11/2011, 12:37 PM Pager [phone]

## 2011-12-11 NOTE — Progress Notes (Signed)
Patient ID: Joseph Bentley, male   DOB: [birth year], 77 y.o.   MRN: [id number] Subjective: No events overnight. Patient denies chest pain, shortness of breath, abdominal pain. No reports of seizure like activity in past 24 hours.  Objective:  Vital signs in last 24 hours:  Filed Vitals:   12/11/11 0107 12/11/11 0200 12/11/11 0556 12/11/11 0933  BP: 152/90 171/64 160/82 198/106  Pulse:  79 82 88  Temp:  98.3 F (36.8 C) 97.4 F (36.3 C) 97.9 F (36.6 C)  TempSrc:    Oral  Resp:  18 18 18   Height:      Weight:      SpO2:  96% 96% 95%    Intake/Output from previous day:   Intake/Output Summary (Last 24 hours) at 12/11/11 1148 Last data filed at 12/11/11 9147  Gross per 24 hour  Intake    825 ml  Output    625 ml  Net    200 ml    Physical Exam: General: Alert, awake, oriented, in no acute distress. HEENT: No bruits, no goiter. Moist mucous membranes, no scleral icterus, no conjunctival pallor. Heart: iregular rhythm, S1/S2 +, no murmurs, rubs, gallops. Lungs: Clear to auscultation bilaterally. No wheezing, no rhonchi, no rales.  Abdomen: Soft, nontender, nondistended, positive bowel sounds. Extremities: No clubbing or cyanosis, no pitting edema,  positive pedal pulses. Neuro: Grossly nonfocal.  Lab Results:  Basic Metabolic Panel:    Component Value Date/Time   NA 136 12/09/2011 0920   K 3.9 12/09/2011 0920   CL 101 12/09/2011 0920   CO2 29 12/09/2011 0920   BUN 18 12/09/2011 0920   CREATININE 0.87 12/09/2011 0920   CREATININE 0.83 05/01/2009 0934   GLUCOSE 104* 12/09/2011 0920   CALCIUM 8.7 12/09/2011 0920   CALCIUM 8.8 07/09/2011 0545   CBC:    Component Value Date/Time   WBC 6.7 12/09/2011 0920   WBC 6.0 10/22/2010 1125   HGB 15.7 12/09/2011 0920   HGB 17.3* 10/22/2010 1125   HCT 46.4 12/09/2011 0920   HCT 51.1* 10/22/2010 1125   PLT 154 12/09/2011 0920   PLT 173 10/22/2010 1125   MCV 97.3 12/09/2011 0920   MCV 96.0 10/22/2010 1125   NEUTROABS 4.8  11/30/2011 1847   NEUTROABS 4.5 10/22/2010 1125   LYMPHSABS 0.9 11/30/2011 1847   LYMPHSABS 0.7* 10/22/2010 1125   MONOABS 1.0 11/30/2011 1847   MONOABS 0.6 10/22/2010 1125   EOSABS 0.3 11/30/2011 1847   EOSABS 0.1 10/22/2010 1125   BASOSABS 0.0 11/30/2011 1847   BASOSABS 0.0 10/22/2010 1125      Lab 12/09/11 0920 12/08/11 1900  WBC 6.7 12.4*  HGB 15.7 16.8  HCT 46.4 48.3  PLT 154 153  MCV 97.3 96.2  MCH 32.9 33.5  MCHC 33.8 34.8  RDW 14.4 14.5  LYMPHSABS -- --  MONOABS -- --  EOSABS -- --  BASOSABS -- --  BANDABS -- --    Lab 12/09/11 0920 12/08/11 1900  NA 136 134*  K 3.9 4.5  CL 101 98  CO2 29 21  GLUCOSE 104* 128*  BUN 18 23  CREATININE 0.87 0.85  CALCIUM 8.7 8.9  MG 2.3 --    Lab 12/08/11 1900  INR 2.05*  PROTIME --   Cardiac markers:  Lab 12/09/11 2355 12/09/11 1556 12/09/11 0920  CKMB 4.9* 5.0* 5.0*  TROPONINI <0.30 <0.30 <0.30  MYOGLOBIN -- -- --   No components found with this basename: POCBNP:3 Recent Results (from the past 240  hour(s))  MRSA PCR SCREENING     Status: Normal   Collection Time   12/09/11  1:30 AM      Component Value Range Status Comment   MRSA by PCR NEGATIVE  NEGATIVE  Final     Studies/Results: Mr Brain Wo Contrast  12/09/2011 IMPRESSION: Atrophy and chronic ischemic changes.  No acute infarct or mass lesion.     Medications: Scheduled Meds:   . cholecalciferol  2,000 Units Oral UD  . docusate sodium  100 mg Oral QHS  . gabapentin  100 mg Oral QHS  . hydrALAZINE  50 mg Oral Q6H  . labetalol  200 mg Oral TID  . levETIRAcetam  500 mg Oral BID  . olmesartan  20 mg Oral Daily  . pantoprazole  40 mg Oral Q1200  . phenytoin  300 mg Oral Daily  . DISCONTD: labetalol  10 mg Intravenous Once  . DISCONTD: metoprolol  100 mg Oral BID   Continuous Infusions:   . sodium chloride 75 mL/hr at 12/11/11 0600   PRN Meds:.acetaminophen, acetaminophen, LORazepam, morphine, ondansetron (ZOFRAN) IV, ondansetron, DISCONTD:  hydrALAZINE  Assessment/Plan:  Principal Problem:   *SEIZURE DISORDER - patient is on Dilantin 300 mg daily and Keppra 500 mg twice daily - No seizure activity reported in last 24 hours - As per neurology patient can followup on an outpatient basis once discharged - follow up PT evaluation  Active Problems:  HYPERTENSION - Blood pressure not adequately controlled with the current regimen with hydralazine, Benicar and metoprolol - Will increase hydralazine to 50 mg 4 times daily and discontinue metoprolol; start labetalol 200 mg 3 times daily - Continue to monitor blood pressure  ATRIAL FIBRILLATION - Patient is not on anticoagulation due to high risk of fall  EDUCATION - test results and diagnostic studies were discussed with patientat the bedside - patient has verbalized the understanding - questions were answered at the bedside and contact information was provided for additional questions or concerns   LOS: 3 days   Albertina Leise 12/11/2011, 11:48 AM  [facility] Pager: [phone]

## 2011-12-11 NOTE — Progress Notes (Signed)
12/11/2011 Fransico Michael SPARKS Case Management Note [phone]  HOME HEALTH AGENCIES SERVING [facility]   Agencies that are Medicare-Certified and are affiliated with [facility]  Telephone Number Address  [facility]   The [facility] has ownership interest in this company; however, you are under no obligation to use this agency. [phone] or  [phone] [address] [place], [place] [address]   Agencies that are Medicare-Certified and are not affiliated with [facility] Agency Telephone Number Address  [facility] [phone] Fax [phone] [address] [place], [place]  [address]  [facility] [phone] or [phone] Fax [phone] [address] [place], [place] [address]  [facility] [phone] Fax [phone] [address] [place], [place] [address]  [facility] [phone] Fax [phone] [address] [place], [place]  [address]  [facility] [phone] Fax [phone] [address], Suite [place], [place] [address]  [facility] [phone] [address] [place], [place] [address]  [facility] [phone]  [address] [place], [place] [address]  [facility] [phone] or [phone] Fax [phone] [address] [place], [place]  [address]  [facility] [phone] Fax [phone] [address] [place], [place]  [address]  [facility]  [phone] Fax [phone] [address] [place], [place] [address]               Agencies that are not Medicare-Certified and are not affiliated with [facility] Agency Telephone Number Address  [facility] [place] [phone] or [phone] Fax [phone] [address], [place]  [address]  [facility] [phone] Fax [phone] [address] [place], [place]  [address]  [facility]  [phone] Fax [phone] [address] [place], [place] [address]  [facility] [phone] Fax [phone] [address] [place], [place] [address]  [facility] [phone] or [phone] Fax [phone] [address] [place], [place]  [address]  [facility] [phone] or [phone] Fax [phone] [address] [place], [place]  [address]  [facility] [phone] Fax [phone] [address] [place], [place]  [address]  [facility] [phone] [address] [place], [place]  [address]  [facility] [phone] Fax [phone] [address] [place], [place]  [address]  [facility] [phone] Fax [phone] [address] [place], [place]  [address]  [facility] [phone] Fax [phone] [address] [place], [place] [address]  [facility] [phone] Fax [phone] [address] [place], [place]  [address]   Noted recommendation from PT for home health services. Choice of  agencies given to patient. Will let NCM know decision.

## 2011-12-11 NOTE — Progress Notes (Signed)
Occupational Therapy Evaluation Patient Details Name: Joseph Bentley MRN: [id number] DOB: 26-Jul-1926 Today's Date: 12/11/2011  Problem List:  Patient Active Problem List  Diagnoses  . Seizure  . HTN (hypertension)  . Atrial fibrillation  . Anticoagulant long-term use  . Dehydration    Past Medical History:  Past Medical History  Diagnosis Date  . Hypertension   . Epilepsy   . CAD (coronary artery disease)   . Hx of CABG   . Stroke   . B12 deficiency   . Low testosterone   . BPH (benign prostatic hyperplasia)   . A-fib   . Fall   . Seizures    Past Surgical History:  Past Surgical History  Procedure Date  . Back surgery 05/2011    cement fusion of broken vertebrae  . Cardiac surgery   . Hernia repair   . Coronary artery bypass graft     OT Assessment/Plan/Recommendation OT Assessment Clinical Impression Statement: Patient admitted for seizures and is near baseline functioning. Will benefit from skilled OT in the acute setting to maximize independence to facilitate safe d/c home with wife OT Recommendation/Assessment: Patient will need skilled OT in the acute care venue OT Problem List: Decreased activity tolerance;Decreased knowledge of use of DME or AE OT Therapy Diagnosis : Generalized weakness OT Plan OT Frequency: Min 2X/week OT Treatment/Interventions: Self-care/ADL training;DME and/or AE instruction;Patient/family education;Therapeutic activities OT Recommendation Follow Up Recommendations: None Equipment Recommended: None recommended by OT Individuals Consulted Consulted and Agree with Results and Recommendations: Patient OT Goals Acute Rehab OT Goals OT Goal Formulation: With patient Time For Goal Achievement: 7 days ADL Goals Pt Will Perform Upper Body Bathing: Independently (standing in shower) ADL Goal: Upper Body Bathing - Progress: Progressing toward goals Pt Will Perform Lower Body Bathing: Independently (standing in shower) ADL Goal: Lower  Body Bathing - Progress: Progressing toward goals Pt Will Perform Tub/Shower Transfer: Shower transfer;Independently;Ambulation;with modified independence ADL Goal: Tub/Shower Transfer - Progress: Progressing toward goals  OT Evaluation Precautions/Restrictions  Precautions Precautions: Fall Prior Functioning Home Living Lives With: Spouse Type of Home:  (townhome) Home Layout: One level Home Access: Level entry Bathroom Shower/Tub: Health visitor: Handicapped height Bathroom Accessibility: Yes How Accessible: Accessible via walker (with wheels placed on inside) Home Adaptive Equipment: Walker - rolling Prior Function Level of Independence: Independent with basic ADLs;Requires assistive device for independence Able to Take Stairs?: Reciprically Driving: No Comments: Patient used RW for ambulation. Wife does all driving. Patient states he recently finished PT/[facility] followed by 4 weeks of therapy at home.  ADL ADL Eating/Feeding: Simulated;Independent Where Assessed - Eating/Feeding: Chair Grooming: Simulated;Supervision/safety Where Assessed - Grooming: Standing at sink Upper Body Bathing: Simulated;Supervision/safety Where Assessed - Upper Body Bathing: Standing at sink Lower Body Bathing: Simulated (Min guard assist) Where Assessed - Lower Body Bathing: Standing at sink Upper Body Dressing: Performed;Supervision/safety;Set up Where Assessed - Upper Body Dressing: Sitting, bed Lower Body Dressing: Performed;Set up;Supervision/safety Where Assessed - Lower Body Dressing: Sit to stand from bed Toilet Transfer: Simulated;Supervision/safety Toilet Transfer Details (indicate cue type and reason): simulated to/from chair with patient placing hands on seat of chair to simulate how he gets up from the toilet at home.  Toilet Transfer Method: Ambulating Toileting - Clothing Manipulation: Simulated;Supervision/safety Where Assessed - Glass blower/designer  Manipulation: Standing Toileting - Hygiene: Not assessed Tub/Shower Transfer: Not assessed ADL Comments: Patient near baseline functioning. Will benefit from walk-in-shower practice.  Vision/Perception  Vision - History Patient Visual Report: No change from  baseline Cognition Cognition Overall Cognitive Status: Appears within functional limits for tasks assessed Orientation Level: Oriented X4 Sensation/Coordination Sensation Light Touch: Appears Intact Coordination Gross Motor Movements are Fluid and Coordinated: Yes Fine Motor Movements are Fluid and Coordinated: Yes Extremity Assessment RUE Assessment RUE Assessment: Within Functional Limits LUE Assessment LUE Assessment: Within Functional Limits Mobility  Bed Mobility Supine to Sit: 6: Modified independent (Device/Increase time) (HOB elevated to ~20degrees) Transfers Sit to Stand: 5: Supervision;From bed;From chair/3-in-1;With armrests Sit to Stand Details (indicate cue type and reason): Patient able to demonstrate proper placement of hands for sit to stand and stand to sit Stand to Sit: 5: Supervision;To chair/3-in-1;With armrests;To bed End of Session OT - End of Session Equipment Utilized During Treatment: Gait belt Activity Tolerance: Patient tolerated treatment well Patient left: in chair;with call bell in reach Nurse Communication: Mobility status for transfers;Mobility status for ambulation General Behavior During Session: [facility] for tasks performed Cognition: [facility] for tasks performed   Pariss Hommes 12/11/2011, 11:14 AM

## 2011-12-12 MED ORDER — HYDRALAZINE HCL 50 MG PO TABS
100.0000 mg | ORAL_TABLET | Freq: Four times a day (QID) | ORAL | Status: DC
  Administered 2011-12-12 – 2011-12-14 (×8): 100 mg via ORAL
  Filled 2011-12-12 (×11): qty 2

## 2011-12-12 NOTE — Progress Notes (Signed)
Physical Therapy Treatment Patient Details Name: Joseph Bentley MRN: [id number] DOB: 05/13/1926 Today's Date: 12/12/2011  PT Assessment/Plan  PT - Assessment/Plan Comments on Treatment Session: PT stating he wants to go home. Aware of his balance deficits and reports they are long standing and that wife can provide needed S/Assist at d/C. Balance deficits likely multifactoral but postural abnormailites limit blanace strategies and available ROM. PT Plan: Discharge plan remains appropriate PT Goals  Acute Rehab PT Goals PT Goal: Sit to Stand - Progress: Progressing toward goal PT Goal: Stand to Sit - Progress: Progressing toward goal PT Goal: Ambulate - Progress: Progressing toward goal  PT Treatment Precautions/Restrictions  Precautions Precautions: Fall Mobility (including Balance) Bed Mobility Supine to Sit: 7: Independent Transfers Sit to Stand: 5: Supervision;Without upper extremity assist;From bed;4: Min assist Sit to Stand Details (indicate cue type and reason): initially min A d/t posterior lean Stand to Sit: 5: Supervision;With armrests;To chair/3-in-1;To bed Stand to Sit Details: Toileting with close S while standing to urinate Ambulation/Gait Ambulation/Gait: Yes Ambulation/Gait Assistance: 5: Supervision Ambulation/Gait Assistance Details (indicate cue type and reason): head down d/t postural abnormalities Ambulation Distance (Feet): 300 Feet Assistive device: Rolling walker Gait Pattern: Trunk flexed;Decreased step length - left;Decreased step length - right Gait velocity: head down, no increased LOB with conversation, with turns, also into bathroom Stairs: No Wheelchair Mobility Wheelchair Mobility: No  Posture/Postural Control Posture/Postural Control: Postural limitations Postural Limitations: kyphotic, head down to almost 90 degrees, sits in posterior pelvic tilt but can extend cervical, thoracic and lumber areas Balance Balance Assessed: Yes Static Sitting  Balance Static Sitting - Balance Support: Feet supported;No upper extremity supported Static Sitting - Level of Assistance: 7: Independent Static Sitting - Comment/# of Minutes: seated LAQ for fall prevention/quad strength Static Standing Balance Static Standing - Level of Assistance: 5: Stand by assistance;Other (comment) (mod A without RW, S with RW) Dynamic Standing Balance Dynamic Standing - Balance Support: Bilateral upper extremity supported Dynamic Standing - Level of Assistance:  (close S with RW, up to mod A without for exercsie) Dynamic Standing - Balance Activities: Lateral lean/weight shifting;Forward lean/weight shifting Dynamic Standing - Comments: toe raises, mini squats 10 x 2 with cues to bring weight over balls of feet to decreased posterior lean, passive stretch to increase trunk extension followed by A/ROM Exercise see above   End of Session PT - End of Session Equipment Utilized During Treatment: Gait belt Activity Tolerance: Patient tolerated treatment well Patient left: in chair General Behavior During Session: [facility] for tasks performed Cognition: [facility] for tasks performed [facility])  Larina Bras, [place] Caller 12/12/2011, 10:59 AM

## 2011-12-12 NOTE — Progress Notes (Signed)
Patient ID: Joseph Bentley, male   DOB: August 11, 1926, 77 y.o.   MRN: [id number] Subjective: No events overnight. Patient denies chest pain, shortness of breath, abdominal pain.  Objective:  Vital signs in last 24 hours:  Filed Vitals:   12/12/11 0249 12/12/11 0518 12/12/11 0952 12/12/11 1047  BP: 124/57 136/75 160/88   Pulse: 89 81 81 94  Temp: 97.9 F (36.6 C) 97.7 F (36.5 C) 97.7 F (36.5 C)   TempSrc: Oral Oral Oral   Resp: 20 18 18    Height:      Weight:      SpO2: 97% 94% 97% 97%    Intake/Output from previous day:   Intake/Output Summary (Last 24 hours) at 12/12/11 1426 Last data filed at 12/12/11 0900  Gross per 24 hour  Intake    750 ml  Output   1250 ml  Net   -500 ml    Physical Exam: General: Alert, awake, oriented x3, in no acute distress. HEENT: No bruits, no goiter. Moist mucous membranes, no scleral icterus, no conjunctival pallor. Heart: Regular rate and rhythm, S1/S2 +, no murmurs, rubs, gallops. Lungs: Clear to auscultation bilaterally. No wheezing, no rhonchi, no rales.  Abdomen: Soft, nontender, nondistended, positive bowel sounds. Extremities: No clubbing or cyanosis, no pitting edema,  positive pedal pulses. Neuro: Grossly nonfocal.  Lab Results:  Basic Metabolic Panel:    Component Value Date/Time   NA 136 12/09/2011 0920   K 3.9 12/09/2011 0920   CL 101 12/09/2011 0920   CO2 29 12/09/2011 0920   BUN 18 12/09/2011 0920   CREATININE 0.87 12/09/2011 0920   CREATININE 0.83 05/01/2009 0934   GLUCOSE 104* 12/09/2011 0920   CALCIUM 8.7 12/09/2011 0920   CALCIUM 8.8 07/09/2011 0545   CBC:    Component Value Date/Time   WBC 6.7 12/09/2011 0920   WBC 6.0 10/22/2010 1125   HGB 15.7 12/09/2011 0920   HGB 17.3* 10/22/2010 1125   HCT 46.4 12/09/2011 0920   HCT 51.1* 10/22/2010 1125   PLT 154 12/09/2011 0920   PLT 173 10/22/2010 1125   MCV 97.3 12/09/2011 0920   MCV 96.0 10/22/2010 1125   NEUTROABS 4.8 11/30/2011 1847   NEUTROABS 4.5 10/22/2010  1125   LYMPHSABS 0.9 11/30/2011 1847   LYMPHSABS 0.7* 10/22/2010 1125   MONOABS 1.0 11/30/2011 1847   MONOABS 0.6 10/22/2010 1125   EOSABS 0.3 11/30/2011 1847   EOSABS 0.1 10/22/2010 1125   BASOSABS 0.0 11/30/2011 1847   BASOSABS 0.0 10/22/2010 1125      Lab 12/09/11 0920 12/08/11 1900  WBC 6.7 12.4*  HGB 15.7 16.8  HCT 46.4 48.3  PLT 154 153  MCV 97.3 96.2  MCH 32.9 33.5  MCHC 33.8 34.8  RDW 14.4 14.5  LYMPHSABS -- --  MONOABS -- --  EOSABS -- --  BASOSABS -- --  BANDABS -- --    Lab 12/09/11 0920 12/08/11 1900  NA 136 134*  K 3.9 4.5  CL 101 98  CO2 29 21  GLUCOSE 104* 128*  BUN 18 23  CREATININE 0.87 0.85  CALCIUM 8.7 8.9  MG 2.3 --    Lab 12/08/11 1900  INR 2.05*  PROTIME --   Cardiac markers:  Lab 12/09/11 2355 12/09/11 1556 12/09/11 0920  CKMB 4.9* 5.0* 5.0*  TROPONINI <0.30 <0.30 <0.30  MYOGLOBIN -- -- --   No components found with this basename: POCBNP:3 Recent Results (from the past 240 hour(s))  MRSA PCR SCREENING  Status: Normal   Collection Time   12/09/11  1:30 AM      Component Value Range Status Comment   MRSA by PCR NEGATIVE  NEGATIVE  Final     Studies/Results: No results found.  Medications: Scheduled Meds:   . cholecalciferol  2,000 Units Oral UD  . docusate sodium  100 mg Oral QHS  . gabapentin  100 mg Oral QHS  . hydrALAZINE  100 mg Oral Q6H  . labetalol  200 mg Oral TID  . levETIRAcetam  500 mg Oral BID  . olmesartan  20 mg Oral Daily  . pantoprazole  40 mg Oral Q1200  . phenytoin  300 mg Oral Daily  . DISCONTD: hydrALAZINE  50 mg Oral Q6H   Continuous Infusions:   . sodium chloride 75 mL/hr at 12/12/11 0545   PRN Meds:.acetaminophen, acetaminophen, LORazepam, morphine, ondansetron (ZOFRAN) IV, ondansetron  Assessment/Plan:   Principal Problem:   *SEIZURE DISORDER  - patient is on Dilantin 300 mg daily and Keppra 500 mg twice daily  - No seizure activity reported in last 24 hours  - As per neurology patient  can followup on an outpatient basis once discharged  - follow up PT evaluation   Active Problems:   HYPERTENSION  - Will increase hydralazine to 100 mg 4 times daily and discontinue metoprolol; start labetalol 200 mg 3 times daily  - Continue to monitor blood pressure   ATRIAL FIBRILLATION  - Patient is not on anticoagulation due to high risk of fall   EDUCATION  - test results and diagnostic studies were discussed with patientat the bedside  - patient has verbalized the understanding  - questions were answered at the bedside and contact information was provided for additional questions or concerns     LOS: 4 days   Joseph Bentley 12/12/2011, 2:26 PM  [facility] Pager: [phone]

## 2011-12-13 DIAGNOSIS — Z0389 Encounter for observation for other suspected diseases and conditions ruled out: Secondary | ICD-10-CM

## 2011-12-13 LAB — BASIC METABOLIC PANEL
BUN: 12 mg/dL (ref 6–23)
CO2: 26 mEq/L (ref 19–32)
Chloride: 103 mEq/L (ref 96–112)
Creatinine, Ser: 0.95 mg/dL (ref 0.50–1.35)
GFR calc Af Amer: 87 mL/min — ABNORMAL LOW (ref >90)
Potassium: 4.3 mEq/L (ref 3.5–5.1)

## 2011-12-13 LAB — CBC
HCT: 44.1 % (ref 39.0–52.0)
Hemoglobin: 14.8 g/dL (ref 13.0–17.0)
MCV: 97.6 fL (ref 78.0–100.0)
RBC: 4.52 MIL/uL (ref 4.22–5.81)
RDW: 14.4 % (ref 11.5–15.5)
WBC: 5.3 10*3/uL (ref 4.0–10.5)

## 2011-12-13 LAB — GLUCOSE, CAPILLARY

## 2011-12-13 NOTE — Progress Notes (Signed)
Physical Therapy Treatment Patient Details Name: Joseph Bentley MRN: [id number] DOB: 12-27-1925 Today's Date: 12/13/2011  PT Assessment/Plan  PT - Assessment/Plan Comments on Treatment Session: pt seems very aware of deficits and is eager to D/C to home with wife.   PT Plan: Discharge plan remains appropriate PT Frequency: Min 3X/week Follow Up Recommendations: Home health PT Equipment Recommended: None recommended by PT PT Goals  Acute Rehab PT Goals PT Goal: Sit to Stand - Progress: Progressing toward goal PT Goal: Stand to Sit - Progress: Progressing toward goal PT Transfer Goal: Bed to Chair/Chair to Bed - Progress: Progressing toward goal PT Goal: Stand - Progress: Progressing toward goal PT Goal: Ambulate - Progress: Progressing toward goal  PT Treatment Precautions/Restrictions  Precautions Precautions: Fall Mobility (including Balance) Bed Mobility Supine to Sit: 7: Independent Transfers Transfers: Yes Sit to Stand: 5: Supervision;With upper extremity assist;From bed Sit to Stand Details (indicate cue type and reason): better anterior lean today and good use of UEs.   Stand to Sit: 5: Supervision;With upper extremity assist;With armrests;To chair/3-in-1 Ambulation/Gait Ambulation/Gait: Yes Ambulation/Gait Assistance: 5: Supervision Ambulation/Gait Assistance Details (indicate cue type and reason): pt very kyphotic, but good at looking up to scan environment.   Ambulation Distance (Feet): 500 Feet Assistive device: Rolling walker Gait Pattern: Decreased stride length;Trunk flexed Stairs: No Wheelchair Mobility Wheelchair Mobility: No  Posture/Postural Control Posture/Postural Control: Postural limitations Postural Limitations: kyphotic, head down to almost 90 degrees, sits in posterior pelvic tilt but can extend cervical, thoracic and lumber areas Exercise    End of Session PT - End of Session Equipment Utilized During Treatment: Gait belt Activity Tolerance:  Patient tolerated treatment well Patient left: in chair;with call bell in reach Nurse Communication: Mobility status for ambulation General Behavior During Session: [facility] for tasks performed Cognition: [facility] for tasks performed  Joseph Bentley, [place] [phone] 12/13/2011, 2:08 PM

## 2011-12-13 NOTE — Progress Notes (Signed)
Patient ID: KAEDON FANELLI, male   DOB: October 18, 1926, 77 y.o.   MRN: [id number] Subjective: No events overnight. Patient denies chest pain, shortness of breath, abdominal pain. Objective:  Vital signs in last 24 hours:  Filed Vitals:   12/13/11 0631 12/13/11 0702 12/13/11 1028 12/13/11 1504  BP:  186/83 162/82 138/68  Pulse: 94  75 70  Temp: 97.4 F (36.3 C)  98.2 F (36.8 C) 98.5 F (36.9 C)  TempSrc: Oral  Oral Oral  Resp: 18  16 16   Height:      Weight:      SpO2: 95%  96% 96%    Intake/Output from previous day:   Intake/Output Summary (Last 24 hours) at 12/13/11 1739 Last data filed at 12/13/11 1500  Gross per 24 hour  Intake   1200 ml  Output    750 ml  Net    450 ml    Physical Exam: General: Alert, awake, oriented x3, in no acute distress. HEENT: No bruits, no goiter. Moist mucous membranes, no scleral icterus, no conjunctival pallor. Heart: Regular rate and rhythm, S1/S2 +, no murmurs, rubs, gallops. Lungs: Clear to auscultation bilaterally. No wheezing, no rhonchi, no rales.  Abdomen: Soft, nontender, nondistended, positive bowel sounds. Extremities: No clubbing or cyanosis, no pitting edema,  positive pedal pulses. Neuro: Grossly nonfocal.  Lab Results:  Basic Metabolic Panel:    Component Value Date/Time   NA 139 12/13/2011 0645   K 4.3 12/13/2011 0645   CL 103 12/13/2011 0645   CO2 26 12/13/2011 0645   BUN 12 12/13/2011 0645   CREATININE 0.95 12/13/2011 0645   CREATININE 0.83 05/01/2009 0934   GLUCOSE 95 12/13/2011 0645   CALCIUM 8.7 12/13/2011 0645   CALCIUM 8.8 07/09/2011 0545   CBC:    Component Value Date/Time   WBC 5.3 12/13/2011 0645   WBC 6.0 10/22/2010 1125   HGB 14.8 12/13/2011 0645   HGB 17.3* 10/22/2010 1125   HCT 44.1 12/13/2011 0645   HCT 51.1* 10/22/2010 1125   PLT 147* 12/13/2011 0645   PLT 173 10/22/2010 1125   MCV 97.6 12/13/2011 0645   MCV 96.0 10/22/2010 1125   NEUTROABS 4.8 11/30/2011 1847   NEUTROABS 4.5 10/22/2010 1125   LYMPHSABS 0.9 11/30/2011 1847   LYMPHSABS 0.7* 10/22/2010 1125   MONOABS 1.0 11/30/2011 1847   MONOABS 0.6 10/22/2010 1125   EOSABS 0.3 11/30/2011 1847   EOSABS 0.1 10/22/2010 1125   BASOSABS 0.0 11/30/2011 1847   BASOSABS 0.0 10/22/2010 1125      Lab 12/13/11 0645 12/09/11 0920 12/08/11 1900  WBC 5.3 6.7 12.4*  HGB 14.8 15.7 16.8  HCT 44.1 46.4 48.3  PLT 147* 154 153  MCV 97.6 97.3 96.2  MCH 32.7 32.9 33.5  MCHC 33.6 33.8 34.8  RDW 14.4 14.4 14.5  LYMPHSABS -- -- --  MONOABS -- -- --  EOSABS -- -- --  BASOSABS -- -- --  BANDABS -- -- --    Lab 12/13/11 0645 12/09/11 0920 12/08/11 1900  NA 139 136 134*  K 4.3 3.9 4.5  CL 103 101 98  CO2 26 29 21   GLUCOSE 95 104* 128*  BUN 12 18 23   CREATININE 0.95 0.87 0.85  CALCIUM 8.7 8.7 8.9  MG -- 2.3 --    Lab 12/08/11 1900  INR 2.05*  PROTIME --   Cardiac markers:  Lab 12/09/11 2355 12/09/11 1556 12/09/11 0920  CKMB 4.9* 5.0* 5.0*  TROPONINI <0.30 <0.30 <0.30  MYOGLOBIN -- -- --  No components found with this basename: POCBNP:3 Recent Results (from the past 240 hour(s))  MRSA PCR SCREENING     Status: Normal   Collection Time   12/09/11  1:30 AM      Component Value Range Status Comment   MRSA by PCR NEGATIVE  NEGATIVE  Final     Studies/Results: No results found.  Medications: Scheduled Meds:   . cholecalciferol  2,000 Units Oral UD  . docusate sodium  100 mg Oral QHS  . gabapentin  100 mg Oral QHS  . hydrALAZINE  100 mg Oral Q6H  . labetalol  200 mg Oral TID  . levETIRAcetam  500 mg Oral BID  . olmesartan  20 mg Oral Daily  . pantoprazole  40 mg Oral Q1200  . phenytoin  300 mg Oral Daily   Continuous Infusions:   . sodium chloride 75 mL/hr at 12/13/11 1102   PRN Meds:.acetaminophen, acetaminophen, LORazepam, morphine, ondansetron (ZOFRAN) IV, ondansetron  Assessment/Plan:   Principal Problem:   *SEIZURE DISORDER  - patient is on Dilantin 300 mg daily and Keppra 500 mg twice daily  - No  seizure activity reported in last 24 hours  - As per neurology patient can followup on an outpatient basis once discharged  - follow up PT evaluation   Active Problems:   HYPERTENSION  - Will increase hydralazine to 100 mg 4 times daily and discontinue metoprolol; start labetalol 200 mg 3 times daily  - Continue to monitor blood pressure   ATRIAL FIBRILLATION  - Patient is not on anticoagulation due to high risk of fall   EDUCATION  - test results and diagnostic studies were discussed with patientat the bedside  - patient has verbalized the understanding  - questions were answered at the bedside and contact information was provided for additional questions or concerns       LOS: 5 days   Aideen Fenster 12/13/2011, 5:39 PM  [facility] Pager: [phone]

## 2011-12-13 NOTE — Consult Note (Signed)
Reason for Consult: Capacity evaluation Referring Physician: Yosgar Bentley is an 77 y.o. male.   HPI: Pt was admitted with seizures and post ictal confusion. He was under the care of [facility] and primary neurologist Lesia Sago for treatment. He has stabilized on his medication and planning for appropriate disposition. He has denied symptoms of depression, mania, anxiety and psychosis. He is retired [employer] and stays with his wife and helps around home. He has no deficits of mental status or cognitive problems beyond his age appropriateness. He has mild bilateral hearing impairment and needs to repeat some times. He is aware of his dilantin levels being high and his primary care physician recommended to reduce the medication, which is probable cause for relapse of seizure episodes.  Past Medical History  Diagnosis Date  . Hypertension   . Epilepsy   . CAD (coronary artery disease)   . Hx of CABG   . Stroke   . B12 deficiency   . Low testosterone   . BPH (benign prostatic hyperplasia)   . A-fib   . Fall   . Seizures     Past Surgical History  Procedure Date  . Back surgery 05/2011    cement fusion of broken vertebrae  . Cardiac surgery   . Hernia repair   . Coronary artery bypass graft     History reviewed. No pertinent family history.  Social History:  reports that he has never smoked. He has never used smokeless tobacco. He reports that he does not drink alcohol or use illicit drugs.  Allergies: No Known Allergies  Medications: I have reviewed the patient's current medications.  Results for orders placed during the hospital encounter of 12/08/11 (from the past 48 hour(s))  CBC     Status: Abnormal   Collection Time   12/13/11  6:45 AM      Component Value Range Comment   WBC 5.3  4.0 - 10.5 (K/uL)    RBC 4.52  4.22 - 5.81 (MIL/uL)    Hemoglobin 14.8  13.0 - 17.0 (g/dL)    HCT 16.1  09.6 - 04.5 (%)    MCV 97.6  78.0 -  100.0 (fL)    MCH 32.7  26.0 - 34.0 (pg)    MCHC 33.6  30.0 - 36.0 (g/dL)    RDW 40.9  81.1 - 91.4 (%)    Platelets 147 (*) 150 - 400 (K/uL)   BASIC METABOLIC PANEL     Status: Abnormal   Collection Time   12/13/11  6:45 AM      Component Value Range Comment   Sodium 139  135 - 145 (mEq/L)    Potassium 4.3  3.5 - 5.1 (mEq/L)    Chloride 103  96 - 112 (mEq/L)    CO2 26  19 - 32 (mEq/L)    Glucose, Bld 95  70 - 99 (mg/dL)    BUN 12  6 - 23 (mg/dL)    Creatinine, Ser 7.82  0.50 - 1.35 (mg/dL)    Calcium 8.7  8.4 - 10.5 (mg/dL)    GFR calc non Af Amer 75 (*) >90 (mL/min)    GFR calc Af Amer 87 (*) >90 (mL/min)   GLUCOSE, CAPILLARY     Status: Normal   Collection Time   12/13/11  6:46 AM      Component Value Range Comment   Glucose-Capillary 98  70 - 99 (mg/dL)    Comment 1 Documented in Chart  Comment 2 Notify RN       No results found.  No depression, No anxiety and No psychosis Blood pressure 138/68, pulse 70, temperature 98.5 F (36.9 C), temperature source Oral, resp. rate 16, height 5\' 9"  (1.753 m), weight 69.2 kg (152 lb 8.9 oz), SpO2 96.00%.   Assessment/Plan: He has capacity to make individual decisions about medication needs and living arrangement based on my evaluation and opinion. Please contact social services for appropriate disposition upon medically stable Appreciate psychiatric consult on this pateint  Aileene Lanum,JANARDHAHA R. 12/13/2011, 6:14 PM

## 2011-12-14 MED ORDER — LABETALOL HCL 200 MG PO TABS: 200.0000 mg | ORAL_TABLET | Freq: Three times a day (TID) | ORAL | Status: DC

## 2011-12-14 MED ORDER — HYDRALAZINE HCL 100 MG PO TABS: 100.0000 mg | ORAL_TABLET | Freq: Four times a day (QID) | ORAL | Status: DC

## 2011-12-14 MED ORDER — LEVETIRACETAM 500 MG PO TABS: 500.0000 mg | ORAL_TABLET | Freq: Two times a day (BID) | ORAL | Status: DC

## 2011-12-14 NOTE — Discharge Summary (Signed)
Patient ID: Joseph Bentley MRN: [id number] DOB/AGE: [birth year] 77 y.o.  Admit date: 12/08/2011 Discharge date: 12/14/2011  Primary Care Physician:  No primary provider on file.  Discharge Diagnoses:    Present on Admission:  .Seizure .HTN (hypertension) .Atrial fibrillation .Dehydration  Principal Problem:  *Seizure Active Problems:  HTN (hypertension)  Atrial fibrillation  Dehydration   Current Discharge Medication List    START taking these medications   Details  hydrALAZINE (APRESOLINE) 100 MG tablet Take 1 tablet (100 mg total) by mouth every 6 (six) hours. Qty: 90 tablet, Refills: 3    labetalol (NORMODYNE) 200 MG tablet Take 1 tablet (200 mg total) by mouth 3 (three) times daily. Qty: 90 tablet, Refills: 3    levETIRAcetam (KEPPRA) 500 MG tablet Take 1 tablet (500 mg total) by mouth 2 (two) times daily. Qty: 60 tablet, Refills: 3      CONTINUE these medications which have NOT CHANGED   Details  cholecalciferol (VITAMIN D) 1000 UNITS tablet Take 2,000 Units by mouth as directed. Takes 1 tablet on the 1st and takes 1 tablet on the 15th of every month     docusate sodium (COLACE) 100 MG capsule Take 100 mg by mouth at bedtime.      gabapentin (NEURONTIN) 100 MG capsule Take 100 mg by mouth at bedtime.      metoprolol (TOPROL-XL) 100 MG 24 hr tablet Take 100 mg by mouth 2 (two) times daily.      pantoprazole (PROTONIX) 40 MG tablet Take 40 mg by mouth daily.      phenytoin (DILANTIN) 100 MG ER capsule Take 200 mg by mouth 2 (two) times daily.      valsartan (DIOVAN) 320 MG tablet Take 320 mg by mouth 2 (two) times daily.      VITAMIN B1-B12 IJ Inject 1,000 mcg as directed every 30 (thirty) days.      warfarin (COUMADIN) 5 MG tablet Take 5 mg by mouth at bedtime.          Disposition and Follow-up: With PCP in one week  Consults:  1. Neurology 2. psychiatry  Significant Diagnostic Studies:  Ct Head Wo Contrast  12/08/2011  *RADIOLOGY REPORT*   Clinical Data: Seizures.  Altered mental status.  Hypertension.  CT HEAD WITHOUT CONTRAST  Technique:  Contiguous axial images were obtained from the base of the skull through the vertex without contrast.  Comparison: 11/30/2011  Findings: There is no evidence of intracranial hemorrhage, brain edema or other signs of acute infarction.  There is no evidence of intracranial mass lesion or mass effect.  No abnormal extra-axial fluid collections are identified.  Moderate cerebral atrophy and mild chronic small vessel disease is again seen and stable in appearance.  Ventricles are stable in size.  No skull abnormality identified.  IMPRESSION:  1.  No acute intracranial abnormality. 2.  Stable cerebral atrophy and chronic small vessel disease.  Original Report Authenticated By: Danae Orleans, M.D.   Mr Brain Wo Contrast  12/09/2011  *RADIOLOGY REPORT*  Clinical Data: Seizures  MRI HEAD WITHOUT CONTRAST  Technique:  Multiplanar, multiecho pulse sequences of the brain and surrounding structures were obtained according to standard protocol without intravenous contrast.  Comparison: CT 12/08/2011  Findings:  Image quality degraded by patient motion.  Negative for acute infarct.  Generalized atrophy of a moderate to advanced degree.  Chronic ischemic changes in the cerebral white matter bilaterally and in the pons.  Negative for acute infarct.  Negative for hemorrhage or mass  lesion.  Temporal lobes are normal bilaterally.  Chronic micro hemorrhage left thalamus and right frontal white matter.  These are likely related to chronic hypertension.  IMPRESSION: Atrophy and chronic ischemic changes.  No acute infarct or mass lesion.  Original Report Authenticated By: Camelia Phenes, M.D.   Dg Chest Port 1 View  12/09/2011  *RADIOLOGY REPORT*  Clinical Data: Cough, congestion.  Weakness.  PORTABLE CHEST - 1 VIEW  Comparison: 02/10/2007  Findings: Prior CABG.  Heart is borderline in size.  Bibasilar atelectasis.  No effusions.   No acute bony abnormality.  IMPRESSION: Bibasilar atelectasis.  Prior CABG.  Original Report Authenticated By: Cyndie Chime, M.D.    Brief H and P: This is an 77 year old male, with known history of severe osteoporosis, s/p lumbar compression fractures, requiring kyphoplasty, seizure disorder, gait instability, falls, hypogonadism, B12 deficiency, MGUS, HTN, atrial fibrillation, on chronic anticoagulation, s/p remote CVA, CAD, peripheral neuropathy, OSA, DJD, brought to emergency department with recurrent seizures. Patient had 2 episodes prior to arrival in ED, and one in ED. At the time of the arrival patient was post ictal and confused. Patient was admitted to hospitalist service for further evaluation and management.   Physical Exam on Discharge:  Filed Vitals:   12/14/11 0045 12/14/11 0200 12/14/11 0600 12/14/11 1015  BP: 137/67 119/65 131/63 169/75  Pulse:  80 88 96  Temp:  99.2 F (37.3 C) 98.8 F (37.1 C) 98.3 F (36.8 C)  TempSrc:    Oral  Resp:  18 18 18   Height:      Weight:      SpO2:  94% 95% 95%     Intake/Output Summary (Last 24 hours) at 12/14/11 1143 Last data filed at 12/14/11 1030  Gross per 24 hour  Intake   1260 ml  Output   1450 ml  Net   -190 ml    General: Alert, awake, oriented x3, in no acute distress. HEENT: No bruits, no goiter. Heart: Regular rate and rhythm, without murmurs, rubs, gallops. Lungs: Clear to auscultation bilaterally. Abdomen: Soft, nontender, nondistended, positive bowel sounds. Extremities: No clubbing cyanosis or edema with positive pedal pulses. Neuro: Grossly intact, nonfocal.  CBC:    Component Value Date/Time   WBC 5.3 12/13/2011 0645   WBC 6.0 10/22/2010 1125   HGB 14.8 12/13/2011 0645   HGB 17.3* 10/22/2010 1125   HCT 44.1 12/13/2011 0645   HCT 51.1* 10/22/2010 1125   PLT 147* 12/13/2011 0645   PLT 173 10/22/2010 1125   MCV 97.6 12/13/2011 0645   MCV 96.0 10/22/2010 1125   NEUTROABS 4.8 11/30/2011 1847   NEUTROABS  4.5 10/22/2010 1125   LYMPHSABS 0.9 11/30/2011 1847   LYMPHSABS 0.7* 10/22/2010 1125   MONOABS 1.0 11/30/2011 1847   MONOABS 0.6 10/22/2010 1125   EOSABS 0.3 11/30/2011 1847   EOSABS 0.1 10/22/2010 1125   BASOSABS 0.0 11/30/2011 1847   BASOSABS 0.0 10/22/2010 1125    Basic Metabolic Panel:    Component Value Date/Time   NA 139 12/13/2011 0645   K 4.3 12/13/2011 0645   CL 103 12/13/2011 0645   CO2 26 12/13/2011 0645   BUN 12 12/13/2011 0645   CREATININE 0.95 12/13/2011 0645   CREATININE 0.83 05/01/2009 0934   GLUCOSE 95 12/13/2011 0645   CALCIUM 8.7 12/13/2011 0645   CALCIUM 8.8 07/09/2011 0545    Hospital Course:   Principal Problem:   *Seizure - conitune Keppra and dilantin on discharge and follow up with  neurology as an outpatient  Active Problems:   HTN (hypertension) - continue labetalol and hydralazine   Atrial fibrillation - Continue Coumadin   Dehydration - resolved  Disposition - Patient is medically stable and clinically appears well to be discharged home with home health physical therapy - Patient was evaluated by psychiatry for capacity and was determined to have a full capacity to make decision about his treatments  Education - Patient and family are aware of plan of care and treatment   Time spent on Discharge: Greater than 30 minutes  Signed: DEVINE, ALMA 12/14/2011, 11:43 AM

## 2011-12-14 NOTE — Progress Notes (Signed)
CARE MANAGEMENT NOTE 12/14/2011  Patient:  KEVAL, NAM   Account Number:  [id number]  Date Initiated:  12/14/2011  Documentation initiated by:  [facility]  Subjective/Objective Assessment:   HTN, AFib     Action/Plan:   lives at home with his wife   Anticipated DC Date:  12/14/2011   Anticipated DC Plan:  HOME W HOME HEALTH SERVICES      DC Planning Services  CM consult      [facility] Choice  HOME HEALTH   Choice offered to / List presented to:  C-3 Spouse        HH arranged  HH-2 PT      HH agency  [facility]   Status of service:  Completed, signed off Medicare Important Message given?   (If response is "NO", the following Medicare IM given date fields will be blank) Date Medicare IM given:   Date Additional Medicare IM given:    Discharge Disposition:  HOME W HOME HEALTH SERVICES  Per UR Regulation:    Comments:  12/14/2011 1230 Spoke to pt's wife. States they had [facility] but understand they are not accepting any new clients at this time. Did not have a second preference, other than a provider with availability and one that accept their insurance. Contacted [facility] and faxed orders for [facility] for pt's scheduled d/c today. Isidoro Donning RN CCM Case Mgmt phone [phone]

## 2012-01-10 ENCOUNTER — Encounter: Payer: Self-pay | Admitting: Cardiology

## 2012-01-14 ENCOUNTER — Ambulatory Visit (INDEPENDENT_AMBULATORY_CARE_PROVIDER_SITE_OTHER): Payer: Medicare Other | Admitting: Cardiology

## 2012-01-14 ENCOUNTER — Encounter: Payer: Self-pay | Admitting: Cardiology

## 2012-01-14 VITALS — BP 102/58 | HR 69 | Ht 69.0 in | Wt 160.0 lb

## 2012-01-14 DIAGNOSIS — I4891 Unspecified atrial fibrillation: Secondary | ICD-10-CM

## 2012-01-14 DIAGNOSIS — I10 Essential (primary) hypertension: Secondary | ICD-10-CM

## 2012-01-14 DIAGNOSIS — Z951 Presence of aortocoronary bypass graft: Secondary | ICD-10-CM

## 2012-01-14 DIAGNOSIS — I2510 Atherosclerotic heart disease of native coronary artery without angina pectoris: Secondary | ICD-10-CM | POA: Insufficient documentation

## 2012-01-14 MED ORDER — LABETALOL HCL 200 MG PO TABS: 200.0000 mg | ORAL_TABLET | Freq: Three times a day (TID) | ORAL | Status: DC

## 2012-01-14 MED ORDER — VALSARTAN 160 MG PO TABS: 160.0000 mg | ORAL_TABLET | Freq: Every day | ORAL | Status: DC

## 2012-01-14 MED ORDER — HYDRALAZINE HCL 100 MG PO TABS: 100.0000 mg | ORAL_TABLET | Freq: Four times a day (QID) | ORAL | Status: DC

## 2012-01-14 MED ORDER — METOPROLOL SUCCINATE ER 100 MG PO TB24: 100.0000 mg | ORAL_TABLET | Freq: Every day | ORAL | Status: DC

## 2012-01-14 NOTE — Patient Instructions (Signed)
Reduce your Toprol XL to 100 mg once a day.  Continue your other medications.  I will see you again in 6 months.

## 2012-01-14 NOTE — Assessment & Plan Note (Addendum)
Amazingly he is able to tolerate very high doses of beta blocker therapy. I'm concerned about the level of beta blockade he is on. I recommended reducing his metoprolol to 100 mg per day we will continue his labetalol at its current dose. He is on chronic anticoagulation with Coumadin. He has a moderate risk for fall but has not fallen recently.

## 2012-01-14 NOTE — Progress Notes (Signed)
Joseph Bentley Date of Birth: Sep 09, 1926 Medical Record #[id number]  History of Present Illness: Joseph Bentley is seen today to establish cardiac care. He is a former patient of Joseph Bentley. He has a history of chronic atrial fibrillation and has been on chronic anticoagulant therapy. He also has a history of coronary disease and is status post CABG in 2001. He has a history of hypertension and seizure disorder. He reports that he had a stroke in July and was hospitalized. He was then in a rehabilitation facility for 4 weeks. He was hospitalized in December with 2 seizures. Patient reports initially his heart rate was quite low but it appears that during his hospital stay his heart rate was increased as was his blood pressure in addition to his prior medicines including metoprolol he was started on labetalol and hydralazine. At this time he is taking Toprol 100 mg twice a day as well as labetalol 200 mg 3 times a day. He does report feeling lightheaded at times but denies any syncope or falls. He denies any chest pain or shortness of breath. His last coronary evaluation and was cardiac catheterization in 2008 which demonstrated patency of all his grafts.  Current Outpatient Prescriptions on File Prior to Visit  Medication Sig Dispense Refill  . cholecalciferol (VITAMIN D) 1000 UNITS tablet Take 2,000 Units by mouth as directed. Takes 1 tablet on the 1st and takes 1 tablet on the 15th of every month       . docusate sodium (COLACE) 100 MG capsule Take 100 mg by mouth at bedtime.        . gabapentin (NEURONTIN) 100 MG capsule Take 100 mg by mouth at bedtime.        . levETIRAcetam (KEPPRA) 500 MG tablet Take 1 tablet (500 mg total) by mouth 2 (two) times daily.  60 tablet  3  . pantoprazole (PROTONIX) 40 MG tablet Take 40 mg by mouth daily.        . phenytoin (DILANTIN) 100 MG ER capsule Take 200 mg by mouth 2 (two) times daily. 200 mg daily and 100 mg night      . VITAMIN B1-B12 IJ Inject 1,000 mcg as  directed every 30 (thirty) days.        Marland Kitchen warfarin (COUMADIN) 5 MG tablet Take 5 mg by mouth at bedtime.          No Known Allergies  Past Medical History  Diagnosis Date  . Hypertension   . Epilepsy   . CAD (coronary artery disease)   . Hx of CABG   . Stroke   . B12 deficiency   . Low testosterone   . BPH (benign prostatic hyperplasia)   . A-fib   . Fall   . Seizures   . Hyperlipidemia   . Chronic anticoagulation     Past Surgical History  Procedure Date  . Back surgery 05/2011    cement fusion of broken vertebrae  . Cardiac surgery   . Hernia repair   . Coronary artery bypass graft   . Cardiac catheterization 02/11/2007    EF 55-60%  . Cardiac catheterization 01/04/2005    EF 55%  . Angioplasty 1987  . Hernia repair   . Pilonidal cyst excision   . US echocardiography 01/18/2009    EF 55-60%  . US echocardiography 03/11/2003    EF 55-60%  . Cardiovascular stress test 11/05/2004    NO EVIDENCE OF ISCHEMIA    History  Smoking status  .  Former Smoker  . Quit date: 12/16/1978  Smokeless tobacco  . Never Used    History  Alcohol Use No    Family History  Problem Relation Age of Onset  . Heart disease Mother   . Heart disease Brother   . Heart disease Brother     Review of Systems: The review of systems is positive for history of recurrent seizures. He is unsteady on his feet and walks with a walker..  All other systems were reviewed and are negative.  Physical Exam: BP 102/58  Pulse 69  Ht 5\' 9"  (1.753 m)  Wt 160 lb (72.576 kg)  BMI 23.63 kg/m2 he is an elderly white male in no acute distress. He has significant kyphosis. He is normocephalic, atraumatic. Pupils are equal round and reactive to light accommodation. Sclera are clear. Oropharynx is clear. Neck is supple without JVD, adenopathy, thyromegaly, or bruits. Lungs are clear. Cardiac exam reveals an irregular rate and rhythm with a soft systolic ejection murmur at the right upper sternal border.  Abdomen is soft and nontender without organomegaly. He has no significant edema. Pedal pulses are palpable. His gait is unsteady.  LABORATORY DATA: ECG today demonstrates atrial fibrillation with a rate of 69 beats per minute there is a nonspecific ST abnormality.  Assessment / Plan:

## 2012-01-14 NOTE — Assessment & Plan Note (Signed)
Blood pressure is well controlled on exam today. He is on multiple antihypertensive agents.

## 2012-01-14 NOTE — Assessment & Plan Note (Signed)
Remote history of coronary bypass surgery in 2001. His last cardiac catheterization in 2008 showed patent grafts. He is asymptomatic. We will continue with his current therapy.

## 2012-01-20 ENCOUNTER — Telehealth: Payer: Self-pay | Admitting: Cardiovascular Disease

## 2012-01-20 NOTE — Telephone Encounter (Signed)
Dr [place] pt needed 30 days worth of meds not 90 days, told him to call pharmacy and inform to fill 30 days, pt agreed to plan.

## 2012-01-20 NOTE — Telephone Encounter (Signed)
New msg: Pt calling wanting to speak with nurse regarding pt RX. Please return pt call to discuss further.

## 2012-01-21 NOTE — Progress Notes (Signed)
Addended by: Judithe Modest D on: 01/21/2012 05:18 PM   Modules accepted: Orders

## 2012-12-07 ENCOUNTER — Other Ambulatory Visit: Payer: Self-pay | Admitting: *Deleted

## 2012-12-07 MED ORDER — LABETALOL HCL 200 MG PO TABS: 200.0000 mg | ORAL_TABLET | Freq: Three times a day (TID) | ORAL | Status: DC

## 2013-01-03 ENCOUNTER — Emergency Department (HOSPITAL_COMMUNITY): Payer: Medicare Other

## 2013-01-03 ENCOUNTER — Encounter (HOSPITAL_COMMUNITY): Payer: Self-pay

## 2013-01-03 ENCOUNTER — Emergency Department (HOSPITAL_COMMUNITY)
Admission: EM | Admit: 2013-01-03 | Discharge: 2013-01-04 | Disposition: A | Discharge status: Home / Self Care | Payer: Medicare Other | Attending: Emergency Medicine | Admitting: Emergency Medicine

## 2013-01-03 DIAGNOSIS — N400 Enlarged prostate without lower urinary tract symptoms: Secondary | ICD-10-CM | POA: Insufficient documentation

## 2013-01-03 DIAGNOSIS — Z008 Encounter for other general examination: Secondary | ICD-10-CM | POA: Insufficient documentation

## 2013-01-03 DIAGNOSIS — I2510 Atherosclerotic heart disease of native coronary artery without angina pectoris: Secondary | ICD-10-CM | POA: Insufficient documentation

## 2013-01-03 DIAGNOSIS — F29 Unspecified psychosis not due to a substance or known physiological condition: Secondary | ICD-10-CM

## 2013-01-03 DIAGNOSIS — Z8673 Personal history of transient ischemic attack (TIA), and cerebral infarction without residual deficits: Secondary | ICD-10-CM | POA: Insufficient documentation

## 2013-01-03 DIAGNOSIS — Z8639 Personal history of other endocrine, nutritional and metabolic disease: Secondary | ICD-10-CM | POA: Insufficient documentation

## 2013-01-03 DIAGNOSIS — I10 Essential (primary) hypertension: Secondary | ICD-10-CM | POA: Insufficient documentation

## 2013-01-03 DIAGNOSIS — Z8679 Personal history of other diseases of the circulatory system: Secondary | ICD-10-CM | POA: Insufficient documentation

## 2013-01-03 DIAGNOSIS — Z862 Personal history of diseases of the blood and blood-forming organs and certain disorders involving the immune mechanism: Secondary | ICD-10-CM | POA: Insufficient documentation

## 2013-01-03 DIAGNOSIS — E785 Hyperlipidemia, unspecified: Secondary | ICD-10-CM | POA: Insufficient documentation

## 2013-01-03 DIAGNOSIS — G40909 Epilepsy, unspecified, not intractable, without status epilepticus: Secondary | ICD-10-CM | POA: Insufficient documentation

## 2013-01-03 LAB — COMPREHENSIVE METABOLIC PANEL
ALT: 8 U/L (ref 0–53)
AST: 17 U/L (ref 0–37)
CO2: 25 mEq/L (ref 19–32)
Chloride: 99 mEq/L (ref 96–112)
GFR calc non Af Amer: 52 mL/min — ABNORMAL LOW (ref >90)
Glucose, Bld: 113 mg/dL — ABNORMAL HIGH (ref 70–99)
Sodium: 135 mEq/L (ref 135–145)
Total Bilirubin: 0.4 mg/dL (ref 0.3–1.2)

## 2013-01-03 LAB — URINALYSIS, ROUTINE W REFLEX MICROSCOPIC
Bilirubin Urine: NEGATIVE
Ketones, ur: NEGATIVE mg/dL
Nitrite: NEGATIVE
Protein, ur: NEGATIVE mg/dL
Urobilinogen, UA: 0.2 mg/dL (ref 0.0–1.0)
pH: 6 (ref 5.0–8.0)

## 2013-01-03 LAB — CBC WITH DIFFERENTIAL/PLATELET
Basophils Absolute: 0 10*3/uL (ref 0.0–0.1)
HCT: 48.9 % (ref 39.0–52.0)
Lymphocytes Relative: 10 % — ABNORMAL LOW (ref 12–46)
Lymphs Abs: 0.6 10*3/uL — ABNORMAL LOW (ref 0.7–4.0)
Monocytes Absolute: 0.8 10*3/uL (ref 0.1–1.0)
Neutro Abs: 4.2 10*3/uL (ref 1.7–7.7)
Platelets: 190 10*3/uL (ref 150–400)
RBC: 5.07 MIL/uL (ref 4.22–5.81)
RDW: 13.8 % (ref 11.5–15.5)
WBC: 5.9 10*3/uL (ref 4.0–10.5)

## 2013-01-03 LAB — ETHANOL: Alcohol, Ethyl (B): <11 mg/dL (ref 0–11)

## 2013-01-03 LAB — RAPID URINE DRUG SCREEN, HOSP PERFORMED: Barbiturates: NOT DETECTED

## 2013-01-03 LAB — PROTIME-INR
INR: 2.1 — ABNORMAL HIGH (ref 0.00–1.49)
Prothrombin Time: 22.7 seconds — ABNORMAL HIGH (ref 11.6–15.2)

## 2013-01-03 MED ORDER — DOCUSATE SODIUM 100 MG PO CAPS
100.0000 mg | ORAL_CAPSULE | Freq: Every day | ORAL | Status: DC
  Administered 2013-01-04: 100 mg via ORAL
  Filled 2013-01-03: qty 1

## 2013-01-03 MED ORDER — WARFARIN SODIUM 5 MG PO TABS: 5.0000 mg | ORAL_TABLET | ORAL | Status: DC

## 2013-01-03 MED ORDER — PHENYTOIN SODIUM EXTENDED 100 MG PO CAPS
200.0000 mg | ORAL_CAPSULE | Freq: Two times a day (BID) | ORAL | Status: DC
  Administered 2013-01-04 (×2): 200 mg via ORAL
  Filled 2013-01-03 (×2): qty 2

## 2013-01-03 MED ORDER — PANTOPRAZOLE SODIUM 40 MG PO TBEC
40.0000 mg | DELAYED_RELEASE_TABLET | Freq: Every day | ORAL | Status: DC
  Administered 2013-01-04: 40 mg via ORAL
  Filled 2013-01-03: qty 1

## 2013-01-03 MED ORDER — WARFARIN - PHYSICIAN DOSING INPATIENT: Freq: Every day | Status: DC

## 2013-01-03 MED ORDER — LABETALOL HCL 200 MG PO TABS
200.0000 mg | ORAL_TABLET | Freq: Three times a day (TID) | ORAL | Status: DC
  Administered 2013-01-04 (×2): 200 mg via ORAL
  Filled 2013-01-03 (×4): qty 1

## 2013-01-03 MED ORDER — LEVETIRACETAM 500 MG PO TABS
500.0000 mg | ORAL_TABLET | Freq: Two times a day (BID) | ORAL | Status: DC
  Administered 2013-01-04 (×2): 500 mg via ORAL
  Filled 2013-01-03 (×3): qty 1

## 2013-01-03 MED ORDER — RISPERIDONE 0.5 MG PO TABS
0.2500 mg | ORAL_TABLET | Freq: Two times a day (BID) | ORAL | Status: DC
  Administered 2013-01-04 (×2): 0.25 mg via ORAL
  Filled 2013-01-03: qty 2
  Filled 2013-01-03: qty 1

## 2013-01-03 MED ORDER — WARFARIN SODIUM 2.5 MG PO TABS
2.5000 mg | ORAL_TABLET | ORAL | Status: DC
Start: 2013-01-04 — End: 2013-01-04
  Filled 2013-01-03: qty 1

## 2013-01-03 MED ORDER — HYDRALAZINE HCL 100 MG PO TABS
100.0000 mg | ORAL_TABLET | Freq: Four times a day (QID) | ORAL | Status: DC
Start: 2013-01-04 — End: 2013-01-04
  Administered 2013-01-04 (×3): 100 mg via ORAL
  Filled 2013-01-03 (×7): qty 1

## 2013-01-03 NOTE — [facility] Counselor (Addendum)
Dr. Adela Glimpse telepsych report recommends d/c. She recommends pt being evaluated to see if there is an organic cause to his psychosis. Pt's RN notified EDP of telepsych rec and this Clinical research associate then notified.  Evette Cristal, [place] Assessment Counselor

## 2013-01-03 NOTE — ED Notes (Signed)
Patient placed in blue scrubs, patient and belongings wanded by security.

## 2013-01-03 NOTE — ED Notes (Signed)
Per family request, no phone directly calls to patient. Patient is NOT to make phone calls to his son.

## 2013-01-03 NOTE — ED Notes (Signed)
Patient's family states that they want the patient evalated for mental issues. Patient's wife states that it first started 3 years ago and has gotten progressively worse. Today the patient was beligerant with his family. Patient's wife states she has mentioned this to his PCP, but nothing has been done. Patient is very [facility]

## 2013-01-03 NOTE — ED Provider Notes (Signed)
History     CSN: [id number]  Arrival date & time 01/03/13  1446   First MD Initiated Contact with Patient 01/03/13 1521      Chief Complaint  Patient presents with  . Altered Mental Status  . Medical Clearance   PCP Per[facility]  (Consider location/radiation/quality/duration/timing/severity/associated sxs/prior treatment) HPI This 79 year old male is brought to the emergency room by his wife and daughter he does is gradual onset behavior changes over the last year. He is oriented to person place and time but over the last year has become progressively depressed, delusional, paranoid, and hallucinating, with short-term memory loss, he is accusing his wife of flirting with another man, he is accusing her dressing to show off "her pussy", he is accusing his wife and daughter for trying to attract other men, he is upset accusing her of telling him she wants a real man, he states he signed papers to give the rest of his retirement to his son instead of his wife because he accuses his wife is spending all his money, he has mood changes with sudden mood swings becomes angry and agitated and yells at her frequently, there've been no specific verbal threats and no physical violence although his wife is afraid that he may become violent. At baseline he has chronic atrial fibrillation has generalized weakness and walks with a walker. There is no treatment prior to arrival. The family states the patient's primary care doctor has not addressed his behavior changes. Past Medical History  Diagnosis Date  . Hypertension   . Epilepsy   . CAD (coronary artery disease)   . Hx of CABG   . Stroke   . B12 deficiency   . Low testosterone   . BPH (benign prostatic hyperplasia)   . A-fib   . Fall   . Seizures   . Hyperlipidemia   . Chronic anticoagulation     Past Surgical History  Procedure Date  . Back surgery 05/2011    cement fusion of broken vertebrae  . Cardiac surgery     . Hernia repair   . Coronary artery bypass graft   . Cardiac catheterization 02/11/2007    EF 55-60%  . Cardiac catheterization 01/04/2005    EF 55%  . Angioplasty 1987  . Hernia repair   . Pilonidal cyst excision   . US echocardiography 01/18/2009    EF 55-60%  . US echocardiography 03/11/2003    EF 55-60%  . Cardiovascular stress test 11/05/2004    NO EVIDENCE OF ISCHEMIA    Family History  Problem Relation Age of Onset  . Heart disease Mother   . Heart disease Brother   . Heart disease Brother     History  Substance Use Topics  . Smoking status: Former Smoker    Quit date: 12/16/1978  . Smokeless tobacco: Never Used  . Alcohol Use: No      Review of Systems 10 Systems reviewed and are negative for acute change except as noted in the HPI. Allergies  Ace inhibitors  Home Medications   No current outpatient prescriptions on file.  BP 131/70  Pulse 73  Temp 98.1 F (36.7 C) (Oral)  Resp 18  SpO2 94%  Physical Exam  Nursing note and vitals reviewed. Constitutional: He is oriented to person, place, and time.       Awake, alert, nontoxic appearance with baseline speech for patient.  HENT:  Head: Atraumatic.  Mouth/Throat: No oropharyngeal exudate.  Eyes: EOM are normal.  Pupils are equal, round, and reactive to light. Right eye exhibits no discharge. Left eye exhibits no discharge.  Neck: Neck supple.  Cardiovascular: Normal rate.   No murmur heard.      Irregular rhythm  Pulmonary/Chest: Effort normal and breath sounds normal. No stridor. No respiratory distress. He has no wheezes. He has no rales. He exhibits no tenderness.  Abdominal: Soft. Bowel sounds are normal. He exhibits no mass. There is no tenderness. There is no rebound.  Musculoskeletal: He exhibits no tenderness.       Baseline ROM, moves extremities with no obvious new focal weakness.  Lymphadenopathy:    He has no cervical adenopathy.  Neurological: He is alert and oriented to person, place,  and time.       Awake, alert, cooperative and aware of situation; motor strength 4/5 bilaterally; sensation normal to light touch bilaterally; peripheral visual fields full to confrontation; no facial asymmetry; tongue midline; major cranial nerves appear intact; no pronator drift, normal finger to nose bilaterally  Skin: No rash noted.  Psychiatric: He has a normal mood and affect.    ED Course  Procedures (including critical care time) Patient / Family / Caregiver understand and agree with initial ED impression and plan with expectations set for ED visit.When labs back will consider ACT/TelePsych consults.1545  No CM available, d/w SW states they can see tomorrow if Pt still in ED, ACT and Telepsych will see.1915  Pt stable in ED with no significant deterioration in condition.  Telepsych recs Pt can be discharged, but wife/daughter not comfortable with discharge fearing for wife's safety tonight, family requests keep in ED tonight so SW can see in AM, unsure if Pt will need placement or be discharged to home, family afraid Pt will become too angry and act out if discharged tonight.   Labs Reviewed  CBC WITH DIFFERENTIAL - Abnormal; Notable for the following:    Lymphocytes Relative 10 (*)     Lymphs Abs 0.6 (*)     Monocytes Relative 14 (*)     All other components within normal limits  COMPREHENSIVE METABOLIC PANEL - Abnormal; Notable for the following:    Potassium 5.5 (*)     Glucose, Bld 113 (*)     Alkaline Phosphatase 147 (*)     GFR calc non Af Amer 52 (*)     GFR calc Af Amer 60 (*)     All other components within normal limits  PROTIME-INR - Abnormal; Notable for the following:    Prothrombin Time 22.7 (*)     INR 2.10 (*)     All other components within normal limits  TSH - Abnormal; Notable for the following:    TSH 4.579 (*)     All other components within normal limits  URINE RAPID DRUG SCREEN (HOSP PERFORMED)  ETHANOL  URINALYSIS, ROUTINE W REFLEX MICROSCOPIC    PHENYTOIN LEVEL, TOTAL  POTASSIUM  LAB REPORT - SCANNED   No results found.   1. Psychosis       MDM   Pt stable in ED with no significant deterioration in condition. Patient / Family / Caregiver informed of clinical course, understand medical decision-making process, and agree with plan.         Hurman Horn, MD 01/10/13 [phone]

## 2013-01-04 LAB — TSH: TSH: 4.579 u[IU]/mL — ABNORMAL HIGH (ref 0.350–4.500)

## 2013-01-04 NOTE — ED Notes (Signed)
Patient is resting comfortably. 
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
## 2013-01-04 NOTE — Progress Notes (Signed)
[facility] ED CM received a call from Dr Waynard Edwards with pt's wife and daughter in his office.  CM updated Dr Waynard Edwards on pt's telepsych recommendations for no inpatient [facility] stay and community resources and Cm message left for wife to get the name of a home health agency to assist with placement.  Discussed options of private pay snf placement or home health to assist with placement.  Dr Waynard Edwards voiced understanding and discussed these options with wife and daughter. Dr Waynard Edwards requests a psychiatrist appointment be made for pt in next few days Dr Waynard Edwards voiced awareness that it takes weeks for a psychiatric appointment can be made CM and SW spoke with wife and daughter via speaker phone.  Wife states she is hard of hearing, has a heart condition and primary care giver for pt. Wife state that if pt comes home she will just lock herself in her room. Wife states prefers pt d/c to snf but states she can not afford private pay.  Daughter states prefers snf placement and do not know about private pay.  [facility] to use to provide HHRN/PT and SW as ordered by EDP.  Daughter states to use [facility] until Dr Waynard Edwards states otherwise. Cm discussed difference in home health and private duty services including cost and coverage for both with daughter.  CM spoke with ACT member about requested psychiatry appointment Dr Waynard Edwards requested and she will speak with wife at time of discharge

## 2013-01-04 NOTE — Progress Notes (Signed)
Received a return call from daughter, Nena Polio after she received the voice message left for wife.  Reminded her CM spoke with her, her mother and Dr Waynard Edwards earlier  Updated her that ACT member would like to speak with her about psychiatrist resources at d/c.  TCU RN updated.  Daughter states they will be to get pt at 4 pm 01/04/13.   Home health choices   HOME HEALTH AGENCIES SERVING [place]   Agencies that are Medicare-Certified and are affiliated with [facility] Health Agency  Telephone Number Address  [facility]   The [facility] has ownership interest in this company; however, you are under no obligation to use this agency. [phone] or  [phone] [address] [place], [place] [address] [email or web address]   Agencies that are Medicare-Certified and are not affiliated with [facility] Agency Telephone Number Address  [facility] [phone] Fax [phone] [address] [place], [place]  [address] [email or web address]  [facility] [phone] or [phone] Fax [phone] [address] [place], [place] [address] [email or web address]  [facility] [phone] Fax [phone] [address] [place], [place] [address] [email or web address]  [facility] [phone] Fax [phone] [address] [place], [place]  [address] [email or web address]  [facility] [phone] Fax [phone] [address] Suite [place], [place] [address] [email or web address]  [facility] [phone] [address] [place], [place] [address] [email or web address]  [facility] [phone]  [address] [place], [place] [address] [email or web address]  [facility] [phone] or [phone] Fax number [phone] [address] [place], [place]  [address] [email or web address]  [facility] [phone] Fax [phone] [address] [place], [place]  [address]  [facility]  [phone] Fax [phone] [address] [place], [place] [address] [email or web address]

## 2013-01-04 NOTE — Progress Notes (Signed)
Spoke with EDP, Jeraldine Loots, about home health orders pending wife final decision for name of home health agency

## 2013-01-04 NOTE — ED Notes (Signed)
Patient is awake, sitting up in bed, watching tv

## 2013-01-04 NOTE — ED Notes (Signed)
Attempted to ambulate pt a few steps with walker. Pt c/o dizziness with attempt to stand and unable to stand without assistance.
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
## 2013-01-04 NOTE — Progress Notes (Signed)
[facility] ED CM noted CM consult Updated by EDP, Jeraldine Loots, ED SW and ED RN States pt scheduled for d/c home on 01/04/13 after 4 pm to family. EPIC listing [facility] Aronson as pcp.  ED RN states she has not noted pt walking only using urinal at side of bed. Noted K 5.5 on on 01/03/13 pm with decrease to 4.7 on 01/04/13.  In 2012 EPIC notes indicated [facility]. CM left a voice message for wife at [phone] requesting return call to assist with confirming pcp and homebound status. Pending a return call from wife.  SW mentioned pt was d/c from snf Cm spoke with pt who is [facility] but is aware that he d/c from [facility] in 2012 near September and his pcp is Dr Waynard Edwards Pt reports having home health previously but can not recall the agency name Pt states he has 2 rolling walkers at home (one 2 wheeler and one with 4 wheels and a seat) Pt states he feels comfortable with discharging home with home services

## 2013-01-04 NOTE — Progress Notes (Signed)
Updated pcp to [employer] in [place] as confirmed by pt

## 2013-01-04 NOTE — ED Notes (Signed)
Pt is resting comfortably. A&O x3. No complications at this time.

## 2013-01-04 NOTE — Progress Notes (Signed)
Pt asked ED RN to have CM come to his room He inquired if the wife had call Cm informed she did call from Dr Perini's office and provided [facility] home care agency to use for services.  Pt requested to be assisted to call [phone] (his brother in law in [place]) to have him call his son, Chanetta Marshall to tell Chanetta Marshall to call Camille Bal ("financial business person") to "cancel the order" Cm assisted pt to dial the number but he stated "it rung but he did not pick up"  Pt started eating his lunch

## 2013-01-04 NOTE — Progress Notes (Signed)
[facility] ED Cm received a call from Tamala Bari of [facility] to refax the demographic sheet to her via [phone] Cm called mary at 1830 to confirm she received second faxed demographic sheet She reported first copy not obtained from [facility] central department Reports she has received all information needed and will place pt on [facility] scheduled for Wednesday

## 2013-01-04 NOTE — ED Notes (Signed)
Social worker and Wife in room

## 2013-01-04 NOTE — ED Provider Notes (Addendum)
8:05 AM Patient asleep.  Per RN, no acute events overnight. Patient w increasingly delusional behavior / thoughts. Placement pending, with SW assistance.  Gerhard Munch, MD 01/04/13 1610  3:16 PM I discussed the case w both SW, and CM.  The patient's family members are going to pick him up.  Gerhard Munch, MD 01/04/13 [phone]

## 2013-01-04 NOTE — ED Notes (Signed)
Patient resting comfortably in room 30.  Belongings placed in locker #30.  Joseph Bentley 12:23 AM 01/04/2013

## 2013-01-04 NOTE — Progress Notes (Signed)
Daughter was given a lists of [place] home health and private duty nursing information prior to living

## 2013-01-04 NOTE — BHH Counselor (Signed)
Per conversation with CM-Kim Noelle Penner, patients MD is requesting that a appointment is made for this patient prior to his discharge from the ED today. Writer made a appointment on patients behalf with Dr. Charlette Caffey at [facility] outpatient clinic. Patients spouse and daughter will be assisting patient with his appointment. Patients daughter and spouse were given the appointment time, date, etc. Patients appointment is 02/05/2013 at [facility]. Writer also provided patient with a additioinal standard list of referrals to psychiatrist and therapist in the community in case they wanted to search for another psychiatrist in the community. Patient was also informed and given information to mobile crises, support groups, etc.

## 2013-01-06 ENCOUNTER — Emergency Department (HOSPITAL_COMMUNITY)
Admission: EM | Admit: 2013-01-06 | Discharge: 2013-01-06 | Disposition: A | Discharge status: Home / Self Care | Payer: Medicare Other | Source: Home / Self Care | Attending: Emergency Medicine | Admitting: Emergency Medicine

## 2013-01-06 ENCOUNTER — Emergency Department (HOSPITAL_COMMUNITY): Payer: Medicare Other

## 2013-01-06 ENCOUNTER — Encounter (HOSPITAL_COMMUNITY): Payer: Self-pay | Admitting: Emergency Medicine

## 2013-01-06 DIAGNOSIS — I4891 Unspecified atrial fibrillation: Secondary | ICD-10-CM | POA: Insufficient documentation

## 2013-01-06 DIAGNOSIS — Z7901 Long term (current) use of anticoagulants: Secondary | ICD-10-CM | POA: Insufficient documentation

## 2013-01-06 DIAGNOSIS — Z87891 Personal history of nicotine dependence: Secondary | ICD-10-CM | POA: Insufficient documentation

## 2013-01-06 DIAGNOSIS — Z951 Presence of aortocoronary bypass graft: Secondary | ICD-10-CM | POA: Insufficient documentation

## 2013-01-06 DIAGNOSIS — Z862 Personal history of diseases of the blood and blood-forming organs and certain disorders involving the immune mechanism: Secondary | ICD-10-CM | POA: Insufficient documentation

## 2013-01-06 DIAGNOSIS — I2510 Atherosclerotic heart disease of native coronary artery without angina pectoris: Secondary | ICD-10-CM | POA: Insufficient documentation

## 2013-01-06 DIAGNOSIS — Z8639 Personal history of other endocrine, nutritional and metabolic disease: Secondary | ICD-10-CM | POA: Insufficient documentation

## 2013-01-06 DIAGNOSIS — Z9181 History of falling: Secondary | ICD-10-CM | POA: Insufficient documentation

## 2013-01-06 DIAGNOSIS — R531 Weakness: Secondary | ICD-10-CM

## 2013-01-06 DIAGNOSIS — E785 Hyperlipidemia, unspecified: Secondary | ICD-10-CM | POA: Insufficient documentation

## 2013-01-06 DIAGNOSIS — R2681 Unsteadiness on feet: Secondary | ICD-10-CM

## 2013-01-06 DIAGNOSIS — Z87448 Personal history of other diseases of urinary system: Secondary | ICD-10-CM | POA: Insufficient documentation

## 2013-01-06 DIAGNOSIS — I10 Essential (primary) hypertension: Secondary | ICD-10-CM | POA: Insufficient documentation

## 2013-01-06 DIAGNOSIS — Z79899 Other long term (current) drug therapy: Secondary | ICD-10-CM | POA: Insufficient documentation

## 2013-01-06 DIAGNOSIS — Z8673 Personal history of transient ischemic attack (TIA), and cerebral infarction without residual deficits: Secondary | ICD-10-CM | POA: Insufficient documentation

## 2013-01-06 DIAGNOSIS — G40909 Epilepsy, unspecified, not intractable, without status epilepticus: Secondary | ICD-10-CM | POA: Insufficient documentation

## 2013-01-06 DIAGNOSIS — R269 Unspecified abnormalities of gait and mobility: Secondary | ICD-10-CM | POA: Insufficient documentation

## 2013-01-06 DIAGNOSIS — R5381 Other malaise: Secondary | ICD-10-CM | POA: Insufficient documentation

## 2013-01-06 DIAGNOSIS — N400 Enlarged prostate without lower urinary tract symptoms: Secondary | ICD-10-CM | POA: Insufficient documentation

## 2013-01-06 DIAGNOSIS — Z9861 Coronary angioplasty status: Secondary | ICD-10-CM | POA: Insufficient documentation

## 2013-01-06 LAB — CBC WITH DIFFERENTIAL/PLATELET
Eosinophils Absolute: 0.1 10*3/uL (ref 0.0–0.7)
Eosinophils Relative: 2 % (ref 0–5)
Hemoglobin: 15.7 g/dL (ref 13.0–17.0)
Lymphs Abs: 0.6 10*3/uL — ABNORMAL LOW (ref 0.7–4.0)
MCH: 32.6 pg (ref 26.0–34.0)
MCV: 97.3 fL (ref 78.0–100.0)
Monocytes Absolute: 0.7 10*3/uL (ref 0.1–1.0)
Monocytes Relative: 9 % (ref 3–12)
RBC: 4.82 MIL/uL (ref 4.22–5.81)

## 2013-01-06 LAB — COMPREHENSIVE METABOLIC PANEL
Alkaline Phosphatase: 127 U/L — ABNORMAL HIGH (ref 39–117)
BUN: 40 mg/dL — ABNORMAL HIGH (ref 6–23)
Calcium: 8.6 mg/dL (ref 8.4–10.5)
GFR calc Af Amer: 48 mL/min — ABNORMAL LOW (ref >90)
Glucose, Bld: 114 mg/dL — ABNORMAL HIGH (ref 70–99)
Total Protein: 6.5 g/dL (ref 6.0–8.3)

## 2013-01-06 MED ORDER — SODIUM CHLORIDE 0.9 % IV BOLUS (SEPSIS)
1000.0000 mL | Freq: Once | INTRAVENOUS | Status: AC
  Administered 2013-01-06: 1000 mL via INTRAVENOUS

## 2013-01-06 NOTE — ED Notes (Signed)
IV team paged.  

## 2013-01-06 NOTE — ED Notes (Signed)
[facility] notified.  ?

## 2013-01-06 NOTE — ED Provider Notes (Signed)
History     CSN: [id number]  Arrival date & time 01/06/13  1059   First MD Initiated Contact with Patient 01/06/13 1130      Chief Complaint  Patient presents with  . Weakness    The history is provided by the patient, the spouse and medical records.   worsening gait stability over the past month.  He has been worked up for this without cause.  Family reports some change in his speech pattern over the past week.  He was recently seen in the emergency department and no medical reason was found and the patient was discharged home with home health resources as well as additional resources provided by case management.  The patient's wife states that today he fell which is becoming increasingly frequent phenomenon at which point he was too weak to be a "on his own.  The patient's wife call 911 for assistance and requests transportation emergency apartment for evaluation.  The patient's home health nurse was scheduled to show up at the house today to do her initial assessment.  No fevers or chills.  No vomiting or diarrhea.  Mild decreased oral intake today.  No recent change in medications.  The patient is without specific complaint.  The patient has had hematuria she is for approximately 4-6 weeks and has been able to her with a walker albeit unsteady per the patient's wife.  Past Medical History  Diagnosis Date  . Hypertension   . Epilepsy   . CAD (coronary artery disease)   . Hx of CABG   . Stroke   . B12 deficiency   . Low testosterone   . BPH (benign prostatic hyperplasia)   . A-fib   . Fall   . Seizures   . Hyperlipidemia   . Chronic anticoagulation     Past Surgical History  Procedure Date  . Back surgery 05/2011    cement fusion of broken vertebrae  . Cardiac surgery   . Hernia repair   . Coronary artery bypass graft   . Cardiac catheterization 02/11/2007    EF 55-60%  . Cardiac catheterization 01/04/2005    EF 55%  . Angioplasty 1987  . Hernia repair   . Pilonidal cyst  excision   . US echocardiography 01/18/2009    EF 55-60%  . US echocardiography 03/11/2003    EF 55-60%  . Cardiovascular stress test 11/05/2004    NO EVIDENCE OF ISCHEMIA    Family History  Problem Relation Age of Onset  . Heart disease Mother   . Heart disease Brother   . Heart disease Brother     History  Substance Use Topics  . Smoking status: Former Smoker    Quit date: 12/16/1978  . Smokeless tobacco: Never Used  . Alcohol Use: No      Review of Systems  Neurological: Positive for weakness.  All other systems reviewed and are negative.    Allergies  Ace inhibitors  Home Medications   Current Outpatient Rx  Name  Route  Sig  Dispense  Refill  . DOCUSATE SODIUM 100 MG PO CAPS   Oral   Take 100 mg by mouth at bedtime.           Marland Kitchen ESCITALOPRAM OXALATE 10 MG PO TABS   Oral   Take 10 mg by mouth daily.         Marland Kitchen HYDRALAZINE HCL 100 MG PO TABS   Oral   Take 100 mg by mouth 3 (three) times daily.         Marland Kitchen  LABETALOL HCL 200 MG PO TABS   Oral   Take 1 tablet (200 mg total) by mouth 3 (three) times daily.   180 tablet   0   . LEVETIRACETAM 500 MG PO TABS   Oral   Take 500 mg by mouth every 12 (twelve) hours.         Marland Kitchen PANTOPRAZOLE SODIUM 40 MG PO TBEC   Oral   Take 40 mg by mouth daily as needed. For acid reflux         . PHENYTOIN SODIUM EXTENDED 100 MG PO CAPS   Oral   Take 200 mg by mouth 2 (two) times daily.          Marland Kitchen RISPERIDONE 0.5 MG PO TABS   Oral   Take 0.5 mg by mouth at bedtime.         Marland Kitchen VALSARTAN 80 MG PO TABS   Oral   Take 80 mg by mouth every morning.          Marland Kitchen VITAMIN D (ERGOCALCIFEROL) 50000 UNITS PO CAPS   Oral   Take 50,000 Units by mouth every 7 (seven) days. On Tuesday         . WARFARIN SODIUM 5 MG PO TABS   Oral   Take 2.5-5 mg by mouth at bedtime. Take 2.5 mg on mondays, wednesdays and fridays. Take 5 mg on all other days         . TESTOSTERONE CYPIONATE 200 MG/ML IM OIL   Intramuscular    Inject 300 mg into the muscle every 28 (twenty-eight) days.         Marland Kitchen VITAMIN B1-B12 IJ   Injection   Inject 1,000 mcg as directed every 30 (thirty) days.             BP 122/58  Pulse 74  Temp 98 F (36.7 C) (Oral)  Resp 18  SpO2 98%  Physical Exam  Nursing note and vitals reviewed. Constitutional: He is oriented to person, place, and time. He appears well-developed and well-nourished.  HENT:  Head: Normocephalic and atraumatic.  Eyes: EOM are normal.  Neck: Normal range of motion.  Cardiovascular: Normal rate, regular rhythm, normal heart sounds and intact distal pulses.   Pulmonary/Chest: Effort normal and breath sounds normal. No respiratory distress.  Abdominal: Soft. He exhibits no distension. There is no tenderness.  Musculoskeletal: Normal range of motion.  Neurological: He is alert and oriented to person, place, and time.       Able to ambulate with assistance. Poor gait. With some instability  Skin: Skin is warm and dry.  Psychiatric: He has a normal mood and affect. Judgment normal.    ED Course  Procedures (including critical care time)  Labs Reviewed  CBC WITH DIFFERENTIAL - Abnormal; Notable for the following:    Neutrophils Relative 80 (*)     Lymphocytes Relative 8 (*)     Lymphs Abs 0.6 (*)     All other components within normal limits  COMPREHENSIVE METABOLIC PANEL - Abnormal; Notable for the following:    Sodium 134 (*)     Glucose, Bld 114 (*)     BUN 40 (*)     Creatinine, Ser 1.50 (*)     Alkaline Phosphatase 127 (*)     GFR calc non Af Amer 41 (*)     GFR calc Af Amer 48 (*)     All other components within normal limits  TROPONIN I   Dg Chest 2  View  01/06/2013  *RADIOLOGY REPORT*  Clinical Data: Weakness, hypertension.  CHEST - 2 VIEW  Comparison: 01/03/2013  Findings: Prior CABG.  Mild cardiomegaly.  Low lung volumes with vascular congestion and bibasilar atelectasis.  No effusions.  No acute bony abnormality.  IMPRESSION: Cardiomegaly  with vascular congestion, low lung volumes and bibasilar atelectasis.   Original Report Authenticated By: Charlett Nose, M.D.    I personally reviewed the imaging tests through PACS system I reviewed available ER/hospitalization records through the EMR   No diagnosis found.    MDM  No acute issues. Ongoing weakness and ambulation issues which has been long standing. Spoke with case management who was involved in his last ER visit. Home health assistance provided. [facility] to see if they can perform assesment at his house today. May benefit from wheelchair. Will likely require and aide as well. Will provide private duty nursing information as well. Scheduled for PT today.   Spoke with pts daughter at [phone] who understands the inability to admit today.  Private duty nursing information and out-of-pocket pay for skilled nursing facility broach.  The patient's daughter is agreeable to both and we'll work with her mother and father regarding this.  The patient does have renal insufficiency which looks slightly worse today.  This is likely dehydration.  The patient was given fluid in the emergency department he will need to followup with his primary care physician in 2 days for recheck of his BUN and creatinine.  4:28 PM Abnormal CT scan.  Patient is currently undergoing MRI scan to evaluate for the possibility of acute stroke.  If negative the patient will be discharged home.  I've artery written the patient for a wheelchair which home health services his brain to the emergency department.  The patient will require transportation home via ambulance.  Home health nursing and physical therapy her scheduled to see the patient home tomorrow.  I discussed this with the patient's wife and the patient's daughter were both agreeable to outpatient plan.  The patient's wife will contact 24-hour private duty nurses to provide assistance in the home today.  The patient's primary care physician  is well aware of these issues.  The patient will need followup in 2 days for recheck of his BUN and creatinine.  Please see case management notes from today.  Case management was involved.  I involved the patient, the patient's daughter, the patient's spouse.       Lyanne Co, MD 01/06/13 [phone]

## 2013-01-06 NOTE — Progress Notes (Signed)
[facility] ED CM consulted by EDP, Campos about pt home health services Pt was seen on 01/04/13 by ED CM and ED SW.  ED CM and ED SW spoke with wife, daughter and Dr Waynard Edwards via conference call about options of private pay snf and home health services to assist with placement. Prior to 01/04/13 d/c daughter provided with lists of home health agencies and private duty nursing services. Reviewed this with EDP.  Cm called [facility] to inquire if services cancelled by wife prior to her bring pt to ED today could be resumed/re-established Pending return call from Corrie Dandy [phone]) Wife agreed to d/c home 01/06/13 with added home aide and w/c CM offered family another list of private duty nursing services, snf list for [place] and home health list Hilda Lias at [facility] [id number] confirmed PT is still scheduled to see pt on Thursday 01/07/13 Family aware EDP and ED RN aware. CM completed referral to [facility] for w/c with Lucretia [phone]

## 2013-01-06 NOTE — Progress Notes (Signed)
[facility] ED CM consulted by ED charge RN. [facility] has brought w/c for pt but wife does not want the w/c for d/c home She is stating that the standard w/c ordered per pt wt of 160 lbs ht 5'5-5'7" is too wide to fit in their bedroom and bathroom [facility] coordinator states w/c width is 18 inches. CM assisted by encouraging wife to wait until HHPT can assist her on 01/07/13 with measurements of her doorways to her bathroom and bedroom at home so that Dr Waynard Edwards can write orders to get the correct sized wheelchair. CM faxed new home health orders to [facility] staff member Corrie Dandy [phone]) and updated her about need for PT to assist with w/c at home with Dr Perini's assistance at 1745 Pt scheduled for d/c home via [facility]. CM spoke with pt who stated he recall ed speaking with CM on 01/04/13 because Cm offered him a telephone to call his stepson.

## 2013-01-06 NOTE — ED Notes (Signed)
EMS reports patient has been experiencing weakness for several months with worsening symptoms today.  Patient's wife reports that she was unable to get him out of his chair this morning because of bilateral leg weakness.  No other associated symptoms reported.

## 2013-01-07 ENCOUNTER — Emergency Department (HOSPITAL_COMMUNITY): Payer: Medicare Other

## 2013-01-07 ENCOUNTER — Inpatient Hospital Stay (HOSPITAL_COMMUNITY)
Admission: EM | Admit: 2013-01-07 | Discharge: 2013-01-13 | DRG: 202 | Disposition: A | Discharge status: Skilled Nursing Facility | Payer: Medicare Other | Attending: Internal Medicine | Admitting: Internal Medicine

## 2013-01-07 ENCOUNTER — Encounter (HOSPITAL_COMMUNITY): Payer: Self-pay | Admitting: *Deleted

## 2013-01-07 ENCOUNTER — Other Ambulatory Visit: Payer: Self-pay

## 2013-01-07 DIAGNOSIS — Z9181 History of falling: Secondary | ICD-10-CM

## 2013-01-07 DIAGNOSIS — I10 Essential (primary) hypertension: Secondary | ICD-10-CM

## 2013-01-07 DIAGNOSIS — R5381 Other malaise: Secondary | ICD-10-CM | POA: Diagnosis present

## 2013-01-07 DIAGNOSIS — Z7901 Long term (current) use of anticoagulants: Secondary | ICD-10-CM

## 2013-01-07 DIAGNOSIS — R569 Unspecified convulsions: Secondary | ICD-10-CM

## 2013-01-07 DIAGNOSIS — G40909 Epilepsy, unspecified, not intractable, without status epilepticus: Secondary | ICD-10-CM | POA: Diagnosis present

## 2013-01-07 DIAGNOSIS — R55 Syncope and collapse: Secondary | ICD-10-CM

## 2013-01-07 DIAGNOSIS — R296 Repeated falls: Secondary | ICD-10-CM

## 2013-01-07 DIAGNOSIS — Z951 Presence of aortocoronary bypass graft: Secondary | ICD-10-CM

## 2013-01-07 DIAGNOSIS — I4891 Unspecified atrial fibrillation: Secondary | ICD-10-CM

## 2013-01-07 DIAGNOSIS — E860 Dehydration: Secondary | ICD-10-CM

## 2013-01-07 DIAGNOSIS — I2510 Atherosclerotic heart disease of native coronary artery without angina pectoris: Secondary | ICD-10-CM

## 2013-01-07 DIAGNOSIS — E871 Hypo-osmolality and hyponatremia: Secondary | ICD-10-CM

## 2013-01-07 DIAGNOSIS — J209 Acute bronchitis, unspecified: Secondary | ICD-10-CM

## 2013-01-07 HISTORY — DX: Repeated falls: R29.6

## 2013-01-07 LAB — PHENYTOIN LEVEL, TOTAL: Phenytoin Lvl: 19.9 ug/mL (ref 10.0–20.0)

## 2013-01-07 LAB — URINALYSIS, ROUTINE W REFLEX MICROSCOPIC
Bilirubin Urine: NEGATIVE
Nitrite: NEGATIVE
pH: 5 (ref 5.0–8.0)

## 2013-01-07 LAB — BASIC METABOLIC PANEL
CO2: 23 mEq/L (ref 19–32)
Calcium: 8.5 mg/dL (ref 8.4–10.5)
GFR calc non Af Amer: 65 mL/min — ABNORMAL LOW (ref >90)
Sodium: 133 mEq/L — ABNORMAL LOW (ref 135–145)

## 2013-01-07 LAB — URINE MICROSCOPIC-ADD ON

## 2013-01-07 LAB — CBC WITH DIFFERENTIAL/PLATELET
Lymphocytes Relative: 7 % — ABNORMAL LOW (ref 12–46)
Lymphs Abs: 0.5 10*3/uL — ABNORMAL LOW (ref 0.7–4.0)
Neutrophils Relative %: 80 % — ABNORMAL HIGH (ref 43–77)
Platelets: 135 10*3/uL — ABNORMAL LOW (ref 150–400)
RBC: 4.72 MIL/uL (ref 4.22–5.81)
WBC: 7.4 10*3/uL (ref 4.0–10.5)

## 2013-01-07 LAB — PROTIME-INR: Prothrombin Time: 17.4 seconds — ABNORMAL HIGH (ref 11.6–15.2)

## 2013-01-07 LAB — TROPONIN I: Troponin I: <0.3 ng/mL (ref <0.30)

## 2013-01-07 MED ORDER — CYANOCOBALAMIN 1000 MCG/ML IJ SOLN
1000.0000 ug | INTRAMUSCULAR | Status: DC
  Administered 2013-01-07: 1000 ug via INTRAMUSCULAR
  Filled 2013-01-07: qty 1

## 2013-01-07 MED ORDER — LEVETIRACETAM 500 MG PO TABS
500.0000 mg | ORAL_TABLET | Freq: Two times a day (BID) | ORAL | Status: DC
  Administered 2013-01-07 – 2013-01-13 (×12): 500 mg via ORAL
  Filled 2013-01-07 (×13): qty 1

## 2013-01-07 MED ORDER — HEPARIN SODIUM (PORCINE) 5000 UNIT/ML IJ SOLN
5000.0000 [IU] | Freq: Three times a day (TID) | INTRAMUSCULAR | Status: DC
  Administered 2013-01-07 – 2013-01-13 (×17): 5000 [IU] via SUBCUTANEOUS
  Filled 2013-01-07 (×20): qty 1

## 2013-01-07 MED ORDER — SODIUM CHLORIDE 0.9 % IV SOLN
INTRAVENOUS | Status: DC
  Administered 2013-01-07: 18:00:00 via INTRAVENOUS
  Administered 2013-01-08: 75 mL/h via INTRAVENOUS

## 2013-01-07 MED ORDER — RISPERIDONE 0.5 MG PO TABS
0.5000 mg | ORAL_TABLET | Freq: Every day | ORAL | Status: DC
  Administered 2013-01-07 – 2013-01-12 (×6): 0.5 mg via ORAL
  Filled 2013-01-07 (×7): qty 1

## 2013-01-07 MED ORDER — PANTOPRAZOLE SODIUM 40 MG PO TBEC: 40.0000 mg | DELAYED_RELEASE_TABLET | Freq: Every day | ORAL | Status: DC | PRN

## 2013-01-07 MED ORDER — DOCUSATE SODIUM 100 MG PO CAPS
100.0000 mg | ORAL_CAPSULE | Freq: Every day | ORAL | Status: DC
  Administered 2013-01-07 – 2013-01-12 (×6): 100 mg via ORAL
  Filled 2013-01-07 (×7): qty 1

## 2013-01-07 MED ORDER — HYDRALAZINE HCL 100 MG PO TABS
100.0000 mg | ORAL_TABLET | Freq: Three times a day (TID) | ORAL | Status: DC
  Administered 2013-01-07 – 2013-01-10 (×8): 100 mg via ORAL
  Administered 2013-01-10: 50 mg via ORAL
  Administered 2013-01-10 – 2013-01-13 (×8): 100 mg via ORAL
  Filled 2013-01-07 (×19): qty 1

## 2013-01-07 MED ORDER — SODIUM CHLORIDE 0.9 % IV SOLN: INTRAVENOUS | Status: AC

## 2013-01-07 MED ORDER — PHENYTOIN SODIUM EXTENDED 100 MG PO CAPS
200.0000 mg | ORAL_CAPSULE | Freq: Two times a day (BID) | ORAL | Status: DC
  Administered 2013-01-07 – 2013-01-13 (×12): 200 mg via ORAL
  Filled 2013-01-07 (×13): qty 2

## 2013-01-07 MED ORDER — ESCITALOPRAM OXALATE 10 MG PO TABS
10.0000 mg | ORAL_TABLET | Freq: Every day | ORAL | Status: DC
  Administered 2013-01-07 – 2013-01-13 (×7): 10 mg via ORAL
  Filled 2013-01-07 (×6): qty 1

## 2013-01-07 MED ORDER — ONDANSETRON HCL 4 MG/2ML IJ SOLN: 4.0000 mg | Freq: Three times a day (TID) | INTRAMUSCULAR | Status: AC | PRN

## 2013-01-07 MED ORDER — VITAMIN D (ERGOCALCIFEROL) 1.25 MG (50000 UNIT) PO CAPS
50000.0000 [IU] | ORAL_CAPSULE | ORAL | Status: DC
  Administered 2013-01-12: 50000 [IU] via ORAL
  Filled 2013-01-07: qty 1

## 2013-01-07 MED ORDER — LABETALOL HCL 200 MG PO TABS
200.0000 mg | ORAL_TABLET | Freq: Three times a day (TID) | ORAL | Status: DC
  Administered 2013-01-07 – 2013-01-13 (×17): 200 mg via ORAL
  Filled 2013-01-07 (×19): qty 1

## 2013-01-07 MED ORDER — SODIUM CHLORIDE 0.9 % IJ SOLN
3.0000 mL | Freq: Two times a day (BID) | INTRAMUSCULAR | Status: DC
  Administered 2013-01-09 – 2013-01-13 (×6): 3 mL via INTRAVENOUS

## 2013-01-07 MED ORDER — ASPIRIN EC 325 MG PO TBEC
325.0000 mg | DELAYED_RELEASE_TABLET | Freq: Every day | ORAL | Status: DC
  Administered 2013-01-07 – 2013-01-13 (×7): 325 mg via ORAL
  Filled 2013-01-07 (×7): qty 1

## 2013-01-07 MED ORDER — IRBESARTAN 75 MG PO TABS
75.0000 mg | ORAL_TABLET | Freq: Every day | ORAL | Status: DC
  Administered 2013-01-07 – 2013-01-13 (×7): 75 mg via ORAL
  Filled 2013-01-07 (×7): qty 1

## 2013-01-07 NOTE — ED Notes (Signed)
Per EMS report EMS called to home 3 times today, for fall and help to transport pt to car so family can take him to PCP. 3rd time EMS was called due to pt passing out on his way to pcp. Upon their arrival EMS reports pt was A+O x4 however appeared lethargic but  arousable. EMS reports pt is getting more lethargic en route.

## 2013-01-07 NOTE — ED Provider Notes (Signed)
History     CSN: [id number]  Arrival date & time 01/07/13  1620   First MD Initiated Contact with Patient 01/07/13 1631      Chief Complaint  Patient presents with  . Altered Mental Status  . Loss of Consciousness    The history is provided by a relative, the spouse and the EMS personnel. The history is limited by the condition of the patient (Hx dementia).  Pt was seen at 1645.  Per EMS and spouse report, c/o pt with gradual onset and worsening of persistent generalized weakness, lethargy, and frequent falling for the past several days.  Pt's wife states pt "passed out" while on the way to his PMD's office today.  Pt has been eval in the ED several times in the past week for same, most recently yesterday.  Denies fevers, no SOB/cough, no N/V/D.     Past Medical History  Diagnosis Date  . Hypertension   . Epilepsy   . CAD (coronary artery disease)   . Hx of CABG   . Stroke   . B12 deficiency   . Low testosterone   . BPH (benign prostatic hyperplasia)   . A-fib   . Fall   . Seizures   . Hyperlipidemia   . Chronic anticoagulation     Past Surgical History  Procedure Date  . Back surgery 05/2011    cement fusion of broken vertebrae  . Cardiac surgery   . Hernia repair   . Coronary artery bypass graft   . Cardiac catheterization 02/11/2007    EF 55-60%  . Cardiac catheterization 01/04/2005    EF 55%  . Angioplasty 1987  . Hernia repair   . Pilonidal cyst excision   . US echocardiography 01/18/2009    EF 55-60%  . US echocardiography 03/11/2003    EF 55-60%  . Cardiovascular stress test 11/05/2004    NO EVIDENCE OF ISCHEMIA    Family History  Problem Relation Age of Onset  . Heart disease Mother   . Heart disease Brother   . Heart disease Brother     History  Substance Use Topics  . Smoking status: Former Smoker    Quit date: 12/16/1978  . Smokeless tobacco: Never Used  . Alcohol Use: No      Review of Systems  Unable to perform ROS: Dementia     Allergies  Ace inhibitors  Home Medications   Current Outpatient Rx  Name  Route  Sig  Dispense  Refill  . CYANOCOBALAMIN 1000 MCG/ML IJ SOLN   Intramuscular   Inject 1,000 mcg into the muscle every 30 (thirty) days.         . DOCUSATE SODIUM 100 MG PO CAPS   Oral   Take 100 mg by mouth at bedtime.           Marland Kitchen ESCITALOPRAM OXALATE 10 MG PO TABS   Oral   Take 10 mg by mouth daily.         Marland Kitchen HYDRALAZINE HCL 100 MG PO TABS   Oral   Take 100 mg by mouth 3 (three) times daily.         Marland Kitchen LABETALOL HCL 200 MG PO TABS   Oral   Take 1 tablet (200 mg total) by mouth 3 (three) times daily.   180 tablet   0   . LEVETIRACETAM 500 MG PO TABS   Oral   Take 500 mg by mouth every 12 (twelve) hours.         Marland Kitchen  PANTOPRAZOLE SODIUM 40 MG PO TBEC   Oral   Take 40 mg by mouth daily as needed. For acid reflux         . PHENYTOIN SODIUM EXTENDED 100 MG PO CAPS   Oral   Take 200 mg by mouth 2 (two) times daily.          Marland Kitchen RISPERIDONE 0.5 MG PO TABS   Oral   Take 0.5 mg by mouth at bedtime.         . TESTOSTERONE CYPIONATE 200 MG/ML IM OIL   Intramuscular   Inject 300 mg into the muscle every 28 (twenty-eight) days.         Marland Kitchen VALSARTAN 80 MG PO TABS   Oral   Take 80 mg by mouth every morning.          Marland Kitchen VITAMIN D (ERGOCALCIFEROL) 50000 UNITS PO CAPS   Oral   Take 50,000 Units by mouth every 7 (seven) days. On Tuesday         . WARFARIN SODIUM 5 MG PO TABS   Oral   Take 2.5-5 mg by mouth at bedtime. Take 2.5 mg on mondays, wednesdays and fridays. Take 5 mg on all other days           BP 193/89  Pulse 88  Temp 99.6 F (37.6 C) (Oral)  Resp 18  SpO2 93%  Physical Exam 1650: Physical examination:  Nursing notes reviewed; Vital signs and O2 SAT reviewed;  Constitutional: Well developed, Well nourished, In no acute distress; Head:  Normocephalic, atraumatic; Eyes: EOMI, PERRL, No scleral icterus; ENMT: Mouth and pharynx normal, Mucous membranes  dry; Neck: Supple, Full range of motion, No lymphadenopathy; Cardiovascular: Irregular irregular rate and rhythm, No gallop; Respiratory: Breath sounds clear & equal bilaterally, No rales, rhonchi, wheezes.  Speaking full sentences with ease, Normal respiratory effort/excursion; Chest: Nontender, Movement normal; Abdomen: Soft, Nontender, Nondistended, Normal bowel sounds;; Extremities: Pulses normal, No tenderness, No edema, No calf edema or asymmetry.; Neuro: Awake, alert, confused per hx dementia. Major CN grossly intact. No facial droop. Speech clear. Moves all ext well spontaneously and to command without apparent gross focal motor deficits, no drift x4.; Skin: Color normal, Warm, Dry.   ED Course  Procedures     MDM  MDM Reviewed: previous chart, nursing note and vitals Reviewed previous: MRI, labs and ECG Interpretation: CT scan, labs, ECG and x-ray   Mr Brain Wo Contrast 01/06/2013  *RADIOLOGY REPORT*  Clinical Data: 79 year old male with increasing weakness, unsteady gait, and speech changes times 1 week.  Fall.  MRI HEAD WITHOUT CONTRAST  Technique:  Multiplanar, multiecho pulse sequences of the brain and surrounding structures were obtained according to standard protocol without intravenous contrast.  Comparison: Head CT 01/06/2013.  Brain MRI 12/09/2011.  Findings: Mildly heterogeneous diffusion signal, but No restricted diffusion to suggest acute infarction.  Stable cerebral volume. No midline shift, ventriculomegaly, mass effect, evidence of mass lesion, extra-axial collection or acute intracranial hemorrhage.  Cervicomedullary junction and pituitary are within normal limits.  Major intracranial vascular flow voids are stable.  Patchy cerebral white matter and pontine T2 and FLAIR hyperintensity has mildly progressed since 2012.  No areas of cortical encephalomalacia are identified.  Occasional chronic micro hemorrhages in the brain, including the left thalamus, are stable. Tiny chronic  lacunar infarct in the left cerebellar tonsil may be new.  Stable visualized cervical spine.  Normal bone marrow signal. Interval postoperative changes to the orbits.  Stable paranasal sinuses with  mild mucosal thickening.  Mastoids are clear. Negative scalp soft tissues.  IMPRESSION: 1. No acute intracranial abnormality. 2.  Signal changes compatible with chronic small vessel disease, mildly progressed since 2012.   Original Report Authenticated By: Erskine Speed, M.D.       Date: 01/07/2013  Rate: 83  Rhythm: atrial fibrillation  QRS Axis: normal  Intervals: normal  ST/T Wave abnormalities: normal  Conduction Disutrbances:none  Narrative Interpretation:   Old EKG Reviewed: unchanged; no significant changes from previous EKG dated 12/08/2011.   Results for orders placed during the hospital encounter of 01/07/13  URINALYSIS, ROUTINE W REFLEX MICROSCOPIC      Component Value Range   Color, Urine YELLOW  YELLOW   APPearance CLOUDY (*) CLEAR   Specific Gravity, Urine 1.028  1.005 - 1.030   pH 5.0  5.0 - 8.0   Glucose, UA NEGATIVE  NEGATIVE mg/dL   Hgb urine dipstick TRACE (*) NEGATIVE   Bilirubin Urine NEGATIVE  NEGATIVE   Ketones, ur TRACE (*) NEGATIVE mg/dL   Protein, ur 253 (*) NEGATIVE mg/dL   Urobilinogen, UA 1.0  0.0 - 1.0 mg/dL   Nitrite NEGATIVE  NEGATIVE   Leukocytes, UA NEGATIVE  NEGATIVE  BASIC METABOLIC PANEL      Component Value Range   Sodium 133 (*) 135 - 145 mEq/L   Potassium 4.6  3.5 - 5.1 mEq/L   Chloride 98  96 - 112 mEq/L   CO2 23  19 - 32 mEq/L   Glucose, Bld 129 (*) 70 - 99 mg/dL   BUN 28 (*) 6 - 23 mg/dL   Creatinine, Ser 6.64  0.50 - 1.35 mg/dL   Calcium 8.5  8.4 - 40.3 mg/dL   GFR calc non Af Amer 65 (*) >90 mL/min   GFR calc Af Amer 75 (*) >90 mL/min  CBC WITH DIFFERENTIAL      Component Value Range   WBC 7.4  4.0 - 10.5 K/uL   RBC 4.72  4.22 - 5.81 MIL/uL   Hemoglobin 15.7  13.0 - 17.0 g/dL   HCT 47.4  25.9 - 56.3 %   MCV 96.2  78.0 - 100.0 fL    MCH 33.3  26.0 - 34.0 pg   MCHC 34.6  30.0 - 36.0 g/dL   RDW 87.5  64.3 - 32.9 %   Platelets 135 (*) 150 - 400 K/uL   Neutrophils Relative 80 (*) 43 - 77 %   Neutro Abs 5.9  1.7 - 7.7 K/uL   Lymphocytes Relative 7 (*) 12 - 46 %   Lymphs Abs 0.5 (*) 0.7 - 4.0 K/uL   Monocytes Relative 13 (*) 3 - 12 %   Monocytes Absolute 0.9  0.1 - 1.0 K/uL   Eosinophils Relative 0  0 - 5 %   Eosinophils Absolute 0.0  0.0 - 0.7 K/uL   Basophils Relative 0  0 - 1 %   Basophils Absolute 0.0  0.0 - 0.1 K/uL  TROPONIN I      Component Value Range   Troponin I <0.30  <0.30 ng/mL  PROTIME-INR      Component Value Range   Prothrombin Time 17.4 (*) 11.6 - 15.2 seconds   INR 1.47  0.00 - 1.49  PHENYTOIN LEVEL, TOTAL      Component Value Range   Phenytoin Lvl 19.9  10.0 - 20.0 ug/mL  URINE MICROSCOPIC-ADD ON      Component Value Range   Squamous Epithelial / LPF RARE  RARE  Bacteria, UA FEW (*) RARE   Casts GRANULAR CAST (*) NEGATIVE   Urine-Other MUCOUS PRESENT     Dg Chest 2 View 01/07/2013  *RADIOLOGY REPORT*  Clinical Data: Cough, weakness.  CHEST - 2 VIEW  Comparison: 01/06/2013  Findings: Mild cardiomegaly.  Prior CABG.  Bibasilar opacities, likely atelectasis.  No effusions.  No acute bony abnormality.  No real change.  IMPRESSION: Cardiomegaly, low volumes with bibasilar atelectasis.  No real change.   Original Report Authenticated By: Charlett Nose, M.D.    Ct Head Wo Contrast 01/07/2013  *RADIOLOGY REPORT*  Clinical Data: Weakness.  CT HEAD WITHOUT CONTRAST  Technique:  Contiguous axial images were obtained from the base of the skull through the vertex without contrast.  Comparison: 01/06/2013  Findings: There is atrophy and chronic small vessel disease changes. No acute intracranial abnormality.  Specifically, no hemorrhage, hydrocephalus, mass lesion, acute infarction, or significant intracranial injury.  No acute calvarial abnormality. The mastoids are clear.  Mucosal thickening in the ethmoid air  cells.  IMPRESSION: No acute intracranial abnormality.  Atrophy, chronic microvascular disease.   Original Report Authenticated By: Charlett Nose, M.D.      1943:  Dx and testing d/w pt's family.  Questions answered.  Verb understanding, agreeable to admit. T/C to [facility] Dr. Julian Reil, case discussed, including:  HPI, pertinent PM/SHx, VS/PE, dx testing, ED course and treatment:  Agreeable to observation admit, requests to write temporary orders, obtain tele bed.          Laray Anger, DO 01/08/13 [phone]

## 2013-01-07 NOTE — Progress Notes (Signed)
Pt's daughter, Joseph Bentley and CM spoke again to re-review the private duty nursing services sheet.  Daughter states the list she received on 01/04/13 is in her car and she did not call any agencies because her mother wanted to care for pt in the home and the wife stated the cost was too much to private pay to snf.  CM provided another private duty service list. Cm discussed reviewing private duty sheet, snf and home health sheets with the wife on 01/06/13.  Joseph Bentley reports that the wife, Joseph Bentley, has a hx of a stroke and "does not understand some things" Daughter reports wife not able to care for pt as evidence by her unable to get him up when he fell today and unable to assist with pt's bathroom needs today. Daughter confirms [facility] came about 2:30 pm today to the home after wife had called EMS once when pt fell. The home health Rn was there when EMS was called again (initially to take pt to pcp office as advised by the [facility] RN but EMS informed family they can not transport to pcp office) and pt assisted to daughter's car by EMS to take him to Dr Waynard Edwards office.  Daughter reports pt stated he did not feel good in commute to [employer] office near [facility]  EMS was called at that time to transport pt [facility] ED.  CM reviewed with Joseph Bentley and Joseph Bentley that there has been a request for a consulting MD to evaluate pt to see if pt needs to be admitted. Cm reviewed with Joseph Bentley and Joseph Bentley the differences between observation and inpatient admissions as related to facility placement.  CM recommended Joseph Bentley continue to make attempts to call private duty nursing services in case pt is admitted as observation and may need d/c home when Joseph Bentley stated that if the pt is to be d/c home on 01/07/13 or 01/08/13 the pt will need 24 hr nursing services. CM discussed that private duty nursing is out of pocket expense and Joseph Bentley stated she understood CM conversed with the pt who told Cm his [birth year] would be on 01/08/2011 (which is correct as  listed in EPIC) CM wished him a happy pre birthday.

## 2013-01-07 NOTE — ED Notes (Signed)
GEX:[id number]<XL> Expected date:<BR> Expected time:<BR> Means of arrival:<BR> Comments:<BR> 79yo- ALOC

## 2013-01-07 NOTE — H&P (Signed)
[facility] History and Physical  YOLTZIN BARG ZOX:[id number] DOB: 16-Sep-1926 DOA: 01/07/2013  Referring physician: ED PCP: Ezequiel Kayser, MD  Specialists: None  Chief Complaint: Recurrent falls, social admit  HPI: Joseph Bentley is a 79 y.o. male who unfortunately has had numerous falls over the past weeks and months.  Unfortunately due to chronic weakness it has gotten to the point where he has had 3-4 falls in the past week alone.  Worse his wife is not able to physically get him up after the falls.  Thankfully he has not injured himself in the past few falls (although he has broken his arm in the past).  Despite multiple ER visits no medical cause for his progressive generalized weakness has been able to be found.  Hospitalist has been asked to admit while the social situation is resolved.  Review of Systems: 12 systems reviewed and otherwise negative.  Past Medical History  Diagnosis Date  . Hypertension   . Epilepsy   . CAD (coronary artery disease)   . Hx of CABG   . Stroke   . B12 deficiency   . Low testosterone   . BPH (benign prostatic hyperplasia)   . A-fib   . Fall   . Seizures   . Hyperlipidemia   . Chronic anticoagulation    Past Surgical History  Procedure Date  . Back surgery 05/2011    cement fusion of broken vertebrae  . Cardiac surgery   . Hernia repair   . Coronary artery bypass graft   . Cardiac catheterization 02/11/2007    EF 55-60%  . Cardiac catheterization 01/04/2005    EF 55%  . Angioplasty 1987  . Hernia repair   . Pilonidal cyst excision   . US echocardiography 01/18/2009    EF 55-60%  . US echocardiography 03/11/2003    EF 55-60%  . Cardiovascular stress test 11/05/2004    NO EVIDENCE OF ISCHEMIA   Social History:  reports that he quit smoking about 34 years ago. He has never used smokeless tobacco. He reports that he does not drink alcohol or use illicit drugs.   Allergies  Allergen Reactions  . Ace Inhibitors     unknown     Family History  Problem Relation Age of Onset  . Heart disease Mother   . Heart disease Brother   . Heart disease Brother    Prior to Admission medications   Medication Sig Start Date End Date Taking? Authorizing Provider  cyanocobalamin (,VITAMIN B-12,) 1000 MCG/ML injection Inject 1,000 mcg into the muscle every 30 (thirty) days.   Yes Historical Provider, MD  docusate sodium (COLACE) 100 MG capsule Take 100 mg by mouth at bedtime.     Yes Historical Provider, MD  escitalopram (LEXAPRO) 10 MG tablet Take 10 mg by mouth daily.   Yes Historical Provider, MD  hydrALAZINE (APRESOLINE) 100 MG tablet Take 100 mg by mouth 3 (three) times daily. 01/14/12 01/13/13 Yes Peter M [place], MD  labetalol (NORMODYNE) 200 MG tablet Take 1 tablet (200 mg total) by mouth 3 (three) times daily. 12/07/12 12/07/13 Yes Peter M [place], MD  levETIRAcetam (KEPPRA) 500 MG tablet Take 500 mg by mouth every 12 (twelve) hours.   Yes Historical Provider, MD  pantoprazole (PROTONIX) 40 MG tablet Take 40 mg by mouth daily as needed. For acid reflux   Yes Historical Provider, MD  phenytoin (DILANTIN) 100 MG ER capsule Take 200 mg by mouth 2 (two) times daily.    Yes Historical  Provider, MD  risperiDONE (RISPERDAL) 0.5 MG tablet Take 0.5 mg by mouth at bedtime.   Yes Historical Provider, MD  testosterone cypionate (DEPOTESTOTERONE CYPIONATE) 200 MG/ML injection Inject 300 mg into the muscle every 28 (twenty-eight) days.   Yes Historical Provider, MD  valsartan (DIOVAN) 80 MG tablet Take 80 mg by mouth every morning.    Yes Historical Provider, MD  Vitamin D, Ergocalciferol, (DRISDOL) 50000 UNITS CAPS Take 50,000 Units by mouth every 7 (seven) days. On Tuesday   Yes Historical Provider, MD  warfarin (COUMADIN) 5 MG tablet Take 2.5-5 mg by mouth at bedtime. Take 2.5 mg on mondays, wednesdays and fridays. Take 5 mg on all other days   Yes Historical Provider, MD   Physical Exam: Filed Vitals:   01/07/13 1644 01/07/13 1939  01/07/13 1942 01/07/13 1943  BP:  179/84 169/78 194/70  Pulse: 88 87 96 96  Temp:      TempSrc:      Resp:      SpO2:        General:  NAD, resting comfortably in bed Eyes: PEERLA EOMI ENT: mucous membranes moist Neck: supple w/o JVD Cardiovascular: RRR w/o MRG Respiratory: CTA B Abdomen: soft, nt, nd, bs+ Skin: no rash nor lesion Musculoskeletal: MAE, full ROM all 4 extremities Psychiatric: normal tone and affect Neurologic: AAOx3, grossly non-focal, some mild dementia but patient actually seems to have fairly decent mental acuity  Labs on Admission:  Basic Metabolic Panel:  Lab 01/07/13 4540 01/06/13 1200 01/04/13 0230 01/03/13 1523  NA 133* 134* -- 135  K 4.6 4.7 4.7 5.5*  CL 98 97 -- 99  CO2 23 20 -- 25  GLUCOSE 129* 114* -- 113*  BUN 28* 40* -- 22  CREATININE 1.03 1.50* -- 1.24  CALCIUM 8.5 8.6 -- 8.9  MG -- -- -- --  PHOS -- -- -- --   Liver Function Tests:  Lab 01/06/13 1200 01/03/13 1523  AST 23 17  ALT 11 8  ALKPHOS 127* 147*  BILITOT 0.5 0.4  PROT 6.5 6.9  ALBUMIN 3.8 3.8   No results found for this basename: LIPASE:5,AMYLASE:5 in the last 168 hours No results found for this basename: AMMONIA:5 in the last 168 hours CBC:  Lab 01/07/13 1648 01/06/13 1200 01/03/13 1523  WBC 7.4 7.2 5.9  NEUTROABS 5.9 5.8 4.2  HGB 15.7 15.7 16.4  HCT 45.4 46.9 48.9  MCV 96.2 97.3 96.4  PLT 135* 161 190   Cardiac Enzymes:  Lab 01/07/13 1648 01/06/13 1200  CKTOTAL -- --  CKMB -- --  CKMBINDEX -- --  TROPONINI <0.30 <0.30    BNP (last 3 results) No results found for this basename: PROBNP:3 in the last 8760 hours CBG: No results found for this basename: GLUCAP:5 in the last 168 hours  Radiological Exams on Admission: Dg Chest 2 View  01/07/2013  *RADIOLOGY REPORT*  Clinical Data: Cough, weakness.  CHEST - 2 VIEW  Comparison: 01/06/2013  Findings: Mild cardiomegaly.  Prior CABG.  Bibasilar opacities, likely atelectasis.  No effusions.  No acute bony  abnormality.  No real change.  IMPRESSION: Cardiomegaly, low volumes with bibasilar atelectasis.  No real change.   Original Report Authenticated By: Charlett Nose, M.D.    Dg Chest 2 View  01/06/2013  *RADIOLOGY REPORT*  Clinical Data: Weakness, hypertension.  CHEST - 2 VIEW  Comparison: 01/03/2013  Findings: Prior CABG.  Mild cardiomegaly.  Low lung volumes with vascular congestion and bibasilar atelectasis.  No effusions.  No  acute bony abnormality.  IMPRESSION: Cardiomegaly with vascular congestion, low lung volumes and bibasilar atelectasis.   Original Report Authenticated By: Charlett Nose, M.D.    Ct Head Wo Contrast  01/07/2013  *RADIOLOGY REPORT*  Clinical Data: Weakness.  CT HEAD WITHOUT CONTRAST  Technique:  Contiguous axial images were obtained from the base of the skull through the vertex without contrast.  Comparison: 01/06/2013  Findings: There is atrophy and chronic small vessel disease changes. No acute intracranial abnormality.  Specifically, no hemorrhage, hydrocephalus, mass lesion, acute infarction, or significant intracranial injury.  No acute calvarial abnormality. The mastoids are clear.  Mucosal thickening in the ethmoid air cells.  IMPRESSION: No acute intracranial abnormality.  Atrophy, chronic microvascular disease.   Original Report Authenticated By: Charlett Nose, M.D.    Ct Head Wo Contrast  01/06/2013  *RADIOLOGY REPORT*  Clinical Data: Weakness  CT HEAD WITHOUT CONTRAST  Technique:  Contiguous axial images were obtained from the base of the skull through the vertex without contrast.  Comparison: CT 01/03/2013, MRI 12/09/2011  Findings: Hypodensity left pons and midbrain are more prominent than on the prior study.  This could be acute or chronic ischemia. Correlate with clinical findings and consider follow-up MRI.  Generalized atrophy.  Negative for hemorrhage or mass.  Mild chronic microvascular ischemia in the white matter.  No midline shift.  Calvarium is intact.  IMPRESSION:  Negative for hemorrhage or mass.  Hypodensity left mid brain and pons may represent acute or chronic ischemia.  Consider follow-up MRI if this correlates with the patient's weakness.   Original Report Authenticated By: Janeece Riggers, M.D.    Mr Brain Wo Contrast  01/06/2013  *RADIOLOGY REPORT*  Clinical Data: 79 year old male with increasing weakness, unsteady gait, and speech changes times 1 week.  Fall.  MRI HEAD WITHOUT CONTRAST  Technique:  Multiplanar, multiecho pulse sequences of the brain and surrounding structures were obtained according to standard protocol without intravenous contrast.  Comparison: Head CT 01/06/2013.  Brain MRI 12/09/2011.  Findings: Mildly heterogeneous diffusion signal, but No restricted diffusion to suggest acute infarction.  Stable cerebral volume. No midline shift, ventriculomegaly, mass effect, evidence of mass lesion, extra-axial collection or acute intracranial hemorrhage.  Cervicomedullary junction and pituitary are within normal limits.  Major intracranial vascular flow voids are stable.  Patchy cerebral white matter and pontine T2 and FLAIR hyperintensity has mildly progressed since 2012.  No areas of cortical encephalomalacia are identified.  Occasional chronic micro hemorrhages in the brain, including the left thalamus, are stable. Tiny chronic lacunar infarct in the left cerebellar tonsil may be new.  Stable visualized cervical spine.  Normal bone marrow signal. Interval postoperative changes to the orbits.  Stable paranasal sinuses with mild mucosal thickening.  Mastoids are clear. Negative scalp soft tissues.  IMPRESSION: 1. No acute intracranial abnormality. 2.  Signal changes compatible with chronic small vessel disease, mildly progressed since 2012.   Original Report Authenticated By: Erskine Speed, M.D.     EKG: Independently reviewed.  Assessment/Plan Principal Problem:  *Recurrent falls Active Problems:  Seizure  HTN (hypertension)  Atrial fibrillation   Anticoagulant long-term use   1. Recurrent Falls - Admitting patient to hospital for obs while social situation resolved, will go ahead and put on tele monitor since theres some question of syncope but it dosent really sound syncopal from what the family is describing, more like a generalized weakness.  Checking testosterone level and B12 since he has had deficiencies of these in the past. 2. H/o  seizure disorder - continue home meds 3. HTN - continue home meds 4. A.Fib - continue rate control, patient INR 1.4 today and I am going to stop his coumadin at this point since in my opinion he is at very high fall risk having had ~3-4 separate falls in the past week alone and uncountable falling episodes over the past couple of months.  Will put him on ASA 325 daily instead at this point during inpatient stay, final anticoagulation decision should be made by PCP and/or cards of course.    Code Status: Full Code (must indicate code status--if unknown or must be presumed, indicate so) Family Communication: Spoke with Wife and daughter at length at bedside (indicate person spoken with, if applicable, with phone number if by telephone) Disposition Plan: Admit to obs (indicate anticipated LOS)  Time spent: 70 min  Valton Schwartz M. [facility] Pager [phone]  If 7PM-7AM, please contact night-coverage [email or web address] Password [facility] 01/07/2013, 8:36 PM

## 2013-01-08 DIAGNOSIS — E871 Hypo-osmolality and hyponatremia: Secondary | ICD-10-CM | POA: Diagnosis present

## 2013-01-08 DIAGNOSIS — I059 Rheumatic mitral valve disease, unspecified: Secondary | ICD-10-CM

## 2013-01-08 DIAGNOSIS — E860 Dehydration: Secondary | ICD-10-CM

## 2013-01-08 DIAGNOSIS — I10 Essential (primary) hypertension: Secondary | ICD-10-CM

## 2013-01-08 HISTORY — DX: Hypo-osmolality and hyponatremia: E87.1

## 2013-01-08 LAB — CBC
MCH: 32.7 pg (ref 26.0–34.0)
MCHC: 34.2 g/dL (ref 30.0–36.0)
Platelets: 130 10*3/uL — ABNORMAL LOW (ref 150–400)
RDW: 14 % (ref 11.5–15.5)

## 2013-01-08 LAB — BASIC METABOLIC PANEL
Calcium: 8 mg/dL — ABNORMAL LOW (ref 8.4–10.5)
GFR calc Af Amer: 86 mL/min — ABNORMAL LOW (ref >90)
GFR calc non Af Amer: 74 mL/min — ABNORMAL LOW (ref >90)
Sodium: 132 mEq/L — ABNORMAL LOW (ref 135–145)

## 2013-01-08 LAB — TESTOSTERONE: Testosterone: 245.97 ng/dL — ABNORMAL LOW (ref 300–890)

## 2013-01-08 LAB — VITAMIN B12: Vitamin B-12: 988 pg/mL — ABNORMAL HIGH (ref 211–911)

## 2013-01-08 MED ORDER — ENSURE COMPLETE PO LIQD
237.0000 mL | Freq: Two times a day (BID) | ORAL | Status: DC
  Administered 2013-01-08 – 2013-01-13 (×10): 237 mL via ORAL

## 2013-01-08 NOTE — Progress Notes (Addendum)
INITIAL NUTRITION ASSESSMENT  DOCUMENTATION CODES Per approved criteria  -Not Applicable   INTERVENTION: 1.  Supplements; Ensure Complete BID between afternoon meal and hs. 2.  Modify diet; recommend diet liberalization to Regular 3.  Meals/snacks; will continue home regimen of mid-morning snack  NUTRITION DIAGNOSIS: Unintended wt change related to weakness as evidenced by pt with 22 lbs wt loss in the past month.   Monitor:  1.  Food/Beverage; continued intake >/=75% of meals.  Reason for Assessment: MST  79 y.o. male  Admitting Dx: Recurrent falls  ASSESSMENT: Pt admitted with frequent falls with unknown etiology.  Pt with 3-4 falls in the last week without bony injury.  Pt with generalized weakness. Wife states that pt has had decreased intake over the past few months.  She states pt has kept usual meal times of 7a, 11a, and 4p with one mid-morning snack, however his portion sizes at meals have decreased.  She reports his usual wt as 170 lbs "several months ago."   Suspect some level of undernourishment.  Note ongoing plans re: d/c home with assistance vs. Placement. PO 100% of meal this afternoon  Height: Ht Readings from Last 1 Encounters:  01/07/13 5\' 9"  (1.753 m)    Weight: Wt Readings from Last 1 Encounters:  01/07/13 148 lb 9.4 oz (67.4 kg)    Ideal Body Weight: 72.7kg  % Ideal Body Weight: 92%  Wt Readings from Last 10 Encounters:  01/07/13 148 lb 9.4 oz (67.4 kg)  01/14/12 160 lb (72.576 kg)  12/09/11 152 lb 8.9 oz (69.2 kg)    Usual Body Weight: 170lbs  % Usual Body Weight: 87%  BMI:  Body mass index is 21.94 kg/(m^2).  Estimated Nutritional Needs: Kcal: 1610-9604 Protein: 67-80g Fluid: ~2.0 L/day  Skin: wnl  Diet Order: Cardiac  EDUCATION NEEDS: -Education needs addressed   Intake/Output Summary (Last 24 hours) at 01/08/13 1310 Last data filed at 01/08/13 1055  Gross per 24 hour  Intake    240 ml  Output    500 ml  Net   -260 ml     Last BM: 1/23  Labs:   Lab 01/08/13 0500 01/07/13 1648 01/06/13 1200  NA 132* 133* 134*  K 4.1 4.6 4.7  CL 98 98 97  CO2 22 23 20   BUN 24* 28* 40*  CREATININE 0.95 1.03 1.50*  CALCIUM 8.0* 8.5 8.6  MG -- -- --  PHOS -- -- --  GLUCOSE 105* 129* 114*    CBG (last 3)  No results found for this basename: GLUCAP:3 in the last 72 hours  Scheduled Meds:   . sodium chloride   Intravenous STAT  . aspirin EC  325 mg Oral Daily  . cyanocobalamin  1,000 mcg Intramuscular Q30 days  . docusate sodium  100 mg Oral QHS  . escitalopram  10 mg Oral Daily  . heparin  5,000 Units Subcutaneous Q8H  . hydrALAZINE  100 mg Oral TID  . irbesartan  75 mg Oral Daily  . labetalol  200 mg Oral TID  . levETIRAcetam  500 mg Oral Q12H  . phenytoin  200 mg Oral BID  . risperiDONE  0.5 mg Oral QHS  . sodium chloride  3 mL Intravenous Q12H  . Vitamin D (Ergocalciferol)  50,000 Units Oral Q7 days    Continuous Infusions:   . sodium chloride 75 mL/hr (01/08/13 0156)    Past Medical History  Diagnosis Date  . Hypertension   . Epilepsy   . CAD (  coronary artery disease)   . Hx of CABG   . Stroke   . B12 deficiency   . Low testosterone   . BPH (benign prostatic hyperplasia)   . A-fib   . Fall   . Seizures   . Hyperlipidemia   . Chronic anticoagulation     Past Surgical History  Procedure Date  . Back surgery 05/2011    cement fusion of broken vertebrae  . Cardiac surgery   . Hernia repair   . Coronary artery bypass graft   . Cardiac catheterization 02/11/2007    EF 55-60%  . Cardiac catheterization 01/04/2005    EF 55%  . Angioplasty 1987  . Hernia repair   . Pilonidal cyst excision   . US echocardiography 01/18/2009    EF 55-60%  . US echocardiography 03/11/2003    EF 55-60%  . Cardiovascular stress test 11/05/2004    NO EVIDENCE OF ISCHEMIA    Loyce Dys, MS RD LDN Clinical Inpatient Dietitian Pager: [phone] Weekend/After hours pager: [phone]

## 2013-01-08 NOTE — Progress Notes (Addendum)
[facility] PROGRESS NOTE  Joseph Bentley ZOX:[id number] DOB: 07-01-1926 DOA: 01/07/2013 PCP: Ezequiel Kayser, MD  Assessment/Plan: Principal Problem:  *Recurrent falls-  -unclear if any syncope - await echo - PT/OT consult - vit B12 wnl, and testosterone level low Active Problems:  Seizure  - continue outpt meds, remains sz free HTN (hypertension)  -continue labetalol Atrial fibrillation - agree with discontinuation of coumadin 2/2 fall risk -final anticoagulation decision should be made by PCP and/or cards of course on outpt follow up. -continue ASA - rate remains controlled Hyponatremia -hydrate, follow and recheck    Code Status: full  Family Communication: wife at bedside Disposition Plan: pending PT eval   Consultants:  none  Procedures:  Echo - pending  Antibiotics:  none  HPI/Subjective: Pt denies chest pain, dizziness and no SOB. Per wife he falls because his legs are weak.  Objective: Filed Vitals:   01/07/13 2119 01/08/13 0447 01/08/13 0940 01/08/13 1545  BP: 187/86 178/89 175/82 170/84  Pulse: 96 94  80  Temp: 98.1 F (36.7 C) 98.1 F (36.7 C)    TempSrc: Oral Oral    Resp: 18 18    Height: 5\' 9"  (1.753 m)     Weight: 67.4 kg (148 lb 9.4 oz)     SpO2: 98% 95%      Intake/Output Summary (Last 24 hours) at 01/08/13 1641 Last data filed at 01/08/13 1529  Gross per 24 hour  Intake    840 ml  Output    500 ml  Net    340 ml   Filed Weights   01/07/13 2119  Weight: 67.4 kg (148 lb 9.4 oz)    Exam:   General: elderly male in NAD, alert and appropriate  Cardiovascular: RRR, nl S1S2  Respiratory: CTAB  Abdomen: Soft +BS, NT/ND  Data Reviewed: Basic Metabolic Panel:  Lab 01/08/13 8119 01/07/13 1648 01/06/13 1200 01/04/13 0230 01/03/13 1523  NA 132* 133* 134* -- 135  K 4.1 4.6 4.7 4.7 5.5*  CL 98 98 97 -- 99  CO2 22 23 20  -- 25  GLUCOSE 105* 129* 114* -- 113*  BUN 24* 28* 40* -- 22  CREATININE 0.95 1.03 1.50* -- 1.24    CALCIUM 8.0* 8.5 8.6 -- 8.9  MG -- -- -- -- --  PHOS -- -- -- -- --   Liver Function Tests:  Lab 01/06/13 1200 01/03/13 1523  AST 23 17  ALT 11 8  ALKPHOS 127* 147*  BILITOT 0.5 0.4  PROT 6.5 6.9  ALBUMIN 3.8 3.8   No results found for this basename: LIPASE:5,AMYLASE:5 in the last 168 hours No results found for this basename: AMMONIA:5 in the last 168 hours CBC:  Lab 01/08/13 0500 01/07/13 1648 01/06/13 1200 01/03/13 1523  WBC 5.8 7.4 7.2 5.9  NEUTROABS -- 5.9 5.8 4.2  HGB 14.5 15.7 15.7 16.4  HCT 42.4 45.4 46.9 48.9  MCV 95.7 96.2 97.3 96.4  PLT 130* 135* 161 190   Cardiac Enzymes:  Lab 01/07/13 1648 01/06/13 1200  CKTOTAL -- --  CKMB -- --  CKMBINDEX -- --  TROPONINI <0.30 <0.30   BNP (last 3 results) No results found for this basename: PROBNP:3 in the last 8760 hours CBG: No results found for this basename: GLUCAP:5 in the last 168 hours  No results found for this or any previous visit (from the past 240 hour(s)).   Studies: Dg Chest 2 View  01/07/2013  *RADIOLOGY REPORT*  Clinical Data: Cough, weakness.  CHEST -  2 VIEW  Comparison: 01/06/2013  Findings: Mild cardiomegaly.  Prior CABG.  Bibasilar opacities, likely atelectasis.  No effusions.  No acute bony abnormality.  No real change.  IMPRESSION: Cardiomegaly, low volumes with bibasilar atelectasis.  No real change.   Original Report Authenticated By: Charlett Nose, M.D.    Ct Head Wo Contrast  01/07/2013  *RADIOLOGY REPORT*  Clinical Data: Weakness.  CT HEAD WITHOUT CONTRAST  Technique:  Contiguous axial images were obtained from the base of the skull through the vertex without contrast.  Comparison: 01/06/2013  Findings: There is atrophy and chronic small vessel disease changes. No acute intracranial abnormality.  Specifically, no hemorrhage, hydrocephalus, mass lesion, acute infarction, or significant intracranial injury.  No acute calvarial abnormality. The mastoids are clear.  Mucosal thickening in the ethmoid  air cells.  IMPRESSION: No acute intracranial abnormality.  Atrophy, chronic microvascular disease.   Original Report Authenticated By: Charlett Nose, M.D.     Scheduled Meds:   . sodium chloride   Intravenous STAT  . aspirin EC  325 mg Oral Daily  . cyanocobalamin  1,000 mcg Intramuscular Q30 days  . docusate sodium  100 mg Oral QHS  . escitalopram  10 mg Oral Daily  . feeding supplement  237 mL Oral BID  . heparin  5,000 Units Subcutaneous Q8H  . hydrALAZINE  100 mg Oral TID  . irbesartan  75 mg Oral Daily  . labetalol  200 mg Oral TID  . levETIRAcetam  500 mg Oral Q12H  . phenytoin  200 mg Oral BID  . risperiDONE  0.5 mg Oral QHS  . sodium chloride  3 mL Intravenous Q12H  . Vitamin D (Ergocalciferol)  50,000 Units Oral Q7 days   Continuous Infusions:   . sodium chloride 75 mL/hr (01/08/13 0156)    Principal Problem:  *Recurrent falls Active Problems:  Seizure  HTN (hypertension)  Atrial fibrillation  Anticoagulant long-term use    Time spent:    [facility] C  [facility] Pager [phone]. If 8PM-8AM, please contact night-coverage at [email or web address], password [facility] 01/08/2013, 4:41 PM  LOS: 1 day

## 2013-01-08 NOTE — Care Management Note (Signed)
    Page 1 of 1   01/13/2013     3:32:06 PM   CARE MANAGEMENT NOTE 01/13/2013  Patient:  Joseph Bentley   Account Number:  [id number]  Date Initiated:  01/08/2013  Documentation initiated by:  Lanier Clam  Subjective/Objective Assessment:   ADMITTED W/FALL.WJ:XBJYNWGN FALLS,STROKE,HTN,CAD.     Action/Plan:   FROM HOME W/SPOUSE.HAS PCP,PHARMACY.   Anticipated DC Date:  01/13/2013   Anticipated DC Plan:  SKILLED NURSING FACILITY      DC Planning Services  CM consult      Choice offered to / List presented to:             Status of service:  Completed, signed off Medicare Important Message given?  YES (If response is "NO", the following Medicare IM given date fields will be blank) Date Medicare IM given:   Date Additional Medicare IM given:  01/12/2013  Discharge Disposition:  SKILLED NURSING FACILITY  Per UR Regulation:  Reviewed for med. necessity/level of care/duration of stay  If discussed at Long Length of Stay Meetings, dates discussed:   01/12/2013    Comments:  01/13/13 Eline Geng RN,BSN NCM [phone] D/C SNF.  01/11/13 Bejamin Hackbart RN,BSN NCM [phone] PT-SNF.  01/08/13 Dare Spillman RN,BSN NCM [phone] RECOMMEND PT/OT CONS WHEN MD FEEL MED APPROPRIATE.

## 2013-01-08 NOTE — Progress Notes (Signed)
  Echocardiogram 2D Echocardiogram has been performed.  Joseph Bentley 01/08/2013, 9:20 AM

## 2013-01-08 NOTE — Clinical Social Work Psychosocial (Unsigned)
     Clinical Social Work Department BRIEF PSYCHOSOCIAL ASSESSMENT 01/08/2013  Patient:  Joseph Bentley, Joseph Bentley     Account Number:  [id number]     Admit date:  01/07/2013  Clinical Social Worker:  Hattie Perch  Date/Time:  01/08/2013 12:00 M  Referred by:  Physician  Date Referred:  01/08/2013 Referred for  SNF Placement   Other Referral:   Interview type:  Family Other interview type:    PSYCHOSOCIAL DATA Living Status:  FAMILY Admitted from facility:   Level of care:   Primary support name:  francis Cdebaca Primary support relationship to patient:  SPOUSE Degree of support available:   good    CURRENT CONCERNS Current Concerns  Post-Acute Placement   Other Concerns:    SOCIAL WORK ASSESSMENT / PLAN CSW met with patient and patient spouse at bedside. patient is currently confused. discussed probable need for snf placement. patient spouse agreeable to same and requesting [facility] or [facility].   Assessment/plan status:   Other assessment/ plan:   Information/referral to community resources:    PATIENTS/FAMILYS RESPONSE TO PLAN OF CARE: agreeable to snf upon discharge if patient meets inpatient criteria. if not, spouse is prepared to hire 24/7 nursing care for patient.

## 2013-01-09 DIAGNOSIS — E871 Hypo-osmolality and hyponatremia: Secondary | ICD-10-CM

## 2013-01-09 LAB — BASIC METABOLIC PANEL
CO2: 26 mEq/L (ref 19–32)
Calcium: 7.8 mg/dL — ABNORMAL LOW (ref 8.4–10.5)
Chloride: 98 mEq/L (ref 96–112)
Glucose, Bld: 144 mg/dL — ABNORMAL HIGH (ref 70–99)
Sodium: 130 mEq/L — ABNORMAL LOW (ref 135–145)

## 2013-01-09 LAB — URINE CULTURE: Colony Count: 3000

## 2013-01-09 NOTE — Evaluation (Signed)
Occupational Therapy Evaluation Patient Details Name: Joseph Bentley MRN: [id number] DOB: 04-18-1926 Today's Date: 01/09/2013 Time: 6962-9528 OT Time Calculation (min): 27 min  OT Assessment / Plan / Recommendation Clinical Impression  Pt admitted after multiple falls at home.  Pt with decreased safety awareness and is non compliant with medications and awaiting assist with wife.  Pt currently requires +2 assist for OOB activity and is dependent in bathing and dressing.  Recommending SNF for ST rehab.  Will follow acutely.    OT Assessment  Patient needs continued OT Services    Follow Up Recommendations  SNF    Barriers to Discharge Decreased caregiver support    Equipment Recommendations  None recommended by OT    Recommendations for Other Services    Frequency  Min 2X/week    Precautions / Restrictions Precautions Precautions: Fall   Pertinent Vitals/Pain No pain    ADL  Eating/Feeding: Set up (increased spillage) Where Assessed - Eating/Feeding: Chair Grooming: Wash/dry face;Wash/dry hands;Set up Where Assessed - Grooming: Supported sitting Upper Body Bathing: Moderate assistance Where Assessed - Upper Body Bathing: Supported sitting Lower Body Bathing: +1 Total assistance Where Assessed - Lower Body Bathing: Unsupported sitting;Supported sit to stand Upper Body Dressing: Moderate assistance Where Assessed - Upper Body Dressing: Unsupported sitting Lower Body Dressing: +1 Total assistance Where Assessed - Lower Body Dressing: Unsupported sitting;Supported sit to stand Equipment Used: Gait belt;Rolling walker Transfers/Ambulation Related to ADLs: +2 total assist transfer pt 40%, posterior and L side strong lean, unable to ambulate    OT Diagnosis: Generalized weakness;Cognitive deficits  OT Problem List: Decreased activity tolerance;Impaired balance (sitting and/or standing);Decreased cognition;Decreased safety awareness;Impaired UE functional use OT Treatment  Interventions: Self-care/ADL training;DME and/or AE instruction;Therapeutic activities;Balance training;Patient/family education;Cognitive remediation/compensation   OT Goals Acute Rehab OT Goals OT Goal Formulation: With patient Time For Goal Achievement: 01/23/13 Potential to Achieve Goals: Fair ADL Goals Pt Will Perform Eating: with modified independence;Supported;Sitting, chair ADL Goal: Eating - Progress: Goal set today Pt Will Perform Grooming: Standing at sink;with min assist (one activity) ADL Goal: Grooming - Progress: Goal set today Pt Will Transfer to Toilet: with min assist;3-in-1 ADL Goal: Toilet Transfer - Progress: Goal set today Miscellaneous OT Goals Miscellaneous OT Goal #1: Pt will sit EOB x 5 unsupported with supervision while involved in reaching activities in prep for ADL. OT Goal: Miscellaneous Goal #1 - Progress: Goal set today  Visit Information  Last OT Received On: 01/09/13 Assistance Needed: +2 PT/OT Co-Evaluation/Treatment: Yes    Subjective Data  Subjective: "I can't take care of him at home."--wife Patient Stated Goal: Family wants ST rehab and then to return home with more support.   Prior Functioning     Home Living Lives With: Spouse Available Help at Discharge: Skilled Nursing Facility Type of Home: Skilled Nursing Facility Prior Function Level of Independence: Needs assistance Needs Assistance: Bathing;Dressing;Meal Prep;Light Housekeeping;Gait;Transfers Bath: Moderate Dressing: Moderate Meal Prep: Total Light Housekeeping: Total Gait Assistance: Assist to ambulate with RW, many falls backward. Transfer Assistance: Wife assisted with all transfers. Driving: No Vocation: Retired Musician: HOH;Expressive difficulties (difficult to understand speech) Dominant Hand: Right         Vision/Perception     Cognition  Overall Cognitive Status: Impaired Area of Impairment: Following  commands;Safety/judgement;Awareness of errors;Awareness of deficits Arousal/Alertness: Awake/alert Orientation Level: Disoriented to;Situation Behavior During Session: [facility] for tasks performed Following Commands: Follows one step commands inconsistently (HOH?) Safety/Judgement: Decreased safety judgement for tasks assessed;Decreased awareness of need for assistance Awareness of  Errors: Assistance required to identify errors made    Extremity/Trunk Assessment Right Upper Extremity Assessment RUE ROM/Strength/Tone: [facility] for tasks assessed (decreased shoulder ROM due to kyphosis) RUE Coordination: Deficits RUE Coordination Deficits: wife reports pt frequently dropping his pills, increased spillage with eating Left Upper Extremity Assessment LUE ROM/Strength/Tone: WFL for tasks assessed (limited shoulder ROM due to kyphosis) LUE Coordination: Deficits Trunk Assessment Trunk Assessment: Kyphotic     Mobility Bed Mobility Bed Mobility: Supine to Sit;Sitting - Scoot to Edge of Bed Supine to Sit: 5: Supervision;HOB elevated;With rails Sitting - Scoot to Edge of Bed: 5: Supervision Details for Bed Mobility Assistance: requires extra time for bed mobility Transfers Transfers: Sit to Stand;Stand to Sit Sit to Stand: 1: +2 Total assist;With upper extremity assist;From bed Sit to Stand: Patient Percentage: 40% Stand to Sit: 1: +2 Total assist;With upper extremity assist;To chair/3-in-1 Stand to Sit: Patient Percentage: 40% Details for Transfer Assistance: Pt with strong posterior and L side lean during transfer. Pulls up on walker, wife reports he typically pulls rather than pushing up into standing.     Shoulder Instructions     Exercise     Balance Balance Balance Assessed: Yes Static Sitting Balance Static Sitting - Balance Support: Feet supported Static Sitting - Level of Assistance: 4: Min assist Static Sitting - Comment/# of Minutes: 5   End of Session OT - End of Session Activity  Tolerance: Patient tolerated treatment well Patient left: in chair;with call bell/phone within reach;with family/visitor present Nurse Communication: Mobility status  GO Functional Assessment Tool Used: clinical judgement Functional Limitation: Self care Self Care Current Status (Z6109): At least 40 percent but less than 60 percent impaired, limited or restricted Self Care Goal Status (U0454): At least 20 percent but less than 40 percent impaired, limited or restricted   Evern Bio 01/09/2013, 11:39 AM [phone]

## 2013-01-09 NOTE — Progress Notes (Signed)
[facility] PROGRESS NOTE  Joseph Bentley AVW:[id number] DOB: December 21, 1925 DOA: 01/07/2013 PCP: Ezequiel Kayser, MD  Assessment/Plan: Principal Problem:  *Recurrent falls-  -unclear if any syncope -  Echo with EF 60-65% and only mild AS - awaiting PT eval, OT recommending SNF - vit B12 wnl, and testosterone level low Active Problems:  Seizure  - continue outpt meds, remains sz free HTN (hypertension)  -continue labetalol Atrial fibrillation - agree with discontinuation of coumadin 2/2 fall risk -final anticoagulation decision should be made by PCP and/or cards of course on outpt follow up. -continue ASA - rate remains controlled Hyponatremia -Na trending down, continue hydration, ontain urine lytes Cough - CXR on 1/23 - neg, will place on antitussives and follow    Code Status: full  Family Communication:  Disposition Plan: likely SNF   Consultants:  none  Procedures:  Echo - pending  Antibiotics:  none  HPI/Subjective: C/o cough, no sob, no CP  Objective: Filed Vitals:   01/08/13 1400 01/08/13 1545 01/08/13 2137 01/09/13 0440  BP: 166/81 170/84 161/68 179/74  Pulse: 95 80 76 94  Temp: 98 F (36.7 C)  98.1 F (36.7 C) 98.2 F (36.8 C)  TempSrc: Oral  Oral Oral  Resp: 18  18 18   Height:      Weight:      SpO2: 93%  95% 93%    Intake/Output Summary (Last 24 hours) at 01/09/13 1223 Last data filed at 01/09/13 0440  Gross per 24 hour  Intake 1703.75 ml  Output    450 ml  Net 1253.75 ml   Filed Weights   01/07/13 2119  Weight: 67.4 kg (148 lb 9.4 oz)    Exam:   General: elderly male in NAD, alert and appropriate  Cardiovascular: RRR, nl S1S2  Respiratory: CTAB, no crackles or wheezes  Abdomen: Soft +BS, NT/ND  EXT: no edema  Data Reviewed: Basic Metabolic Panel:  Lab 01/09/13 8295 01/08/13 0500 01/07/13 1648 01/06/13 1200 01/04/13 0230 01/03/13 1523  NA 130* 132* 133* 134* -- 135  K 4.1 4.1 4.6 4.7 4.7 --  CL 98 98 98 97 -- 99   CO2 26 22 23 20  -- 25  GLUCOSE 144* 105* 129* 114* -- 113*  BUN 22 24* 28* 40* -- 22  CREATININE 1.04 0.95 1.03 1.50* -- 1.24  CALCIUM 7.8* 8.0* 8.5 8.6 -- 8.9  MG -- -- -- -- -- --  PHOS -- -- -- -- -- --   Liver Function Tests:  Lab 01/06/13 1200 01/03/13 1523  AST 23 17  ALT 11 8  ALKPHOS 127* 147*  BILITOT 0.5 0.4  PROT 6.5 6.9  ALBUMIN 3.8 3.8   No results found for this basename: LIPASE:5,AMYLASE:5 in the last 168 hours No results found for this basename: AMMONIA:5 in the last 168 hours CBC:  Lab 01/08/13 0500 01/07/13 1648 01/06/13 1200 01/03/13 1523  WBC 5.8 7.4 7.2 5.9  NEUTROABS -- 5.9 5.8 4.2  HGB 14.5 15.7 15.7 16.4  HCT 42.4 45.4 46.9 48.9  MCV 95.7 96.2 97.3 96.4  PLT 130* 135* 161 190   Cardiac Enzymes:  Lab 01/07/13 1648 01/06/13 1200  CKTOTAL -- --  CKMB -- --  CKMBINDEX -- --  TROPONINI <0.30 <0.30   BNP (last 3 results) No results found for this basename: PROBNP:3 in the last 8760 hours CBG: No results found for this basename: GLUCAP:5 in the last 168 hours  Recent Results (from the past 240 hour(s))  URINE CULTURE  Status: Normal   Collection Time   01/07/13  7:03 PM      Component Value Range Status Comment   Specimen Description URINE, CLEAN CATCH   Final    Special Requests NONE   Final    Culture  Setup Time 01/08/2013 02:17   Final    Colony Count 3,000 COLONIES/ML   Final    Culture INSIGNIFICANT GROWTH   Final    Report Status 01/09/2013 FINAL   Final      Studies: Dg Chest 2 View  01/07/2013  *RADIOLOGY REPORT*  Clinical Data: Cough, weakness.  CHEST - 2 VIEW  Comparison: 01/06/2013  Findings: Mild cardiomegaly.  Prior CABG.  Bibasilar opacities, likely atelectasis.  No effusions.  No acute bony abnormality.  No real change.  IMPRESSION: Cardiomegaly, low volumes with bibasilar atelectasis.  No real change.   Original Report Authenticated By: Charlett Nose, M.D.    Ct Head Wo Contrast  01/07/2013  *RADIOLOGY REPORT*  Clinical  Data: Weakness.  CT HEAD WITHOUT CONTRAST  Technique:  Contiguous axial images were obtained from the base of the skull through the vertex without contrast.  Comparison: 01/06/2013  Findings: There is atrophy and chronic small vessel disease changes. No acute intracranial abnormality.  Specifically, no hemorrhage, hydrocephalus, mass lesion, acute infarction, or significant intracranial injury.  No acute calvarial abnormality. The mastoids are clear.  Mucosal thickening in the ethmoid air cells.  IMPRESSION: No acute intracranial abnormality.  Atrophy, chronic microvascular disease.   Original Report Authenticated By: Charlett Nose, M.D.     Scheduled Meds:    . aspirin EC  325 mg Oral Daily  . cyanocobalamin  1,000 mcg Intramuscular Q30 days  . docusate sodium  100 mg Oral QHS  . escitalopram  10 mg Oral Daily  . feeding supplement  237 mL Oral BID  . heparin  5,000 Units Subcutaneous Q8H  . hydrALAZINE  100 mg Oral TID  . irbesartan  75 mg Oral Daily  . labetalol  200 mg Oral TID  . levETIRAcetam  500 mg Oral Q12H  . phenytoin  200 mg Oral BID  . risperiDONE  0.5 mg Oral QHS  . sodium chloride  3 mL Intravenous Q12H  . Vitamin D (Ergocalciferol)  50,000 Units Oral Q7 days   Continuous Infusions:    . sodium chloride 75 mL/hr (01/08/13 0156)    Principal Problem:  *Recurrent falls Active Problems:  Seizure  HTN (hypertension)  Atrial fibrillation  Anticoagulant long-term use  Hyponatremia    Time spent:    [facility] C  [facility] Pager [phone]. If 8PM-8AM, please contact night-coverage at [email or web address], password [facility] 01/09/2013, 12:23 PM  LOS: 2 days

## 2013-01-09 NOTE — Plan of Care (Signed)
Problem: Phase I Progression Outcomes Goal: Initial discharge plan identified Outcome: Completed/Met Date Met:  01/09/13 SNF

## 2013-01-09 NOTE — Progress Notes (Signed)
Pt continues to spit up bloody mucous.

## 2013-01-09 NOTE — Evaluation (Signed)
Physical Therapy Evaluation Patient Details Name: Joseph Bentley MRN: [id number] DOB: May 31, 1926 Today's Date: 01/09/2013 Time: 4401-0272 PT Time Calculation (min): 22 min  PT Assessment / Plan / Recommendation Clinical Impression  Pt admitted for multiple falls at home.  Pt has decreased safety awareness and decreased awareness for assistance.  Pt is currently +2 assist for OOB.  Pt would benefit from acute PT services in order to improve independence with transfers and gait by improving activity tolerance and improving balance to prepare for d/c to SNF.    PT Assessment  Patient needs continued PT services    Follow Up Recommendations  SNF;Supervision/Assistance - 24 hour    Does the patient have the potential to tolerate intense rehabilitation      Barriers to Discharge        Equipment Recommendations  None recommended by PT    Recommendations for Other Services     Frequency Min 3X/week    Precautions / Restrictions Precautions Precautions: Fall   Pertinent Vitals/Pain n/a      Mobility  Bed Mobility Bed Mobility: Supine to Sit;Sitting - Scoot to Edge of Bed Supine to Sit: 5: Supervision;HOB elevated;With rails Sitting - Scoot to Edge of Bed: 5: Supervision Details for Bed Mobility Assistance: increased time Transfers Transfers: Stand to Sit;Sit to Stand;Stand Pivot Transfers Sit to Stand: 1: +2 Total assist;With upper extremity assist;From bed Sit to Stand: Patient Percentage: 40% Stand to Sit: 1: +2 Total assist;With upper extremity assist;To chair/3-in-1 Stand to Sit: Patient Percentage: 40% Stand Pivot Transfers: 1: +2 Total assist Stand Pivot Transfers: Patient Percentage: 40% Details for Transfer Assistance: spouse reports pt typically pulls up on furniture or walker at home, does not push up with UEs, strong posterior and left lateral lean upon standing, verbal cues for safety, pt walked feet backward upon standing and performed posterior lean with LEs  pushing into bed, upon initiating transfer to recliner strong posterior and L lateral lean again requiring increased assist Ambulation/Gait Ambulation/Gait Assistance: Not tested (comment)    Shoulder Instructions     Exercises     PT Diagnosis: Difficulty walking  PT Problem List: Decreased strength;Decreased activity tolerance;Decreased balance;Decreased mobility;Decreased safety awareness;Decreased knowledge of use of DME PT Treatment Interventions: DME instruction;Gait training;Neuromuscular re-education;Functional mobility training;Patient/family education;Therapeutic activities;Therapeutic exercise;Balance training   PT Goals Acute Rehab PT Goals PT Goal Formulation: With patient Time For Goal Achievement: 01/23/13 Potential to Achieve Goals: Good Pt will go Supine/Side to Sit: with modified independence PT Goal: Supine/Side to Sit - Progress: Goal set today Pt will go Sit to Stand: with min assist PT Goal: Sit to Stand - Progress: Goal set today Pt will go Stand to Sit: with min assist PT Goal: Stand to Sit - Progress: Goal set today Pt will Ambulate: with min assist;16 - 50 feet;with least restrictive assistive device PT Goal: Ambulate - Progress: Goal set today Additional Goals Additional Goal #1: Pt will sit EOB for 3 minutes with no leaning with supervision. PT Goal: Additional Goal #1 - Progress: Goal set today  Visit Information  Last PT Received On: 01/09/13 Assistance Needed: +2    Subjective Data  Subjective: Oh good, I'd like to walk.   Prior Functioning  Home Living Lives With: Spouse Available Help at Discharge: Skilled Nursing Facility Type of Home: Skilled Nursing Facility Prior Function Level of Independence: Needs assistance Needs Assistance: Bathing;Dressing;Meal Prep;Light Housekeeping;Gait;Transfers Bath: Moderate Dressing: Moderate Meal Prep: Total Light Housekeeping: Total Gait Assistance: Assist to ambulate with RW, many falls  backward. Transfer Assistance: Wife assisted with all transfers. Driving: No Vocation: Retired Musician: HOH;Expressive difficulties (difficult to understand speech) Dominant Hand: Right    Cognition  Overall Cognitive Status: Impaired Area of Impairment: Following commands;Safety/judgement;Awareness of errors;Awareness of deficits Arousal/Alertness: Awake/alert Orientation Level: Disoriented to;Situation Behavior During Session: [facility] for tasks performed Following Commands: Follows one step commands inconsistently (HOH?) Safety/Judgement: Decreased safety judgement for tasks assessed;Decreased awareness of need for assistance Awareness of Errors: Assistance required to identify errors made Cognition - Other Comments: Pt able to follow commands at times, other times did not ?hearing and/or due to ability (weakness)    Extremity/Trunk Assessment Right Upper Extremity Assessment RUE ROM/Strength/Tone: [facility] for tasks assessed (decreased shoulder ROM due to kyphosis) RUE Coordination: Deficits RUE Coordination Deficits: wife reports pt frequently dropping his pills, increased spillage with eating Left Upper Extremity Assessment LUE ROM/Strength/Tone: WFL for tasks assessed (limited shoulder ROM due to kyphosis) LUE Coordination: Deficits Right Lower Extremity Assessment RLE ROM/Strength/Tone: Deficits RLE ROM/Strength/Tone Deficits: all MMT at least 4/5 throughout however functional weakness observed Left Lower Extremity Assessment LLE ROM/Strength/Tone: Deficits LLE ROM/Strength/Tone Deficits: all MMT at least 4/5 throughout however functional weakness observed Trunk Assessment Trunk Assessment: Kyphotic (neck flexion resting posture)   Balance Balance Balance Assessed: Yes Static Sitting Balance Static Sitting - Balance Support: Feet supported;Bilateral upper extremity supported Static Sitting - Level of Assistance: 4: Min assist Static Sitting - Comment/# of  Minutes: 5 minutes EOB to prepare for transfer, tends to have kyphotic posture with increased neck flexion however able to improve slighty with verbal cue for posture, posterior left lean upon fatigue requiring cues to correct  End of Session PT - End of Session Equipment Utilized During Treatment: Gait belt Activity Tolerance: Patient limited by fatigue Patient left: in chair;with call bell/phone within reach;with family/visitor present Nurse Communication: Mobility status  GP     Toy Samarin,KATHrine E 01/09/2013, 1:02 PM  Zenovia Jarred, PT, DPT 01/09/2013 Pager: [phone]

## 2013-01-10 LAB — BASIC METABOLIC PANEL
CO2: 25 mEq/L (ref 19–32)
Chloride: 98 mEq/L (ref 96–112)
Creatinine, Ser: 1.01 mg/dL (ref 0.50–1.35)
Potassium: 4 mEq/L (ref 3.5–5.1)

## 2013-01-10 MED ORDER — GUAIFENESIN 100 MG/5ML PO SOLN
200.0000 mg | ORAL | Status: DC | PRN
  Administered 2013-01-10 – 2013-01-11 (×3): 200 mg via ORAL
  Filled 2013-01-10: qty 10
  Filled 2013-01-10: qty 118
  Filled 2013-01-10: qty 10

## 2013-01-10 NOTE — Progress Notes (Signed)
[facility] PROGRESS NOTE  Joseph Bentley EAV:[id number] DOB: 03-18-1926 DOA: 01/07/2013 PCP: Ezequiel Kayser, MD  Assessment/Plan: Principal Problem:  *Recurrent falls-  -unclear if any syncope -  Echo with EF 60-65% and only mild AS - awaiting PT eval, OT recommending SNF - vit B12 wnl, and testosterone level low Active Problems:  Seizure  - continue outpt meds, remains sz free HTN (hypertension)  -continue labetalol Atrial fibrillation - agree with discontinuation of coumadin 2/2 fall risk -final anticoagulation decision should be made by PCP and/or cards of course on outpt follow up. -continue ASA - rate remains controlled Hyponatremia -Na timproving, awaiting urine NA Cough - CXR on 1/23 - neg, will place on antitussives and follow    Code Status: full  Family Communication:  Disposition Plan: likely SNF   Consultants:  none  Procedures:  Echo   Antibiotics:  none  HPI/Subjective: Pt denies any new c/o, states cough better.  Objective: Filed Vitals:   01/09/13 0440 01/09/13 1437 01/09/13 2119 01/10/13 0631  BP: 179/74 131/68 142/71 182/74  Pulse: 94 80 93 104  Temp: 98.2 F (36.8 C) 98.5 F (36.9 C) 98.2 F (36.8 C) 97.4 F (36.3 C)  TempSrc: Oral Oral Oral Oral  Resp: 18 18 18 20   Height:      Weight:      SpO2: 93% 94% 93% 91%    Intake/Output Summary (Last 24 hours) at 01/10/13 1047 Last data filed at 01/10/13 0706  Gross per 24 hour  Intake   2110 ml  Output    800 ml  Net   1310 ml   Filed Weights   01/07/13 2119  Weight: 67.4 kg (148 lb 9.4 oz)    Exam:   General: elderly male in NAD, alert and appropriate  Cardiovascular: RRR, nl S1S2  Respiratory: CTAB, no crackles or wheezes  Abdomen: Soft +BS, NT/ND  EXT: no edema  Data Reviewed: Basic Metabolic Panel:  Lab 01/10/13 7829 01/09/13 0539 01/08/13 0500 01/07/13 1648 01/06/13 1200  NA 132* 130* 132* 133* 134*  K 4.0 4.1 4.1 4.6 4.7  CL 98 98 98 98 97  CO2 25  26 22 23 20   GLUCOSE 93 144* 105* 129* 114*  BUN 23 22 24* 28* 40*  CREATININE 1.01 1.04 0.95 1.03 1.50*  CALCIUM 7.9* 7.8* 8.0* 8.5 8.6  MG -- -- -- -- --  PHOS -- -- -- -- --   Liver Function Tests:  Lab 01/06/13 1200 01/03/13 1523  AST 23 17  ALT 11 8  ALKPHOS 127* 147*  BILITOT 0.5 0.4  PROT 6.5 6.9  ALBUMIN 3.8 3.8   No results found for this basename: LIPASE:5,AMYLASE:5 in the last 168 hours No results found for this basename: AMMONIA:5 in the last 168 hours CBC:  Lab 01/08/13 0500 01/07/13 1648 01/06/13 1200 01/03/13 1523  WBC 5.8 7.4 7.2 5.9  NEUTROABS -- 5.9 5.8 4.2  HGB 14.5 15.7 15.7 16.4  HCT 42.4 45.4 46.9 48.9  MCV 95.7 96.2 97.3 96.4  PLT 130* 135* 161 190   Cardiac Enzymes:  Lab 01/07/13 1648 01/06/13 1200  CKTOTAL -- --  CKMB -- --  CKMBINDEX -- --  TROPONINI <0.30 <0.30   BNP (last 3 results) No results found for this basename: PROBNP:3 in the last 8760 hours CBG: No results found for this basename: GLUCAP:5 in the last 168 hours  Recent Results (from the past 240 hour(s))  URINE CULTURE     Status: Normal  Collection Time   01/07/13  7:03 PM      Component Value Range Status Comment   Specimen Description URINE, CLEAN CATCH   Final    Special Requests NONE   Final    Culture  Setup Time 01/08/2013 02:17   Final    Colony Count 3,000 COLONIES/ML   Final    Culture INSIGNIFICANT GROWTH   Final    Report Status 01/09/2013 FINAL   Final      Studies: No results found.  Scheduled Meds:    . aspirin EC  325 mg Oral Daily  . cyanocobalamin  1,000 mcg Intramuscular Q30 days  . docusate sodium  100 mg Oral QHS  . escitalopram  10 mg Oral Daily  . feeding supplement  237 mL Oral BID  . heparin  5,000 Units Subcutaneous Q8H  . hydrALAZINE  100 mg Oral TID  . irbesartan  75 mg Oral Daily  . labetalol  200 mg Oral TID  . levETIRAcetam  500 mg Oral Q12H  . phenytoin  200 mg Oral BID  . risperiDONE  0.5 mg Oral QHS  . sodium chloride  3 mL  Intravenous Q12H  . Vitamin D (Ergocalciferol)  50,000 Units Oral Q7 days   Continuous Infusions:    . sodium chloride 75 mL/hr (01/08/13 0156)    Principal Problem:  *Recurrent falls Active Problems:  Seizure  HTN (hypertension)  Atrial fibrillation  Anticoagulant long-term use  Hyponatremia    Time spent:    [facility] C  [facility] Pager [phone]. If 8PM-8AM, please contact night-coverage at [email or web address], password [facility] 01/10/2013, 10:47 AM  LOS: 3 days

## 2013-01-11 LAB — BASIC METABOLIC PANEL
CO2: 25 mEq/L (ref 19–32)
Calcium: 7.6 mg/dL — ABNORMAL LOW (ref 8.4–10.5)
Chloride: 98 mEq/L (ref 96–112)
Glucose, Bld: 117 mg/dL — ABNORMAL HIGH (ref 70–99)
Potassium: 4 mEq/L (ref 3.5–5.1)
Sodium: 132 mEq/L — ABNORMAL LOW (ref 135–145)

## 2013-01-11 LAB — SODIUM, URINE, RANDOM: Sodium, Ur: 41 mEq/L

## 2013-01-11 MED ORDER — FUROSEMIDE 10 MG/ML IJ SOLN
20.0000 mg | Freq: Once | INTRAMUSCULAR | Status: AC
  Administered 2013-01-11: 20 mg via INTRAVENOUS
  Filled 2013-01-11: qty 2

## 2013-01-11 NOTE — Progress Notes (Signed)
Occupational Therapy Treatment Patient Details Name: Joseph Bentley MRN: [id number] DOB: [birth year] Today's Date: 01/11/2013 Time: 4098-1191 OT Time Calculation (min): 20 min  OT Assessment / Plan / Recommendation Comments on Treatment Session Pt limited by vomiting this pm.  Pt with strong posterior lean, especially in standing and leans back and to L in sitting.    Follow Up Recommendations  SNF    Barriers to Discharge       Equipment Recommendations  None recommended by OT    Recommendations for Other Services    Frequency Min 2X/week   Plan      Precautions / Restrictions Precautions Precautions: Fall Restrictions Weight Bearing Restrictions: No   Pertinent Vitals/Pain No c/o pain.  Sats 95% off 02; 97% on 02    ADL  Transfers/Ambulation Related to ADLs: sat eob with variable assistance.  Pt tended to lean back and to L.  Stood one time and pt leaned strongly posteriorly.  Returned to sitting and pt had projectile vomiting.  changed sheets and gown during session.  Pt making slow progress.    OT Diagnosis:    OT Problem List:   OT Treatment Interventions:     OT Goals Acute Rehab OT Goals Time For Goal Achievement: 01/23/13 Miscellaneous OT Goals Miscellaneous OT Goal #1: Pt will sit EOB x 5 unsupported with supervision while involved in reaching activities in prep for ADL. OT Goal: Miscellaneous Goal #1 - Progress: Other (comment) (slow progress)  Visit Information  Last OT Received On: 01/11/13 Assistance Needed: +2 PT/OT Co-Evaluation/Treatment: Yes    Subjective Data      Prior Functioning       Cognition  Overall Cognitive Status: Impaired Area of Impairment: Safety/judgement;Awareness of errors;Awareness of deficits Arousal/Alertness: Awake/alert Behavior During Session: Flat affect Following Commands: Follows one step commands inconsistently Awareness of Errors: Assistance required to identify errors made Cognition - Other Comments: Pt is very  HOH    Mobility  Shoulder Instructions Bed Mobility Rolling Right: 1: +2 Total assist Rolling Right: Patient Percentage: 0% (fatiqued) Rolling Left: 1: +2 Total assist Rolling Left: Patient Percentage: 0% (fatiqued) Supine to Sit: 1: +2 Total assist Supine to Sit: Patient Percentage: 50% Details for Bed Mobility Assistance: increased time Transfers Sit to Stand: 1: +2 Total assist;With upper extremity assist;From bed Sit to Stand: Patient Percentage: 20% (pt initiates standing but has strong posterior lean)       Exercises      Balance Static Sitting Balance Static Sitting - Balance Support: Feet supported (bil UE support and reaching forward with LUE support) Static Sitting - Level of Assistance: 5: Stand by assistance;4: Min assist;3: Mod assist (variable assist) Static Sitting - Comment/# of Minutes: 5 minutes   End of Session OT - End of Session Activity Tolerance: Other (comment) (limited by vomiting) Patient left: in bed;with call bell/phone within reach;with family/visitor present  GO     Ermel Verne 01/11/2013, 3:13 PM Marica Otter, OTR/L [phone] 01/11/2013

## 2013-01-11 NOTE — Progress Notes (Signed)
[facility] PROGRESS NOTE  Joseph Bentley:[id number] DOB: 17-Aug-1926 DOA: 01/07/2013 PCP: Ezequiel Kayser, MD  Assessment/Plan: Principal Problem:  *Recurrent falls-  -unclear if any syncope -  Echo with EF 60-65% and only mild AS - awaiting PT eval, OT recommending SNF - vit B12 wnl, and testosterone level low Active Problems:  Seizure  - continue outpt meds, remains sz free HTN (hypertension)  -continue labetalol Atrial fibrillation - agree with discontinuation of coumadin 2/2 fall risk -final anticoagulation decision should be made by PCP and/or cards of course on outpt follow up. -continue ASA - rate remains controlled Hyponatremia -Na improved - unchanged 1/26, still awaiting urine NA Cough/dyspnea - CXR on 1/23 - neg, continue on antitussives and follow  -Will Hep-Lock IV fluids and give a low dose of Lasix and follow   Code Status: full  Family Communication:  Disposition Plan: likely SNF   Consultants:  none  Procedures:  Echo   Antibiotics:  none  HPI/Subjective: staes he is no longer coughing, denies shortness of breath, but appears dyspneic this a.m.  Objective: Filed Vitals:   01/10/13 1650 01/10/13 2125 01/11/13 0413 01/11/13 0929  BP: 167/86 187/83 151/69 162/72  Pulse: 77 114 104 98  Temp:  99.2 F (37.3 C) 97.8 F (36.6 C)   TempSrc:  Oral Oral   Resp:  18    Height:      Weight:      SpO2:  91% 89%     Intake/Output Summary (Last 24 hours) at 01/11/13 1014 Last data filed at 01/11/13 0805  Gross per 24 hour  Intake 1716.25 ml  Output    725 ml  Net 991.25 ml   Filed Weights   01/07/13 2119  Weight: 67.4 kg (148 lb 9.4 oz)    Exam:   General: elderly male in NAD, alert and appropriate  Cardiovascular: RRR, nl S1S2  Respiratory: Basilar crackles, scattered rhonchi  Abdomen: Soft +BS, NT/ND  EXT: no edema  Data Reviewed: Basic Metabolic Panel:  Lab 01/10/13 9562 01/09/13 0539 01/08/13 0500 01/07/13 1648  01/06/13 1200  NA 132* 130* 132* 133* 134*  K 4.0 4.1 4.1 4.6 4.7  CL 98 98 98 98 97  CO2 25 26 22 23 20   GLUCOSE 93 144* 105* 129* 114*  BUN 23 22 24* 28* 40*  CREATININE 1.01 1.04 0.95 1.03 1.50*  CALCIUM 7.9* 7.8* 8.0* 8.5 8.6  MG -- -- -- -- --  PHOS -- -- -- -- --   Liver Function Tests:  Lab 01/06/13 1200  AST 23  ALT 11  ALKPHOS 127*  BILITOT 0.5  PROT 6.5  ALBUMIN 3.8   No results found for this basename: LIPASE:5,AMYLASE:5 in the last 168 hours No results found for this basename: AMMONIA:5 in the last 168 hours CBC:  Lab 01/08/13 0500 01/07/13 1648 01/06/13 1200  WBC 5.8 7.4 7.2  NEUTROABS -- 5.9 5.8  HGB 14.5 15.7 15.7  HCT 42.4 45.4 46.9  MCV 95.7 96.2 97.3  PLT 130* 135* 161   Cardiac Enzymes:  Lab 01/07/13 1648 01/06/13 1200  CKTOTAL -- --  CKMB -- --  CKMBINDEX -- --  TROPONINI <0.30 <0.30   BNP (last 3 results) No results found for this basename: PROBNP:3 in the last 8760 hours CBG: No results found for this basename: GLUCAP:5 in the last 168 hours  Recent Results (from the past 240 hour(s))  URINE CULTURE     Status: Normal   Collection Time  01/07/13  7:03 PM      Component Value Range Status Comment   Specimen Description URINE, CLEAN CATCH   Final    Special Requests NONE   Final    Culture  Setup Time 01/08/2013 02:17   Final    Colony Count 3,000 COLONIES/ML   Final    Culture INSIGNIFICANT GROWTH   Final    Report Status 01/09/2013 FINAL   Final      Studies: No results found.  Scheduled Meds:    . aspirin EC  325 mg Oral Daily  . cyanocobalamin  1,000 mcg Intramuscular Q30 days  . docusate sodium  100 mg Oral QHS  . escitalopram  10 mg Oral Daily  . feeding supplement  237 mL Oral BID  . heparin  5,000 Units Subcutaneous Q8H  . hydrALAZINE  100 mg Oral TID  . irbesartan  75 mg Oral Daily  . labetalol  200 mg Oral TID  . levETIRAcetam  500 mg Oral Q12H  . phenytoin  200 mg Oral BID  . risperiDONE  0.5 mg Oral QHS  .  sodium chloride  3 mL Intravenous Q12H  . Vitamin D (Ergocalciferol)  50,000 Units Oral Q7 days   Continuous Infusions:    Principal Problem:  *Recurrent falls Active Problems:  Seizure  HTN (hypertension)  Atrial fibrillation  Anticoagulant long-term use  Hyponatremia    Time spent:    [facility]  [facility] Pager [phone]. If 8PM-8AM, please contact night-coverage at [email or web address], password [facility] 01/11/2013, 10:14 AM  LOS: 4 days

## 2013-01-11 NOTE — Progress Notes (Signed)
Physical Therapy Treatment Patient Details Name: BENNET KUJAWA MRN: [id number] DOB: Feb 15, 1926 Today's Date: 01/11/2013 Time: 1027-2536 PT Time Calculation (min): 20 min  PT Assessment / Plan / Recommendation Comments on Treatment Session  Pt assisted to EOB and performed upright posture static sitting for 5 minutes to prepare for standing.  Pt able to stand with +2 assist but with strong posterior lean and unable to correct so returned to sitting for safety.  Upon sitting, pt vomited so assisted back to supine once vomiting ended.    Follow Up Recommendations  SNF;Supervision/Assistance - 24 hour     Does the patient have the potential to tolerate intense rehabilitation     Barriers to Discharge        Equipment Recommendations  None recommended by PT    Recommendations for Other Services    Frequency     Plan Discharge plan remains appropriate;Frequency remains appropriate    Precautions / Restrictions Precautions Precautions: Fall Restrictions Weight Bearing Restrictions: No   Pertinent Vitals/Pain RN called to room due to pt vomiting.    Mobility  Bed Mobility Bed Mobility: Supine to Sit;Sit to Supine;Rolling Left;Scooting to [facility];Rolling Right Rolling Right: 1: +2 Total assist Rolling Right: Patient Percentage: 0% (fatiqued) Rolling Left: 1: +2 Total assist Rolling Left: Patient Percentage: 0% (fatiqued) Supine to Sit: 1: +2 Total assist Supine to Sit: Patient Percentage: 50% Sit to Supine: 1: +2 Total assist Sit to Supine: Patient Percentage: 0% Scooting to HOB: 1: +2 Total assist Scooting to [facility]: Patient Percentage: 0% Details for Bed Mobility Assistance: increased time, verbal cues for technique however pt requiring increased assist esp upon return to bed due to fatigue Transfers Transfers: Sit to Stand;Stand to Sit Sit to Stand: 1: +2 Total assist;With upper extremity assist;From bed Sit to Stand: Patient Percentage: 20% (pt initiates standing but has strong  posterior lean) Stand to Sit: 1: +2 Total assist;To bed Stand to Sit: Patient Percentage: 20% Details for Transfer Assistance: pt required increased assist and continues to present with strong posterior lean upon standing and unable to shift weight forward for COG today so had pt return to sitting and pt then vomited (RN into room and pt assist with returning to supine once stopped vomiting)    Exercises     PT Diagnosis:    PT Problem List:   PT Treatment Interventions:     PT Goals Acute Rehab PT Goals PT Goal: Supine/Side to Sit - Progress: Not progressing PT Goal: Sit to Stand - Progress: Not progressing PT Goal: Stand to Sit - Progress: Not progressing Additional Goals PT Goal: Additional Goal #1 - Progress: Progressing toward goal  Visit Information  Last PT Received On: 01/11/13 Assistance Needed: +2    Subjective Data  Subjective: pt vomited upon sitting down   Cognition  Overall Cognitive Status: Impaired Area of Impairment: Following commands;Safety/judgement;Awareness of errors;Awareness of deficits Arousal/Alertness: Awake/alert Orientation Level: Disoriented to;Situation Behavior During Session: [facility] for tasks performed Following Commands: Follows one step commands inconsistently (HOH?) Safety/Judgement: Decreased safety judgement for tasks assessed;Decreased awareness of need for assistance Awareness of Errors: Assistance required to identify errors made Cognition - Other Comments: Pt able to follow commands at times, other times did not ?hearing and/or due to ability (weakness)    Balance  Balance Balance Assessed: Yes Static Sitting Balance Static Sitting - Balance Support: Bilateral upper extremity supported;Feet supported Static Sitting - Level of Assistance: 3: Mod assist Static Sitting - Comment/# of Minutes: variable assist due to  fatigue supervision to mod assist, increased verbal and visual cues for upright posture, 5 minutes, pt fatigues  quickly Dynamic Sitting Balance Dynamic Sitting - Balance Support: Left upper extremity supported;Feet supported Dynamic Sitting - Level of Assistance: 4: Min assist Reach (Patient is able to reach ___ inches to right, left, forward, back): pt able to reach approx 7 in forward with R hand to reach for therapists hand Dynamic Sitting - Balance Activities: Forward lean/weight shifting Dynamic Sitting - Comments: only performed once to test dynamic balance and pt very fatigued  End of Session PT - End of Session Equipment Utilized During Treatment: Gait belt Activity Tolerance: Patient limited by fatigue;Other (comment) (nausea and vomiting) Patient left: in bed;with call bell/phone within reach;with nursing in room   GP     [facility] E 01/11/2013, 3:43 PM  Zenovia Jarred, PT, DPT 01/11/2013 Pager: [phone]

## 2013-01-12 DIAGNOSIS — I2510 Atherosclerotic heart disease of native coronary artery without angina pectoris: Secondary | ICD-10-CM

## 2013-01-12 DIAGNOSIS — J209 Acute bronchitis, unspecified: Principal | ICD-10-CM | POA: Diagnosis present

## 2013-01-12 MED ORDER — LEVALBUTEROL HCL 0.63 MG/3ML IN NEBU
0.6300 mg | INHALATION_SOLUTION | Freq: Four times a day (QID) | RESPIRATORY_TRACT | Status: DC
  Administered 2013-01-13 (×2): 0.63 mg via RESPIRATORY_TRACT
  Filled 2013-01-12 (×5): qty 3

## 2013-01-12 MED ORDER — VITAMINS A & D EX OINT
TOPICAL_OINTMENT | CUTANEOUS | Status: AC
  Administered 2013-01-12: 23:00:00
  Filled 2013-01-12: qty 5

## 2013-01-12 MED ORDER — LEVOFLOXACIN IN D5W 750 MG/150ML IV SOLN
750.0000 mg | INTRAVENOUS | Status: DC
  Administered 2013-01-12: 750 mg via INTRAVENOUS
  Filled 2013-01-12 (×2): qty 150

## 2013-01-12 MED ORDER — LEVALBUTEROL HCL 0.63 MG/3ML IN NEBU
0.6300 mg | INHALATION_SOLUTION | RESPIRATORY_TRACT | Status: DC | PRN
  Filled 2013-01-12: qty 3

## 2013-01-12 MED ORDER — LEVALBUTEROL HCL 0.63 MG/3ML IN NEBU
0.6300 mg | INHALATION_SOLUTION | Freq: Four times a day (QID) | RESPIRATORY_TRACT | Status: DC
  Administered 2013-01-12 (×2): 0.63 mg via RESPIRATORY_TRACT
  Filled 2013-01-12 (×4): qty 3

## 2013-01-12 NOTE — Progress Notes (Addendum)
[facility] PROGRESS NOTE  Joseph Bentley ZOX:[id number] DOB: 1926/01/17 DOA: 01/07/2013 PCP: Ezequiel Kayser, MD  Brief Narrative The pt is an 79 y.o. male who unfortunately has had numerous falls over the past weeks and months. Per family due to chronic weakness it has gotten to the point where he has had 3-4 falls in the past week alone and  his wife is not able to physically get him up after the falls. Thankfully he has not injured himself in the past few falls (although he has broken his arm in the past). Despite multiple ER visits no medical cause for his progressive generalized weakness has been able to be found. He was admitted for further eval and management as well as possible placement.   Assessment/Plan: Principal Problem:  *Recurrent falls-  -unclear if any syncope -  Echo with EF 60-65% and only mild AS - awaiting PT eval, OT recommending SNF - vit B12 wnl, and testosterone level low Active Problems:  Seizure  - continue outpt meds, remains sz free HTN (hypertension)  -continue labetalol Atrial fibrillation -upon admission pt was noted to be on chronic coumadin and with the frequent falls as above- coumadin was discontinued -final anticoagulation decision should be made by PCP and/or cards of course on outpt follow up. -will continue ASA (started after discontinuing coumadin on admission) - rate remains controlled Hyponatremia/dehydration -Na improved with hydration,  urine NA was 41 -pt developed dyspnea an IVF was dc'ed and he was given dose of lasix - he is tolerating po well  Acute bronchitis - CXR on 1/23 - neg, he was on mucolytics - on follow >>increased sputm production>> start on empiric abx and add bronchodilators and follow    Code Status: full  Family Communication:  Disposition Plan: SNF soon if medically stableble on follow up.   Consultants:  none  Procedures:  Echo   Antibiotics:  none  HPI/Subjective: States he feels better this  am, decreased cough but now productive of brownish sputum. Wife at bedside  Objective: Filed Vitals:   01/11/13 2040 01/12/13 0512 01/12/13 0955 01/12/13 0956  BP: 151/74 149/61 148/68   Pulse: 84 81  100  Temp: 98.1 F (36.7 C) 97.7 F (36.5 C)    TempSrc: Oral Oral    Resp: 20     Height:      Weight:      SpO2: 99% 99%      Intake/Output Summary (Last 24 hours) at 01/12/13 1124 Last data filed at 01/12/13 0954  Gross per 24 hour  Intake    720 ml  Output   1350 ml  Net   -630 ml   Filed Weights   01/07/13 2119  Weight: 67.4 kg (148 lb 9.4 oz)    Exam:   General: elderly male in NAD, alert and appropriate  Cardiovascular: RRR, nl S1S2  Respiratory:  scattered wheezes, few rhonchi  Abdomen: Soft +BS, NT/ND  EXT: no edema  Data Reviewed: Basic Metabolic Panel:  Lab 01/11/13 8119 01/10/13 0444 01/09/13 0539 01/08/13 0500 01/07/13 1648  NA 132* 132* 130* 132* 133*  K 4.0 4.0 4.1 4.1 4.6  CL 98 98 98 98 98  CO2 25 25 26 22 23   GLUCOSE 117* 93 144* 105* 129*  BUN 20 23 22  24* 28*  CREATININE 0.89 1.01 1.04 0.95 1.03  CALCIUM 7.6* 7.9* 7.8* 8.0* 8.5  MG -- -- -- -- --  PHOS -- -- -- -- --   Liver Function Tests:  Lab 01/06/13 1200  AST 23  ALT 11  ALKPHOS 127*  BILITOT 0.5  PROT 6.5  ALBUMIN 3.8   No results found for this basename: LIPASE:5,AMYLASE:5 in the last 168 hours No results found for this basename: AMMONIA:5 in the last 168 hours CBC:  Lab 01/08/13 0500 01/07/13 1648 01/06/13 1200  WBC 5.8 7.4 7.2  NEUTROABS -- 5.9 5.8  HGB 14.5 15.7 15.7  HCT 42.4 45.4 46.9  MCV 95.7 96.2 97.3  PLT 130* 135* 161   Cardiac Enzymes:  Lab 01/07/13 1648 01/06/13 1200  CKTOTAL -- --  CKMB -- --  CKMBINDEX -- --  TROPONINI <0.30 <0.30   BNP (last 3 results) No results found for this basename: PROBNP:3 in the last 8760 hours CBG: No results found for this basename: GLUCAP:5 in the last 168 hours  Recent Results (from the past 240 hour(s))    URINE CULTURE     Status: Normal   Collection Time   01/07/13  7:03 PM      Component Value Range Status Comment   Specimen Description URINE, CLEAN CATCH   Final    Special Requests NONE   Final    Culture  Setup Time 01/08/2013 02:17   Final    Colony Count 3,000 COLONIES/ML   Final    Culture INSIGNIFICANT GROWTH   Final    Report Status 01/09/2013 FINAL   Final      Studies: No results found.  Scheduled Meds:    . aspirin EC  325 mg Oral Daily  . cyanocobalamin  1,000 mcg Intramuscular Q30 days  . docusate sodium  100 mg Oral QHS  . escitalopram  10 mg Oral Daily  . feeding supplement  237 mL Oral BID  . heparin  5,000 Units Subcutaneous Q8H  . hydrALAZINE  100 mg Oral TID  . irbesartan  75 mg Oral Daily  . labetalol  200 mg Oral TID  . levalbuterol  0.63 mg Nebulization Q6H  . levETIRAcetam  500 mg Oral Q12H  . levofloxacin (LEVAQUIN) IV  750 mg Intravenous Q24H  . phenytoin  200 mg Oral BID  . risperiDONE  0.5 mg Oral QHS  . sodium chloride  3 mL Intravenous Q12H  . Vitamin D (Ergocalciferol)  50,000 Units Oral Q7 days   Continuous Infusions:    Principal Problem:  *Recurrent falls Active Problems:  Seizure  HTN (hypertension)  Atrial fibrillation  Anticoagulant long-term use  Hyponatremia    Time spent:    [facility]  [facility] Pager [phone]. If 8PM-8AM, please contact night-coverage at [email or web address], password [facility] 01/12/2013, 11:24 AM  LOS: 5 days

## 2013-01-12 NOTE — Progress Notes (Signed)
Patient for [facility] upon discharge.  Edit Ricciardelli C. Maylen Waltermire MSW, LCSW [phone]

## 2013-01-13 LAB — CBC
HCT: 41.5 % (ref 39.0–52.0)
Hemoglobin: 14.3 g/dL (ref 13.0–17.0)
MCH: 32.5 pg (ref 26.0–34.0)
MCV: 94.3 fL (ref 78.0–100.0)
RBC: 4.4 MIL/uL (ref 4.22–5.81)
WBC: 4.2 10*3/uL (ref 4.0–10.5)

## 2013-01-13 MED ORDER — LEVOFLOXACIN 750 MG PO TABS
750.0000 mg | ORAL_TABLET | Freq: Every day | ORAL | Status: DC
  Administered 2013-01-13: 750 mg via ORAL
  Filled 2013-01-13: qty 1

## 2013-01-13 MED ORDER — PANTOPRAZOLE SODIUM 40 MG PO TBEC: 40.0000 mg | DELAYED_RELEASE_TABLET | Freq: Two times a day (BID) | ORAL | Status: DC

## 2013-01-13 MED ORDER — ENSURE COMPLETE PO LIQD: 237.0000 mL | Freq: Two times a day (BID) | ORAL | Status: DC

## 2013-01-13 MED ORDER — LEVALBUTEROL HCL 0.63 MG/3ML IN NEBU: 0.6300 mg | INHALATION_SOLUTION | RESPIRATORY_TRACT | Status: DC | PRN

## 2013-01-13 MED ORDER — LEVOFLOXACIN 750 MG PO TABS: 750.0000 mg | ORAL_TABLET | Freq: Every day | ORAL | Status: DC

## 2013-01-13 MED ORDER — ASPIRIN 325 MG PO TBEC: 325.0000 mg | DELAYED_RELEASE_TABLET | Freq: Every day | ORAL | Status: DC

## 2013-01-13 NOTE — Evaluation (Signed)
Clinical/Bedside Swallow Evaluation Patient Details  Name: Joseph Bentley MRN: [id number] Date of Birth: Aug 21, 1926  Today's Date: 01/13/2013 Time: 1009-1050 SLP Time Calculation (min): 41 min  Past Medical History:  Past Medical History  Diagnosis Date  . Hypertension   . Epilepsy   . CAD (coronary artery disease)   . Hx of CABG   . Stroke   . B12 deficiency   . Low testosterone   . BPH (benign prostatic hyperplasia)   . A-fib   . Fall   . Seizures   . Hyperlipidemia   . Chronic anticoagulation    Past Surgical History:  Past Surgical History  Procedure Date  . Back surgery 05/2011    cement fusion of broken vertebrae  . Cardiac surgery   . Hernia repair   . Coronary artery bypass graft   . Cardiac catheterization 02/11/2007    EF 55-60%  . Cardiac catheterization 01/04/2005    EF 55%  . Angioplasty 1987  . Hernia repair   . Pilonidal cyst excision   . US echocardiography 01/18/2009    EF 55-60%  . US echocardiography 03/11/2003    EF 55-60%  . Cardiovascular stress test 11/05/2004    NO EVIDENCE OF ISCHEMIA   HPI:  79 yo male adm to [facility] after having multiple falls, progressive weakness in the last weeks to months.  PMH + for HTN, CVA (approx 1.5 years ago per pt), hyponatremia, anticoagulant, HLD, B12 deficiency, seizures, CAD s/p CABG.  Pt medication list includes protonix (x3-4 years per pt), keppra, dilantin, risperdal.  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 accessory nerve involvement.  Pt appeared with tremors when feeding self - which he states have been present for 7-8 years.  Pt denies severe problems swallowing but does admit to occasional sensation of food and pills lodging in distal esophagus, he states this will normally clear with sips of liquids.     Assessment / Plan / Recommendation Clinical Impression  Pt presents with a functional oropharyngeal swallow from observation at bedside.  No clinical indicators of aspiration with  cracker, applesauce, cranberry juice, or water.  SLP also observed pt consuming medications given by RN with good tolerance.  Pt eats at rapid rate and slp advised him to slow rate and take small bites to maximize airway protection.  Pt pointed to distal esophagus to indicate area of pill lodging yesterday - which he stated cleared with further boluses of water.   Advised him to monitor his esophageal function/swallowing closely.   Advised pt to call Dr Waynard Edwards if problems worsen with distal esophageal stasis sensation- which he can not clear with liquids, or he has recurrent pulm infections, weight loss, etc.  He verbalized understanding.   Xerostomia also reported by pt - SLP reviewed reflux precautions (given pt diagnosis), compensations for dysphagia and xerostomia and in writing.    Spoke to son Joseph Bentley over the phone after the evaluation and reviewed all information.  No further slp indicated as all education completed and suspect primary esophageal issue.  Thanks for the consult.     Aspiration Risk  Mild    Diet Recommendation Regular;Thin liquid   Liquid Administration via: Cup Medication Administration: Whole meds with liquid Supervision: Patient able to self feed Compensations: Slow rate;Small sips/bites Postural Changes and/or Swallow Maneuvers: Seated upright 90 degrees;Upright 30-60 min after meal    Other  Recommendations Oral Care Recommendations: Oral care before and after PO  Follow Up Recommendations  None    Frequency and Duration        Pertinent Vitals/Pain Afebrile, decreased    SLP Swallow Goals  n/a   Swallow Study Prior Functional Status   fed self, regular/thin    General Date of Onset: 01/13/13 HPI: 79 yo male adm to [facility] after having multiple falls, progressive weakness in the last weeks to months.  PMH + for HTN, CVA (approx 1.5 years ago per pt), hyponatremia, anticoagulant, HLD, B12 deficiency, seizures, CAD s/p CABG.  Pt medication list includes protonix  (x3-4 years per pt), keppra, dilantin, risperdal.  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 accessory nerve involvement.  Pt appeared with tremors when feeding self - which he states have been present for 7-8 years.  Pt denies severe problems swallowing but does admit to occasional sensation of food and pills lodging in distal esophagus, he states this will normally clear with sips of liquids.   Type of Study: Bedside swallow evaluation Diet Prior to this Study: Regular;Thin liquids (heart healthy) Temperature Spikes Noted: No Respiratory Status: Supplemental O2 delivered via (comment) Behavior/Cognition: Alert;Cooperative;Hard of hearing Oral Cavity - Dentition: Adequate natural dentition Self-Feeding Abilities: Able to feed self Patient Positioning: Upright in bed Baseline Vocal Quality: Clear Volitional Cough: Strong Volitional Swallow: Able to elicit    Oral/Motor/Sensory Function Overall Oral Motor/Sensory Function: Appears within functional limits for tasks assessed   Ice Chips Ice chips: Not tested   Thin Liquid Thin Liquid: Within functional limits Presentation: Straw;Self Fed    Nectar Thick Nectar Thick Liquid: Not tested   Honey Thick Honey Thick Liquid: Not tested   Puree Puree: Within functional limits Presentation: Self Fed;Spoon   Solid   GO    Solid: Within functional limits Presentation: Self Fed Other Comments: pt eats at rapid rate, encouraged pt's son to encourage him to slow rate       Donavan Burnet, MS [facility] SLP [phone]

## 2013-01-13 NOTE — Progress Notes (Signed)
Patient cleared for discharge to [facility] home. Packet copied and placed in [place]. ptar called for transportation. Patient spouse aware and agreeable.  Yancy Hascall C. Jarious Lyon MSW, LCSW [phone]

## 2013-01-13 NOTE — Progress Notes (Signed)
Clinical Social Work Department CLINICAL SOCIAL WORK PLACEMENT NOTE 01/13/2013  Patient:  LORENE, SAMAAN  Account Number:  [id number] Admit date:  01/07/2013  Clinical Social Worker:  Becky Sax, LCSW  Date/time:  01/13/2013 12:00 M  Clinical Social Work is seeking post-discharge placement for this patient at the following level of care:   SKILLED NURSING   (*CSW will update this form in Epic as items are completed)   01/13/2013  Patient/family provided with [facility] Department of Clinical Social Work's list of facilities offering this level of care within the geographic area requested by the patient (or if unable, by the patient's family).  01/13/2013  Patient/family informed of their freedom to choose among providers that offer the needed level of care, that participate in Medicare, Medicaid or managed care program needed by the patient, have an available bed and are willing to accept the patient.  01/13/2013  Patient/family informed of [facility]' ownership interest in [facility], as well as of the fact that they are under no obligation to receive care at this facility.  PASARR submitted to EDS on 01/13/2013 PASARR number received from EDS on 01/13/2013  FL2 transmitted to all facilities in geographic area requested by pt/family on  01/13/2013 FL2 transmitted to all facilities within larger geographic area on 01/13/2013  Patient informed that his/her managed care company has contracts with or will negotiate with  certain facilities, including the following:     Patient/family informed of bed offers received:  01/13/2013 Patient chooses bed at [facility] Physician recommends and patient chooses bed at  [facility]  Patient to be transferred to [facility] on  01/13/2013 Patient to be transferred to facility by ptar  The following physician request were entered in Epic:   Additional Comments:

## 2013-01-13 NOTE — Discharge Summary (Signed)
[facility]                                                                                   Joseph Bentley, is a 79 y.o. male  DOB 08-18-1926  MRN [id number].  Admission date:  01/07/2013  Discharge Date:  01/13/2013  Primary MD  Ezequiel Kayser, MD  Admitting Physician  Hillary Bow, DO  Admission Diagnosis  Atrial fibrillation [427.31] Syncope [780.2] Anticoagulant long-term use [V58.61] Recurrent falls [V15.88] ALTERED MENTAL STATUS, LOC  Discharge Diagnosis     Principal Problem:  *Recurrent falls Active Problems:  Seizure  HTN (hypertension)  Atrial fibrillation  Anticoagulant long-term use  Hyponatremia  Bronchitis, acute     Past Medical History  Diagnosis Date  . Hypertension   . Epilepsy   . CAD (coronary artery disease)   . Hx of CABG   . Stroke   . B12 deficiency   . Low testosterone   . BPH (benign prostatic hyperplasia)   . A-fib   . Fall   . Seizures   . Hyperlipidemia   . Chronic anticoagulation     Past Surgical History  Procedure Date  . Back surgery 05/2011    cement fusion of broken vertebrae  . Cardiac surgery   . Hernia repair   . Coronary artery bypass graft   . Cardiac catheterization 02/11/2007    EF 55-60%  . Cardiac catheterization 01/04/2005    EF 55%  . Angioplasty 1987  . Hernia repair   . Pilonidal cyst excision   . US echocardiography 01/18/2009    EF 55-60%  . US echocardiography 03/11/2003    EF 55-60%  . Cardiovascular stress test 11/05/2004    NO EVIDENCE OF ISCHEMIA        Discharge Diagnoses:   Principal Problem:  *Recurrent falls Active Problems:  Seizure  HTN (hypertension)  Atrial fibrillation  Anticoagulant long-term use  Hyponatremia  Bronchitis, acute    Discharge Condition: Stable   Diet recommendation: See Discharge Instructions below   Consults - speech, PT, respiratory therapy for chest PT   History of present illness and  Hospital Course:  See H&P, Labs,  Consult and Test reports for all details in brief, patient was admitted for generalized weakness and deconditioning with multiple falls at home, family unable to take care of the patient at home. Workup here unremarkable including CT of the brain along with MRI of the brain, echo stable with EF of 60-65% and mild AAS, normal vitamin B 12 level, patient will require continued physical therapy and balance exercises for improving balance and to reduce her risk of falls.   Patient has history of seizures remained seizure-free home seizure medications will be continued.   History of atrial fibrillation and hypertension. Continue home medications however at this time due to his recurrent falls and poor balance Coumadin will be stopped and he'll be switched to aspirin.   He was also diagnosed with acute bronchitis, at this time 5 more days of Levaquin will be continued, he is afebrile with no leukocytosis, chest x-ray stable, will recommend to continue feeding assistance and aspiration precautions, he was cleared by speech  here today. Will require continued chest physical therapy and pulmonary toiletry twice a day.      Today   Subjective:   [name] today has no headache,no chest abdominal pain,no new weakness tingling or numbness, feels much better.  Objective:   Blood pressure 150/81, pulse 103, temperature 98.1 F (36.7 C), temperature source Oral, resp. rate 20, height 5\' 9"  (1.753 m), weight 67.4 kg (148 lb 9.4 oz), SpO2 98.00%.   Intake/Output Summary (Last 24 hours) at 01/13/13 1138 Last data filed at 01/13/13 0702  Gross per 24 hour  Intake    360 ml  Output   1100 ml  Net   -740 ml    Exam Awake Alert,  No new F.N deficits, Normal affect [place].AT,PERRAL Supple Neck,No JVD, No cervical lymphadenopathy appriciated.  Symmetrical Chest wall movement, Good air movement bilaterally, coarse breath sounds bilaterally RRR,No Gallops,Rubs or new Murmurs, No Parasternal Heave +ve  B.Sounds, Abd Soft, Non tender, No organomegaly appriciated, No rebound -guarding or rigidity. No Cyanosis, Clubbing or edema, No new Rash or bruise  Data Review   Major procedures and Radiology Reports - PLEASE review detailed and final reports for all details in brief -       Dg Chest 2 View  01/07/2013  *RADIOLOGY REPORT*  Clinical Data: Cough, weakness.  CHEST - 2 VIEW  Comparison: 01/06/2013  Findings: Mild cardiomegaly.  Prior CABG.  Bibasilar opacities, likely atelectasis.  No effusions.  No acute bony abnormality.  No real change.  IMPRESSION: Cardiomegaly, low volumes with bibasilar atelectasis.  No real change.   Original Report Authenticated By: Charlett Nose, M.D.    Dg Chest 2 View  01/06/2013  *RADIOLOGY REPORT*  Clinical Data: Weakness, hypertension.  CHEST - 2 VIEW  Comparison: 01/03/2013  Findings: Prior CABG.  Mild cardiomegaly.  Low lung volumes with vascular congestion and bibasilar atelectasis.  No effusions.  No acute bony abnormality.  IMPRESSION: Cardiomegaly with vascular congestion, low lung volumes and bibasilar atelectasis.   Original Report Authenticated By: Charlett Nose, M.D.    Dg Chest 2 View  01/03/2013  *RADIOLOGY REPORT*  Clinical Data: Altered mental status.  CHEST - 2 VIEW  Comparison: 12/09/2011.  Findings: The cardiac silhouette, mediastinal and hilar contours are within normal limits and stable.  Stable surgical changes from bypass surgery.  Mild chronic bronchitic type interstitial lung changes but no acute pulmonary findings.  The bony thorax is intact.  Remote compression fractures are noted in the lower thoracic spine.  IMPRESSION: No acute cardiopulmonary findings.   Original Report Authenticated By: Rudie Meyer, M.D.    Ct Head Wo Contrast  01/07/2013  *RADIOLOGY REPORT*  Clinical Data: Weakness.  CT HEAD WITHOUT CONTRAST  Technique:  Contiguous axial images were obtained from the base of the skull through the vertex without contrast.  Comparison:  01/06/2013  Findings: There is atrophy and chronic small vessel disease changes. No acute intracranial abnormality.  Specifically, no hemorrhage, hydrocephalus, mass lesion, acute infarction, or significant intracranial injury.  No acute calvarial abnormality. The mastoids are clear.  Mucosal thickening in the ethmoid air cells.  IMPRESSION: No acute intracranial abnormality.  Atrophy, chronic microvascular disease.   Original Report Authenticated By: Charlett Nose, M.D.    Ct Head Wo Contrast  01/06/2013  *RADIOLOGY REPORT*  Clinical Data: Weakness  CT HEAD WITHOUT CONTRAST  Technique:  Contiguous axial images were obtained from the base of the skull through the vertex without contrast.  Comparison: CT 01/03/2013, MRI 12/09/2011  Findings:  Hypodensity left pons and midbrain are more prominent than on the prior study.  This could be acute or chronic ischemia. Correlate with clinical findings and consider follow-up MRI.  Generalized atrophy.  Negative for hemorrhage or mass.  Mild chronic microvascular ischemia in the white matter.  No midline shift.  Calvarium is intact.  IMPRESSION: Negative for hemorrhage or mass.  Hypodensity left mid brain and pons may represent acute or chronic ischemia.  Consider follow-up MRI if this correlates with the patient's weakness.   Original Report Authenticated By: Janeece Riggers, M.D.    Ct Head Wo Contrast  01/03/2013  *RADIOLOGY REPORT*  Clinical Data: Altered mental status.  CT HEAD WITHOUT CONTRAST  Technique:  Contiguous axial images were obtained from the base of the skull through the vertex without contrast.  Comparison: 12/08/2011.  Findings: Stable age related cerebral atrophy, ventriculomegaly and periventricular white matter disease.  No extra-axial fluid collections are identified.  No CT findings for acute hemispheric infarction and/or intracranial hemorrhage.  No mass lesions.  The brainstem and cerebellum grossly normal and stable.  The bony structures are intact.   The paranasal sinuses and mastoid air cells are clear except for scattered ethmoid sinus disease.  IMPRESSION:  1.  Stable atrophy, ventriculomegaly and periventricular white matter disease. 2.  No acute intracranial findings for mass lesions.   Original Report Authenticated By: Rudie Meyer, M.D.    Mr Brain Wo Contrast  01/06/2013  *RADIOLOGY REPORT*  Clinical Data: 79 year old male with increasing weakness, unsteady gait, and speech changes times 1 week.  Fall.  MRI HEAD WITHOUT CONTRAST  Technique:  Multiplanar, multiecho pulse sequences of the brain and surrounding structures were obtained according to standard protocol without intravenous contrast.  Comparison: Head CT 01/06/2013.  Brain MRI 12/09/2011.  Findings: Mildly heterogeneous diffusion signal, but No restricted diffusion to suggest acute infarction.  Stable cerebral volume. No midline shift, ventriculomegaly, mass effect, evidence of mass lesion, extra-axial collection or acute intracranial hemorrhage.  Cervicomedullary junction and pituitary are within normal limits.  Major intracranial vascular flow voids are stable.  Patchy cerebral white matter and pontine T2 and FLAIR hyperintensity has mildly progressed since 2012.  No areas of cortical encephalomalacia are identified.  Occasional chronic micro hemorrhages in the brain, including the left thalamus, are stable. Tiny chronic lacunar infarct in the left cerebellar tonsil may be new.  Stable visualized cervical spine.  Normal bone marrow signal. Interval postoperative changes to the orbits.  Stable paranasal sinuses with mild mucosal thickening.  Mastoids are clear. Negative scalp soft tissues.  IMPRESSION: 1. No acute intracranial abnormality. 2.  Signal changes compatible with chronic small vessel disease, mildly progressed since 2012.   Original Report Authenticated By: Erskine Speed, M.D.     Micro Results      Recent Results (from the past 240 hour(s))  URINE CULTURE     Status: Normal    Collection Time   01/07/13  7:03 PM      Component Value Range Status Comment   Specimen Description URINE, CLEAN CATCH   Final    Special Requests NONE   Final    Culture  Setup Time 01/08/2013 02:17   Final    Colony Count 3,000 COLONIES/ML   Final    Culture INSIGNIFICANT GROWTH   Final    Report Status 01/09/2013 FINAL   Final      CBC w Diff: Lab Results  Component Value Date   WBC 4.2 01/13/2013   WBC 6.0 10/22/2010  HGB 14.3 01/13/2013   HGB 17.3* 10/22/2010   HCT 41.5 01/13/2013   HCT 51.1* 10/22/2010   PLT 157 01/13/2013   PLT 173 10/22/2010   LYMPHOPCT 7* 01/07/2013   LYMPHOPCT 12.1* 10/22/2010   MONOPCT 13* 01/07/2013   MONOPCT 10.7 10/22/2010   EOSPCT 0 01/07/2013   EOSPCT 1.5 10/22/2010   BASOPCT 0 01/07/2013   BASOPCT 0.6 10/22/2010    CMP: Lab Results  Component Value Date   NA 132* 01/11/2013   K 4.0 01/11/2013   CL 98 01/11/2013   CO2 25 01/11/2013   BUN 20 01/11/2013   CREATININE 0.89 01/11/2013   CREATININE 0.83 05/01/2009   PROT 6.5 01/06/2013   ALBUMIN 3.8 01/06/2013   BILITOT 0.5 01/06/2013   ALKPHOS 127* 01/06/2013   AST 23 01/06/2013   ALT 11 01/06/2013  .   Discharge Instructions     Follow with Primary MD Rodrigo Ran A, MD in 3 days   Get CBC, CMP, checked 3 days by Primary MD and again as instructed by your Primary MD. Get a 2 view Chest X ray done next visit.  Get Medicines reviewed and adjusted.  Please request your Prim.MD to go over all Hospital Tests and Procedure/Radiological results at the follow up, please get all Hospital records sent to your Prim MD by signing hospital release before you go home.  Activity: As tolerated with Full fall precautions use walker/cane & assistance as needed   Diet:  Heart healthy,  Fluid restriction 1.8 lit/day, feed with assistance, head of the bed up at 90 and with full Aspiration precautions.  For Heart failure patients - Check your Weight same time everyday, if you gain over 2 pounds, or you develop in leg  swelling, experience more shortness of breath or chest pain, call your Primary MD immediately. Follow Cardiac Low Salt Diet and 1.8 lit/day fluid restriction.  Disposition SNF  If you experience worsening of your admission symptoms, develop shortness of breath, life threatening emergency, suicidal or homicidal thoughts you must seek medical attention immediately by calling 911 or calling your MD immediately  if symptoms less severe.  You Must read complete instructions/literature along with all the possible adverse reactions/side effects for all the Medicines you take and that have been prescribed to you. Take any new Medicines after you have completely understood and accpet all the possible adverse reactions/side effects.   Follow-up Information    Follow up with PERINI,MARK A, MD. Schedule an appointment as soon as possible for a visit in 3 days.   Contact information:   [address] [place] [address] [phone]            Discharge Medications     Medication List     As of 01/13/2013 11:38 AM    START taking these medications         aspirin 325 MG EC tablet   Take 1 tablet (325 mg total) by mouth daily.      feeding supplement Liqd   Take 237 mLs by mouth 2 (two) times daily.      levalbuterol 0.63 MG/3ML nebulizer solution   Commonly known as: XOPENEX   Take 3 mLs (0.63 mg total) by nebulization every 3 (three) hours as needed for wheezing or shortness of breath.      levofloxacin 750 MG tablet   Commonly known as: LEVAQUIN   Take 1 tablet (750 mg total) by mouth daily. 5 more days      CHANGE how you  take these medications         pantoprazole 40 MG tablet   Commonly known as: PROTONIX   Take 1 tablet (40 mg total) by mouth 2 (two) times daily. For acid reflux   What changed: - how often to take the med - reasons to take the med      CONTINUE taking these medications         cyanocobalamin 1000 MCG/ML injection   Commonly known as: (VITAMIN B-12)       docusate sodium 100 MG capsule   Commonly known as: COLACE      escitalopram 10 MG tablet   Commonly known as: LEXAPRO      hydrALAZINE 100 MG tablet   Commonly known as: APRESOLINE      labetalol 200 MG tablet   Commonly known as: NORMODYNE   Take 1 tablet (200 mg total) by mouth 3 (three) times daily.      levETIRAcetam 500 MG tablet   Commonly known as: KEPPRA      phenytoin 100 MG ER capsule   Commonly known as: DILANTIN      RISPERDAL 0.5 MG tablet   Generic drug: risperiDONE      testosterone cypionate 200 MG/ML injection   Commonly known as: DEPOTESTOTERONE CYPIONATE      valsartan 80 MG tablet   Commonly known as: DIOVAN      Vitamin D (Ergocalciferol) 50000 UNITS Caps   Commonly known as: DRISDOL      STOP taking these medications         warfarin 5 MG tablet   Commonly known as: COUMADIN          Where to get your medications       Information on where to get these meds is not yet available. Ask your nurse or doctor.         aspirin 325 MG EC tablet   feeding supplement Liqd   levalbuterol 0.63 MG/3ML nebulizer solution   levofloxacin 750 MG tablet   pantoprazole 40 MG tablet               Total Time in preparing paper work, data evaluation and todays exam - 35 minutes  Leroy Sea M.D on 01/13/2013 at 11:38 AM  [facility] Office  [phone]

## 2013-01-13 NOTE — Progress Notes (Signed)
Pt coughing up blood tinged sputum. Dr. Suanne Marker notified.no new orders received. Will continue to monitor.

## 2013-02-05 ENCOUNTER — Ambulatory Visit (HOSPITAL_COMMUNITY): Payer: Medicare Other | Admitting: Psychiatry

## 2013-03-08 ENCOUNTER — Other Ambulatory Visit: Payer: Self-pay | Admitting: Cardiology

## 2013-07-03 ENCOUNTER — Other Ambulatory Visit: Payer: Self-pay | Admitting: Cardiology

## 2013-07-06 ENCOUNTER — Telehealth: Payer: Self-pay | Admitting: Cardiology

## 2013-07-06 MED ORDER — VALSARTAN 80 MG PO TABS: 80.0000 mg | ORAL_TABLET | Freq: Every morning | ORAL | Status: DC

## 2013-07-06 MED ORDER — PANTOPRAZOLE SODIUM 40 MG PO TBEC: 40.0000 mg | DELAYED_RELEASE_TABLET | Freq: Two times a day (BID) | ORAL | Status: DC

## 2013-07-06 MED ORDER — LABETALOL HCL 200 MG PO TABS: 200.0000 mg | ORAL_TABLET | Freq: Three times a day (TID) | ORAL | Status: DC

## 2013-07-06 NOTE — Telephone Encounter (Signed)
New Prob    Pts son would like to speak to nurse regarding pts LABETALOL prescription. States pharmacy said he needs an OV before filling. Please call.
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
## 2013-07-28 ENCOUNTER — Ambulatory Visit (INDEPENDENT_AMBULATORY_CARE_PROVIDER_SITE_OTHER): Payer: Medicare Other | Admitting: Cardiology

## 2013-07-28 ENCOUNTER — Encounter: Payer: Self-pay | Admitting: Cardiology

## 2013-07-28 VITALS — BP 142/68 | HR 81 | Ht 69.0 in | Wt 151.8 lb

## 2013-07-28 DIAGNOSIS — I2510 Atherosclerotic heart disease of native coronary artery without angina pectoris: Secondary | ICD-10-CM

## 2013-07-28 DIAGNOSIS — I4891 Unspecified atrial fibrillation: Secondary | ICD-10-CM

## 2013-07-28 DIAGNOSIS — Z7901 Long term (current) use of anticoagulants: Secondary | ICD-10-CM

## 2013-07-28 DIAGNOSIS — I10 Essential (primary) hypertension: Secondary | ICD-10-CM

## 2013-07-28 LAB — CBC WITH DIFFERENTIAL/PLATELET
Basophils Absolute: 0 10*3/uL (ref 0.0–0.1)
Eosinophils Absolute: 0.4 10*3/uL (ref 0.0–0.7)
Lymphocytes Relative: 16.5 % (ref 12.0–46.0)
MCHC: 33.6 g/dL (ref 30.0–36.0)
MCV: 95.3 fl (ref 78.0–100.0)
Monocytes Absolute: 0.6 10*3/uL (ref 0.1–1.0)
Neutrophils Relative %: 65.2 % (ref 43.0–77.0)
Platelets: 216 10*3/uL (ref 150.0–400.0)
RDW: 13.3 % (ref 11.5–14.6)

## 2013-07-28 LAB — BASIC METABOLIC PANEL
BUN: 26 mg/dL — ABNORMAL HIGH (ref 6–23)
CO2: 27 mEq/L (ref 19–32)
Calcium: 8.9 mg/dL (ref 8.4–10.5)
Chloride: 98 mEq/L (ref 96–112)
Creatinine, Ser: 1.1 mg/dL (ref 0.4–1.5)

## 2013-07-28 MED ORDER — APIXABAN 5 MG PO TABS: 5.0000 mg | ORAL_TABLET | Freq: Two times a day (BID) | ORAL | Status: DC

## 2013-07-28 NOTE — Patient Instructions (Addendum)
We will stop ASA  We will switch to an anticoagulant- will have our pharmacist review.  Continue your other therapy  We will arrange follow up with primary care and I will see you in 6 months.
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
## 2013-08-03 ENCOUNTER — Other Ambulatory Visit: Payer: Self-pay

## 2013-08-03 MED ORDER — WARFARIN SODIUM 5 MG PO TABS: 5.0000 mg | ORAL_TABLET | Freq: Every day | ORAL | Status: DC

## 2013-08-06 ENCOUNTER — Encounter: Payer: Self-pay | Admitting: *Deleted

## 2013-08-13 ENCOUNTER — Ambulatory Visit (INDEPENDENT_AMBULATORY_CARE_PROVIDER_SITE_OTHER): Payer: Medicare Other | Admitting: General Practice

## 2013-08-13 DIAGNOSIS — Z7901 Long term (current) use of anticoagulants: Secondary | ICD-10-CM

## 2013-08-13 DIAGNOSIS — I4891 Unspecified atrial fibrillation: Secondary | ICD-10-CM

## 2013-08-13 DIAGNOSIS — I2510 Atherosclerotic heart disease of native coronary artery without angina pectoris: Secondary | ICD-10-CM

## 2013-08-13 DIAGNOSIS — I10 Essential (primary) hypertension: Secondary | ICD-10-CM

## 2013-08-13 LAB — POCT INR: INR: 1.4

## 2013-08-20 ENCOUNTER — Ambulatory Visit (INDEPENDENT_AMBULATORY_CARE_PROVIDER_SITE_OTHER): Payer: Medicare Other | Admitting: Internal Medicine

## 2013-08-20 ENCOUNTER — Encounter: Payer: Self-pay | Admitting: Internal Medicine

## 2013-08-20 ENCOUNTER — Ambulatory Visit (INDEPENDENT_AMBULATORY_CARE_PROVIDER_SITE_OTHER): Payer: Medicare Other | Admitting: General Practice

## 2013-08-20 VITALS — BP 118/80 | HR 119 | Temp 97.4°F | Wt 149.2 lb

## 2013-08-20 DIAGNOSIS — E538 Deficiency of other specified B group vitamins: Secondary | ICD-10-CM | POA: Insufficient documentation

## 2013-08-20 DIAGNOSIS — I2510 Atherosclerotic heart disease of native coronary artery without angina pectoris: Secondary | ICD-10-CM

## 2013-08-20 DIAGNOSIS — I4891 Unspecified atrial fibrillation: Secondary | ICD-10-CM

## 2013-08-20 DIAGNOSIS — K649 Unspecified hemorrhoids: Secondary | ICD-10-CM | POA: Insufficient documentation

## 2013-08-20 DIAGNOSIS — R296 Repeated falls: Secondary | ICD-10-CM

## 2013-08-20 DIAGNOSIS — Z9181 History of falling: Secondary | ICD-10-CM

## 2013-08-20 DIAGNOSIS — Z7901 Long term (current) use of anticoagulants: Secondary | ICD-10-CM

## 2013-08-20 DIAGNOSIS — E291 Testicular hypofunction: Secondary | ICD-10-CM

## 2013-08-20 DIAGNOSIS — I10 Essential (primary) hypertension: Secondary | ICD-10-CM

## 2013-08-20 DIAGNOSIS — E349 Endocrine disorder, unspecified: Secondary | ICD-10-CM | POA: Insufficient documentation

## 2013-08-20 DIAGNOSIS — Z23 Encounter for immunization: Secondary | ICD-10-CM

## 2013-08-20 DIAGNOSIS — E785 Hyperlipidemia, unspecified: Secondary | ICD-10-CM | POA: Insufficient documentation

## 2013-08-20 DIAGNOSIS — R569 Unspecified convulsions: Secondary | ICD-10-CM

## 2013-08-20 HISTORY — DX: Unspecified hemorrhoids: K64.9

## 2013-08-20 HISTORY — DX: Hyperlipidemia, unspecified: E78.5

## 2013-08-20 HISTORY — DX: Endocrine disorder, unspecified: E34.9

## 2013-08-20 LAB — POCT INR: INR: 2.3

## 2013-08-20 MED ORDER — CYANOCOBALAMIN 1000 MCG/ML IJ SOLN: 1000.0000 ug | Freq: Once | INTRAMUSCULAR | Status: DC

## 2013-08-20 MED ORDER — TESTOSTERONE CYPIONATE 200 MG/ML IM SOLN: 300.0000 mg | INTRAMUSCULAR | Status: DC

## 2013-08-20 MED ORDER — HYDROCORTISONE ACETATE 25 MG RE SUPP: 25.0000 mg | Freq: Two times a day (BID) | RECTAL | Status: DC

## 2013-08-20 MED ORDER — CYANOCOBALAMIN 1000 MCG/ML IJ SOLN
1000.0000 ug | Freq: Once | INTRAMUSCULAR | Status: AC
  Administered 2013-08-20: 1000 ug via INTRAMUSCULAR

## 2013-08-20 NOTE — Assessment & Plan Note (Signed)
Will check lipids today Statin intolerant

## 2013-08-20 NOTE — Assessment & Plan Note (Signed)
Pt thinks this is related to cholesterol medication which has been stopped He is working with PT on increasing his strength

## 2013-08-20 NOTE — Assessment & Plan Note (Signed)
Coumadin clinic today

## 2013-08-20 NOTE — Assessment & Plan Note (Signed)
Was on asa Recently switched to coumadin

## 2013-08-20 NOTE — Assessment & Plan Note (Signed)
eRx for anusol suppositories

## 2013-08-20 NOTE — Patient Instructions (Signed)
Warfarin, Questions and Answers  Warfarin (Coumadin) is a prescription medicine that delays normal blood clotting. This is called an "anticoagulant" or "blood thinner." This medicine does not actually cause the blood to become less thick, only less likely to clot.  Normally, when body tissues are cut or damaged, the blood clots in order to prevent blood loss. Some clots form when they are not supposed to and may travel through the bloodstream and become lodged in smaller blood vessels. Blockage of blood flow can cause serious problems including a stroke (when a blood vessel leading to a portion of the brain is blocked) or a pulmonary embolus (where a blood clot in the leg travels to and lodges in the pulmonary arteries, causing blockage of blood flow to the lungs).  WHO SHOULD USE WARFARIN?   Warfarin is prescribed for individuals who have a risk for developing harmful blood clots. People at risk include individuals with a mechanical heart valve, individuals with the irregular heart rhythm called atrial fibrillation, and individuals with certain clotting disorders.   Warfarin is also used in individuals who have a history of developing harmful clots, including individuals who have had a stroke, a clot which has traveled to the lung (pulmonary embolism), or a blood clot in the leg (deep venous thrombosis, DVT).   Warfarin may be used to prevent the growth of an existing clot, such as a DVT.  HOW IS THE EFFECT OF WARFARIN MONITORED?  The goal of warfarin therapy is to lessen the clotting tendency of blood, but not to prevent clotting completely. Therefore, the anticoagulation effect of warfarin must be carefully watched using laboratory blood tests.   The test most commonly used to measure the effects of warfarin is the prothrombin time (protime, PT).   The PT is also used to compute a value known as the International Normalized Ratio (INR).    The longer it takes the blood to clot, the higher the PT and INR. Your caregiver will tell you what your "target" INR range is. In most cases, the target range will be 2 to 3, although other ranges may be chosen. If, at any time, your INR is below the target range, there is a risk of clotting. If your INR is above the target range, there is an increased risk of bleeding.  When warfarin is first prescribed, a higher "loading" dose may be given so that an effective blood level of the medicine is reached quickly. The loading dose is then adjusted downward until a maintenance dose (a dose which is enough to keep the INR where it should be) is reached. Once you are on a stable maintenance dose, the PT and INR are watched less often, usually once every 2 to 4 weeks. The warfarin dose may be changed at times in response to a changing INR or to medical changes that call for an increase or decrease in warfarin therapy. It is important to keep all laboratory and caregiver follow-up appointments. Not keeping appointments could result in a chronic or permanent injury, pain, disability, and even death.  WHAT ARE THE SIDE EFFECTS OF WARFARIN?   Excessive blood thinning can cause bleeding (hemorrhage) from any part of the body. Individuals on warfarin should report any falls or accidents, or any symptoms of bleeding or unusual bruising. Signs of unusual bleeding may include bleeding from the gums, blood in the urine, bloody or dark stool, a nosebleed, coughing up blood, or vomiting blood. Because the risk of bleeding increases as the INR   rises above the desired limit, the INR is closely watched during warfarin therapy. Adjustments are made as needed to keep the INR at an acceptable level (within the target range).   Too little warfarin continues to allow the risk for blood clots.    Other body systems can be affected by warfarin. In addition to any signs of bleeding, patients should report skin rash or irritation, unusual fever, continual nausea or stomach upset, severe pain in the joints or back, swelling or pain at an injection site, new and severe headaches, or symptoms of a stroke (new weaknesses, change in mental status, visual changes, new dizziness, trouble speaking).   IS WARFARIN SAFE TO USE DURING PREGNANCY?   Warfarin is generally not advised during pregnancy, at least during the first trimester, due to an increased risk of birth defects. A woman who becomes pregnant or plans to become pregnant while on warfarin therapy should notify the caregiver immediately.   Although warfarin does not pass into breast milk, a woman who wishes to breastfeed while taking warfarin should also consult with her caregiver.  ARE THERE SPECIAL PRECAUTIONS TO FOLLOW WHILE TAKING WARFARIN?  Follow the dosage schedule carefully. Warfarin should be taken exactly as directed. A missed or extra dose requires a call to the prescribing caregiver for advice. Do not change the dose of warfarin on your own to make up for missed or extra doses. It is very important to take warfarin as directed since bleeding or blood clots could result in chronic or permanent injury, pain, or disability.  Warfarin tablets come in different strengths. Each tablet strength is usually a different color, with the amount of warfarin (in milligrams) clearly printed on the tablet. You should immediately report to the pharmacist or caregiver any prescription which is different than the previous one, to make sure that you are taking the correct dose.  Avoid situations that cause bleeding. You may have a tendency to bleed more easily than usual while taking warfarin. The following changes can limit bleeding:   Using a softer toothbrush.   Flossing with waxed floss rather than unwaxed floss.    Shaving with an electric razor rather than a blade.   Limiting the use of sharp objects.   Avoiding potentially harmful activities (such as contact sports).  Medical conditions. Medical conditions which increase your clotting time include:   Diarrhea.   Worsening of heart failure.   Fever.   Worsening of liver function.  Alcohol use. Alcohol can change the body's ability to handle warfarin. It is best to avoid alcoholic drinks or consume only very small amounts while taking warfarin. Notify your caregiver if you change your alcohol intake.  Other medicines. Many medicines can interfere with warfarin and affect the PT and INR results. Some of the most common over-the-counter pain relievers (acetaminophen, aspirin, and nonsteroidal anti-inflammatory medicines) make the anticoagulant effects of warfarin stronger. If these medicines are taken, your caregiver may need to adjust the dose of warfarin. The use of many antibiotics can increase the effects of warfarin, and therefore increase bleeding risk. Do not take or discontinue any prescribed or over-the-counter medicine except on the advice of your caregiver or pharmacist.   Dietary considerations. Do not drink herbal teas containing coumarin while taking this medicine. Many foods, especially foods high in vitamin K can interfere with warfarin and affect the PT and INR results. Foods high in vitamin K can cause warfarin to be less effective. You should eat a consistent amount of foods high   in vitamin K. The serving size for foods high in vitamin K are  cup cooked and 1 cup raw, unless otherwise noted. These foods include:   Beef liver (3.5 ounces).   Garbanzo beans.   Kale.   Spinach.   Nettle greens.   Swiss chard.   Pork liver (3.5 ounces).   Broccoli.   Cabbage.   Watercress.   Soybeans.   Endive.   Green tea (made with  ounce dried tea).   Brussels sprouts.   Cauliflower.   Parsley (1 Tablespoon).   Collard greens.    Seaweed (limit 2 sheets).   Turnip greens.   Soybean oil.   Green peas.  It is not necessary to avoid these foods, but avoid major changes in your diet, or notify your caregiver before changing your diet. Changing your diet to include less foods containing vitamin K may lead to an excessive warfarin effect. Arrange a visit with a dietitian to answer your questions.   IS IT SAFE TO USE SUPPLEMENTS WHILE TAKING WARFARIN?   Vitamin E. Vitamin E may increase the anticoagulant effects of warfarin. You should consult your caregiver before adding or changing a dose of vitamin E or any other vitamin.   Check other supplements such as multivitamins and calcium supplements. These may contain vitamin K.  Document Released: 12/02/2005 Document Revised: 08/26/2012 Document Reviewed: 05/12/2012  [employer] Patient Information 2014 [employer].

## 2013-08-20 NOTE — Assessment & Plan Note (Signed)
Continue b12 shots.  

## 2013-08-20 NOTE — Assessment & Plan Note (Signed)
Last seizure 1 year ago Continue current medications for now

## 2013-08-20 NOTE — Progress Notes (Signed)
HPI  Pt presents to the clinic today to establish care. He is transferring care from Dr. Perrine's office. He has no concerns today.  Flu: yearly (2013) Tetanus: unsure Pneumovax: unsure Colonoscopy :2004 Eye doctor: yearly Dentist: as needed  Past Medical History  Diagnosis Date  . Hypertension   . Epilepsy   . CAD (coronary artery disease)   . Hx of CABG   . Stroke   . B12 deficiency   . Low testosterone   . BPH (benign prostatic hyperplasia)   . A-fib   . Fall   . Seizures   . Hyperlipidemia   . Chronic anticoagulation     Current Outpatient Prescriptions  Medication Sig Dispense Refill  . cyanocobalamin (,VITAMIN B-12,) 1000 MCG/ML injection Inject 1,000 mcg into the muscle every 30 (thirty) days.      Marland Kitchen docusate sodium (COLACE) 100 MG capsule Take 100 mg by mouth at bedtime.        Marland Kitchen labetalol (NORMODYNE) 200 MG tablet Take 1 tablet (200 mg total) by mouth 3 (three) times daily.  90 tablet  1  . levETIRAcetam (KEPPRA) 500 MG tablet Take 500 mg by mouth every 12 (twelve) hours.      . pantoprazole (PROTONIX) 40 MG tablet Take 1 tablet (40 mg total) by mouth 2 (two) times daily. For acid reflux  60 tablet  1  . phenytoin (DILANTIN) 100 MG ER capsule Take 200 mg by mouth 2 (two) times daily.       Marland Kitchen testosterone cypionate (DEPOTESTOTERONE CYPIONATE) 200 MG/ML injection Inject 300 mg into the muscle every 28 (twenty-eight) days.      . valsartan (DIOVAN) 80 MG tablet Take 1 tablet (80 mg total) by mouth every morning.  30 tablet  1  . Vitamin D, Ergocalciferol, (DRISDOL) 50000 UNITS CAPS Take 50,000 Units by mouth every 7 (seven) days. On Tuesday      . warfarin (COUMADIN) 5 MG tablet Take 1 tablet (5 mg total) by mouth daily.  30 tablet  3  . escitalopram (LEXAPRO) 10 MG tablet Take 10 mg by mouth daily.      . feeding supplement (ENSURE COMPLETE) LIQD Take 237 mLs by mouth 2 (two) times daily.      . hydrALAZINE (APRESOLINE) 100 MG tablet Take 100 mg by mouth 3 (three)  times daily.      . hydrocortisone (ANUSOL-HC) 25 MG suppository Place 1 suppository (25 mg total) rectally 2 (two) times daily.  12 suppository  0  . levalbuterol (XOPENEX) 0.63 MG/3ML nebulizer solution Take 3 mLs (0.63 mg total) by nebulization every 3 (three) hours as needed for wheezing or shortness of breath.  3 mL    . levofloxacin (LEVAQUIN) 750 MG tablet Take 1 tablet (750 mg total) by mouth daily. 5 more days      . risperiDONE (RISPERDAL) 0.5 MG tablet Take 0.5 mg by mouth at bedtime.       No current facility-administered medications for this visit.    Allergies  Allergen Reactions  . Ace Inhibitors     unknown    Family History  Problem Relation Age of Onset  . Heart disease Mother   . Heart disease Brother   . Heart disease Brother     History   Social History  . Marital Status: Single    Spouse Name: N/A    Number of Children: N/A  . Years of Education: N/A   Occupational History  . Not on file.   Social  History Main Topics  . Smoking status: Former Smoker    Quit date: 12/16/1978  . Smokeless tobacco: Never Used  . Alcohol Use: No  . Drug Use: No  . Sexual Activity: No   Other Topics Concern  . Not on file   Social History Narrative  . No narrative on file    ROS:  Constitutional: Denies fever, malaise, fatigue, headache or abrupt weight changes.  HEENT: Denies eye pain, eye redness, ear pain, ringing in the ears, wax buildup, runny nose, nasal congestion, bloody nose, or sore throat. Respiratory: Denies difficulty breathing, shortness of breath, cough or sputum production.   Cardiovascular: Denies chest pain, chest tightness, palpitations or swelling in the hands or feet.  Gastrointestinal: Pt reports hemorrhoids. Denies abdominal pain, bloating, constipation, diarrhea or blood in the stool.  GU: Denies frequency, urgency, pain with urination, blood in urine, odor or discharge. Musculoskeletal: Pt reports difficulty with gait. Denies decrease  in range of motion, muscle pain or joint pain and swelling.  Skin: Denies redness, rashes, lesions or ulcercations.  Neurological: Denies dizziness, difficulty with memory, difficulty with speech or problems with balance and coordination.   No other specific complaints in a complete review of systems (except as listed in HPI above).  PE:  BP 118/80  Pulse 119  Temp(Src) 97.4 F (36.3 C) (Oral)  Wt 149 lb 4 oz (67.699 kg)  BMI 22.03 kg/m2  SpO2 92% Wt Readings from Last 3 Encounters:  08/20/13 149 lb 4 oz (67.699 kg)  07/28/13 151 lb 12.8 oz (68.856 kg)  01/07/13 148 lb 9.4 oz (67.4 kg)    General: Appears his stated age, well developed, well nourished in NAD. HEENT: Head: normal shape and size; Eyes: sclera white, no icterus, conjunctiva pink, PERRLA and EOMs intact; Ears: Tm's gray and intact, normal light reflex; Nose: mucosa pink and moist, septum midline; Throat/Mouth: Teeth present, mucosa pink and moist, no lesions or ulcerations noted.  Neck: Normal range of motion. Neck supple, trachea midline. No massses, lumps or thyromegaly present.  Cardiovascular: Normal rate and rhythm. S1,S2 noted.  Murmur noted. No rubs or gallops noted. No JVD or BLE edema. No carotid bruits noted. Pulmonary/Chest: Normal effort and positive vesicular breath sounds. No respiratory distress. No wheezes, rales or ronchi noted.  Abdomen: Soft and nontender. Normal bowel sounds, no bruits noted. No distention or masses noted. Liver, spleen and kidneys non palpable. Musculoskeletal: Normal range of motion. No signs of joint swelling. Using walker for assistance with gait.  Neurological: Alert and oriented. Cranial nerves II-XII intact. Coordination normal. +DTRs bilaterally. Psychiatric: Mood and affect normal. Behavior is normal. Judgment and thought content normal.    BMET    Component Value Date/Time   NA 134* 07/28/2013 1530   K 4.8 07/28/2013 1530   CL 98 07/28/2013 1530   CO2 27 07/28/2013 1530    GLUCOSE 95 07/28/2013 1530   BUN 26* 07/28/2013 1530   CREATININE 1.1 07/28/2013 1530   CREATININE 0.83 05/01/2009 0934   CALCIUM 8.9 07/28/2013 1530   CALCIUM 8.8 07/09/2011 0545   GFRNONAA 76* 01/11/2013 1050   GFRAA 88* 01/11/2013 1050     CBC    Component Value Date/Time   WBC 5.9 07/28/2013 1530   WBC 6.0 10/22/2010 1125   RBC 4.25 07/28/2013 1530   RBC 5.32 10/22/2010 1125   HGB 13.6 07/28/2013 1530   HGB 17.3* 10/22/2010 1125   HCT 40.5 07/28/2013 1530   HCT 51.1* 10/22/2010 1125   PLT  216.0 07/28/2013 1530   PLT 173 10/22/2010 1125   MCV 95.3 07/28/2013 1530   MCV 96.0 10/22/2010 1125   MCH 32.5 01/13/2013 0457   MCH 32.4 10/22/2010 1125   MCHC 33.6 07/28/2013 1530   MCHC 33.8 10/22/2010 1125   RDW 13.3 07/28/2013 1530   RDW 13.4 10/22/2010 1125   LYMPHSABS 1.0 07/28/2013 1530   LYMPHSABS 0.7* 10/22/2010 1125   MONOABS 0.6 07/28/2013 1530   MONOABS 0.6 10/22/2010 1125   EOSABS 0.4 07/28/2013 1530   EOSABS 0.1 10/22/2010 1125   BASOSABS 0.0 07/28/2013 1530   BASOSABS 0.0 10/22/2010 1125       Assessment and Plan:  Health Maintenance:  Will get records from Dr. Patria Mane office Flu shot today

## 2013-08-20 NOTE — Assessment & Plan Note (Signed)
Well controlled on current therapy If continues to have multiple falls, will peel back on some BP meds Continue current therapy for now

## 2013-08-20 NOTE — Assessment & Plan Note (Signed)
Continue testosterone injections.  

## 2013-08-24 ENCOUNTER — Telehealth: Payer: Self-pay | Admitting: Nurse Practitioner

## 2013-08-24 NOTE — Telephone Encounter (Signed)
Patient's son called to ask if Diovan could be changed to a less expensive medication.  Son states patient is paying $40 per month for this and when they saw his PCP this week the PCP suggested there may be a different medication that would be cheaper.  I advised patient's son that Dr. [place] is out of the office this week but that I would forward the message to Anabel Halon, LPN Dr. Elvis Coil nurse for follow up next week.  Patient's son verbalized understanding and agreement with plan of care.

## 2013-08-27 ENCOUNTER — Ambulatory Visit: Payer: Self-pay | Admitting: General Practice

## 2013-08-27 ENCOUNTER — Ambulatory Visit (INDEPENDENT_AMBULATORY_CARE_PROVIDER_SITE_OTHER): Payer: Medicare Other | Admitting: General Practice

## 2013-08-27 DIAGNOSIS — E349 Endocrine disorder, unspecified: Secondary | ICD-10-CM

## 2013-08-27 DIAGNOSIS — I2510 Atherosclerotic heart disease of native coronary artery without angina pectoris: Secondary | ICD-10-CM

## 2013-08-27 DIAGNOSIS — Z7901 Long term (current) use of anticoagulants: Secondary | ICD-10-CM

## 2013-08-27 DIAGNOSIS — E291 Testicular hypofunction: Secondary | ICD-10-CM

## 2013-08-27 DIAGNOSIS — I10 Essential (primary) hypertension: Secondary | ICD-10-CM

## 2013-08-27 DIAGNOSIS — I4891 Unspecified atrial fibrillation: Secondary | ICD-10-CM

## 2013-08-27 LAB — POCT INR: INR: 1.2

## 2013-08-27 MED ORDER — TESTOSTERONE CYPIONATE 100 MG/ML IM SOLN
150.0000 mg | INTRAMUSCULAR | Status: DC
  Administered 2013-08-27: 150 mg via INTRAMUSCULAR

## 2013-08-27 NOTE — Telephone Encounter (Signed)
He could switch from Diovan to Cozaar 50 mg daily.  Jing Howatt [place] MD, [facility]

## 2013-08-30 ENCOUNTER — Ambulatory Visit: Payer: Medicare Other

## 2013-08-31 MED ORDER — LOSARTAN POTASSIUM 50 MG PO TABS: 50.0000 mg | ORAL_TABLET | Freq: Every day | ORAL | Status: DC

## 2013-08-31 NOTE — Telephone Encounter (Signed)
Patient's son [name] called at [phone] no answer.Left message on personal voice mail Dr.Jordan advised stop Diovan and start Cozaar 50 mg daily.Prescription will be sent to patient's pharmacy.Advised to return my call to let me know you got this message.

## 2013-08-31 NOTE — Addendum Note (Signed)
Addended by: Meda Klinefelter D on: 08/31/2013 05:04 PM   Modules accepted: Orders, Medications

## 2013-09-02 ENCOUNTER — Encounter: Payer: Self-pay | Admitting: Internal Medicine

## 2013-09-02 NOTE — Telephone Encounter (Signed)
Spoke with patient's son [name] he stated he did receive my message and will have father stop diovan due to high cost and start cozaar 50 mg daily.Advised to follow up with Dr.Jordan in 2/15.

## 2013-09-03 ENCOUNTER — Other Ambulatory Visit: Payer: Self-pay | Admitting: General Practice

## 2013-09-03 ENCOUNTER — Ambulatory Visit (INDEPENDENT_AMBULATORY_CARE_PROVIDER_SITE_OTHER): Payer: Medicare Other | Admitting: General Practice

## 2013-09-03 DIAGNOSIS — K649 Unspecified hemorrhoids: Secondary | ICD-10-CM

## 2013-09-03 DIAGNOSIS — I10 Essential (primary) hypertension: Secondary | ICD-10-CM

## 2013-09-03 DIAGNOSIS — I4891 Unspecified atrial fibrillation: Secondary | ICD-10-CM

## 2013-09-03 DIAGNOSIS — I2510 Atherosclerotic heart disease of native coronary artery without angina pectoris: Secondary | ICD-10-CM

## 2013-09-03 DIAGNOSIS — Z7901 Long term (current) use of anticoagulants: Secondary | ICD-10-CM

## 2013-09-03 MED ORDER — HYDROCORTISONE ACETATE 25 MG RE SUPP: 25.0000 mg | Freq: Two times a day (BID) | RECTAL | Status: DC

## 2013-09-08 ENCOUNTER — Other Ambulatory Visit: Payer: Self-pay | Admitting: Cardiology

## 2013-09-16 ENCOUNTER — Ambulatory Visit: Payer: Medicare Other | Admitting: Internal Medicine

## 2013-09-21 ENCOUNTER — Ambulatory Visit (INDEPENDENT_AMBULATORY_CARE_PROVIDER_SITE_OTHER): Payer: Medicare Other | Admitting: General Practice

## 2013-09-21 DIAGNOSIS — I2510 Atherosclerotic heart disease of native coronary artery without angina pectoris: Secondary | ICD-10-CM

## 2013-09-21 DIAGNOSIS — Z7901 Long term (current) use of anticoagulants: Secondary | ICD-10-CM

## 2013-09-21 DIAGNOSIS — I4891 Unspecified atrial fibrillation: Secondary | ICD-10-CM

## 2013-09-21 DIAGNOSIS — I10 Essential (primary) hypertension: Secondary | ICD-10-CM

## 2013-10-11 ENCOUNTER — Other Ambulatory Visit: Payer: Self-pay | Admitting: Cardiology

## 2013-10-12 ENCOUNTER — Ambulatory Visit (INDEPENDENT_AMBULATORY_CARE_PROVIDER_SITE_OTHER): Payer: Medicare Other | Admitting: General Practice

## 2013-10-12 DIAGNOSIS — I10 Essential (primary) hypertension: Secondary | ICD-10-CM

## 2013-10-12 DIAGNOSIS — I4891 Unspecified atrial fibrillation: Secondary | ICD-10-CM

## 2013-10-12 DIAGNOSIS — Z7901 Long term (current) use of anticoagulants: Secondary | ICD-10-CM

## 2013-10-12 DIAGNOSIS — I2510 Atherosclerotic heart disease of native coronary artery without angina pectoris: Secondary | ICD-10-CM

## 2013-10-12 LAB — POCT INR: INR: 1.7

## 2013-10-24 ENCOUNTER — Other Ambulatory Visit: Payer: Self-pay | Admitting: Neurology

## 2013-11-05 ENCOUNTER — Other Ambulatory Visit: Payer: Self-pay | Admitting: Cardiology

## 2013-11-09 ENCOUNTER — Ambulatory Visit (INDEPENDENT_AMBULATORY_CARE_PROVIDER_SITE_OTHER): Payer: Medicare Other | Admitting: General Practice

## 2013-11-09 ENCOUNTER — Telehealth: Payer: Self-pay | Admitting: Cardiology

## 2013-11-09 ENCOUNTER — Other Ambulatory Visit: Payer: Self-pay | Admitting: Cardiology

## 2013-11-09 DIAGNOSIS — Z7901 Long term (current) use of anticoagulants: Secondary | ICD-10-CM

## 2013-11-09 DIAGNOSIS — I4891 Unspecified atrial fibrillation: Secondary | ICD-10-CM

## 2013-11-09 DIAGNOSIS — I2510 Atherosclerotic heart disease of native coronary artery without angina pectoris: Secondary | ICD-10-CM

## 2013-11-09 DIAGNOSIS — I10 Essential (primary) hypertension: Secondary | ICD-10-CM

## 2013-11-09 LAB — POCT INR: INR: 1.8

## 2013-11-09 MED ORDER — LABETALOL HCL 200 MG PO TABS: 200.0000 mg | ORAL_TABLET | Freq: Three times a day (TID) | ORAL | Status: DC

## 2013-11-09 NOTE — Telephone Encounter (Signed)
New problem   Pt stated someone called him. Please call pt.

## 2013-11-09 NOTE — Telephone Encounter (Signed)
Returned call to patient's son he stated father needed refill on Labetolol.Refill sent to pharmacy.Appointment scheduled with Dr.Jordan 01/18/14 at 11:15am.

## 2013-11-09 NOTE — Progress Notes (Signed)
Pre-visit discussion using our clinic review tool. No additional management support is needed unless otherwise documented below in the visit note.  

## 2013-11-15 ENCOUNTER — Other Ambulatory Visit: Payer: Self-pay | Admitting: Neurology

## 2013-11-22 ENCOUNTER — Telehealth: Payer: Self-pay | Admitting: Internal Medicine

## 2013-11-22 ENCOUNTER — Emergency Department (HOSPITAL_COMMUNITY)
Admission: EM | Admit: 2013-11-22 | Discharge: 2013-11-22 | Disposition: A | Discharge status: Home / Self Care | Payer: Medicare Other | Source: Home / Self Care | Attending: Emergency Medicine | Admitting: Emergency Medicine

## 2013-11-22 ENCOUNTER — Encounter (HOSPITAL_COMMUNITY): Payer: Self-pay | Admitting: Emergency Medicine

## 2013-11-22 DIAGNOSIS — E538 Deficiency of other specified B group vitamins: Secondary | ICD-10-CM | POA: Insufficient documentation

## 2013-11-22 DIAGNOSIS — I472 Ventricular tachycardia, unspecified: Secondary | ICD-10-CM | POA: Insufficient documentation

## 2013-11-22 DIAGNOSIS — G40909 Epilepsy, unspecified, not intractable, without status epilepticus: Secondary | ICD-10-CM | POA: Insufficient documentation

## 2013-11-22 DIAGNOSIS — E291 Testicular hypofunction: Secondary | ICD-10-CM | POA: Insufficient documentation

## 2013-11-22 DIAGNOSIS — I4891 Unspecified atrial fibrillation: Secondary | ICD-10-CM | POA: Insufficient documentation

## 2013-11-22 DIAGNOSIS — Z79899 Other long term (current) drug therapy: Secondary | ICD-10-CM | POA: Insufficient documentation

## 2013-11-22 DIAGNOSIS — I10 Essential (primary) hypertension: Secondary | ICD-10-CM | POA: Insufficient documentation

## 2013-11-22 DIAGNOSIS — E785 Hyperlipidemia, unspecified: Secondary | ICD-10-CM | POA: Insufficient documentation

## 2013-11-22 DIAGNOSIS — I2510 Atherosclerotic heart disease of native coronary artery without angina pectoris: Secondary | ICD-10-CM | POA: Insufficient documentation

## 2013-11-22 DIAGNOSIS — Z951 Presence of aortocoronary bypass graft: Secondary | ICD-10-CM | POA: Insufficient documentation

## 2013-11-22 DIAGNOSIS — Z7901 Long term (current) use of anticoagulants: Secondary | ICD-10-CM | POA: Insufficient documentation

## 2013-11-22 DIAGNOSIS — Z95818 Presence of other cardiac implants and grafts: Secondary | ICD-10-CM | POA: Insufficient documentation

## 2013-11-22 DIAGNOSIS — Z87891 Personal history of nicotine dependence: Secondary | ICD-10-CM | POA: Insufficient documentation

## 2013-11-22 DIAGNOSIS — I4729 Other ventricular tachycardia: Secondary | ICD-10-CM | POA: Insufficient documentation

## 2013-11-22 DIAGNOSIS — Z8673 Personal history of transient ischemic attack (TIA), and cerebral infarction without residual deficits: Secondary | ICD-10-CM | POA: Insufficient documentation

## 2013-11-22 LAB — CBC WITH DIFFERENTIAL/PLATELET
Basophils Relative: 1 % (ref 0–1)
Eosinophils Absolute: 0.4 10*3/uL (ref 0.0–0.7)
Eosinophils Relative: 7 % — ABNORMAL HIGH (ref 0–5)
HCT: 46.5 % (ref 39.0–52.0)
Hemoglobin: 15.8 g/dL (ref 13.0–17.0)
Lymphocytes Relative: 13 % (ref 12–46)
MCH: 32.2 pg (ref 26.0–34.0)
MCHC: 34 g/dL (ref 30.0–36.0)
Monocytes Absolute: 0.6 10*3/uL (ref 0.1–1.0)
Monocytes Relative: 11 % (ref 3–12)

## 2013-11-22 LAB — TROPONIN I: Troponin I: <0.3 ng/mL (ref <0.30)

## 2013-11-22 LAB — BASIC METABOLIC PANEL
BUN: 27 mg/dL — ABNORMAL HIGH (ref 6–23)
Creatinine, Ser: 1.07 mg/dL (ref 0.50–1.35)
GFR calc Af Amer: 71 mL/min — ABNORMAL LOW (ref >90)
GFR calc non Af Amer: 61 mL/min — ABNORMAL LOW (ref >90)
Sodium: 134 mEq/L — ABNORMAL LOW (ref 135–145)

## 2013-11-22 LAB — URINALYSIS, ROUTINE W REFLEX MICROSCOPIC
Bilirubin Urine: NEGATIVE
Glucose, UA: NEGATIVE mg/dL
Ketones, ur: NEGATIVE mg/dL
pH: 5.5 (ref 5.0–8.0)

## 2013-11-22 LAB — PROTIME-INR: INR: 2.8 — ABNORMAL HIGH (ref 0.00–1.49)

## 2013-11-22 NOTE — Telephone Encounter (Signed)
Pt's son call to find out Rene Kocher is no longer here. Would like to switch to Dr. Jonny Ruiz to stay at [facility].

## 2013-11-22 NOTE — ED Provider Notes (Signed)
CSN: [id number]     Arrival date & time 11/22/13  4098 History   First MD Initiated Contact with Patient 11/22/13 0730     Chief Complaint  Patient presents with  . Fatigue   (Consider location/radiation/quality/duration/timing/severity/associated sxs/prior Treatment) HPI Comments: Pt comes in with cc of elevated BP. Pt states that his BP was as high as 170s SBP y'day, and he wanted to get checked out, so that he doesn't end up with a stroke like he did 4 years ago. He denies any changes to his current medications, and has been taking his meds as prescribed. Pt has no n/v/f/c/chest pain/dib/headahce/visual complains/gait instability. Pt reports having no complains otherwise to me.  The history is provided by the patient.    Past Medical History  Diagnosis Date  . Hypertension   . Epilepsy   . CAD (coronary artery disease)   . Hx of CABG   . Stroke   . B12 deficiency   . Low testosterone   . BPH (benign prostatic hyperplasia)   . A-fib   . Fall   . Seizures   . Hyperlipidemia   . Chronic anticoagulation    Past Surgical History  Procedure Laterality Date  . Back surgery  05/2011    cement fusion of broken vertebrae  . Cardiac surgery    . Hernia repair    . Coronary artery bypass graft    . Cardiac catheterization  02/11/2007    EF 55-60%  . Cardiac catheterization  01/04/2005    EF 55%  . Angioplasty  1987  . Hernia repair    . Pilonidal cyst excision    . US echocardiography  01/18/2009    EF 55-60%  . US echocardiography  03/11/2003    EF 55-60%  . Cardiovascular stress test  11/05/2004    NO EVIDENCE OF ISCHEMIA   Family History  Problem Relation Age of Onset  . Heart disease Mother   . Heart disease Brother   . Heart disease Brother    History  Substance Use Topics  . Smoking status: Former Smoker    Quit date: 12/16/1978  . Smokeless tobacco: Never Used  . Alcohol Use: No    Review of Systems  Constitutional: Negative for activity change and appetite  change.  Eyes: Negative for pain and visual disturbance.  Respiratory: Negative for cough and shortness of breath.   Cardiovascular: Negative for chest pain.  Gastrointestinal: Negative for nausea, vomiting and abdominal pain.  Genitourinary: Negative for dysuria.  Neurological: Negative for dizziness, light-headedness and headaches.    Allergies  Ace inhibitors  Home Medications   Current Outpatient Rx  Name  Route  Sig  Dispense  Refill  . escitalopram (LEXAPRO) 10 MG tablet   Oral   Take 10 mg by mouth daily.         . hydrALAZINE (APRESOLINE) 100 MG tablet   Oral   Take 100 mg by mouth 3 (three) times daily.         Marland Kitchen labetalol (NORMODYNE) 200 MG tablet   Oral   Take 1 tablet (200 mg total) by mouth 3 (three) times daily.   90 tablet   6   . levETIRAcetam (KEPPRA) 500 MG tablet      TAKE 1 TABLET BY MOUTH TWICE A DAY   60 tablet   11   . losartan (COZAAR) 50 MG tablet   Oral   Take 1 tablet (50 mg total) by mouth daily.   30 tablet  6   . pantoprazole (PROTONIX) 40 MG tablet      TAKE 1 TABLET BY MOUTH TWICE A DAY   60 tablet   2   . phenytoin (DILANTIN) 100 MG ER capsule      TAKE 2 CAPSULES TWICE A DAY   120 capsule   0     PLEASE SCHEDULE APPT   . testosterone cypionate (DEPOTESTOTERONE CYPIONATE) 200 MG/ML injection   Intramuscular   Inject 1.5 mLs (300 mg total) into the muscle every 28 (twenty-eight) days.   10 mL   0   . Vitamin D, Ergocalciferol, (DRISDOL) 50000 UNITS CAPS   Oral   Take 50,000 Units by mouth every 7 (seven) days. On Tuesday         . warfarin (COUMADIN) 5 MG tablet   Oral   Take 5 mg by mouth daily. Takes 1 1/2 on tuesdays and wednesdays          BP 153/68  Pulse 73  Temp(Src) 97.9 F (36.6 C) (Oral)  Resp 19  SpO2 96% Physical Exam  Nursing note and vitals reviewed. Constitutional: He is oriented to person, place, and time. He appears well-developed.  HENT:  Head: Normocephalic and atraumatic.   Eyes: Conjunctivae and EOM are normal. Pupils are equal, round, and reactive to light.  Neck: Normal range of motion. Neck supple.  Cardiovascular: Normal rate.   Pulmonary/Chest: Effort normal and breath sounds normal.  Abdominal: Soft. Bowel sounds are normal. He exhibits no distension. There is no tenderness. There is no rebound and no guarding.  Musculoskeletal: He exhibits no edema.  Neurological: He is alert and oriented to person, place, and time. No cranial nerve deficit. Coordination normal.  Skin: Skin is warm.    ED Course  Procedures (including critical care time) Labs Review Labs Reviewed  CBC WITH DIFFERENTIAL - Abnormal; Notable for the following:    Eosinophils Relative 7 (*)    All other components within normal limits  BASIC METABOLIC PANEL - Abnormal; Notable for the following:    Sodium 134 (*)    BUN 27 (*)    GFR calc non Af Amer 61 (*)    GFR calc Af Amer 71 (*)    All other components within normal limits  PROTIME-INR - Abnormal; Notable for the following:    Prothrombin Time 28.5 (*)    INR 2.80 (*)    All other components within normal limits  TROPONIN I  APTT  URINALYSIS, ROUTINE W REFLEX MICROSCOPIC   Imaging Review No results found.  EKG Interpretation   None       MDM  No diagnosis found.  Date: 11/22/2013  Rate: 74  Rhythm: atrial fibrillation  QRS Axis: normal  Intervals: normal  ST/T Wave abnormalities: nonspecific ST/T changes  Conduction Disutrbances:none  Narrative Interpretation:   Old EKG Reviewed: unchanged  PT comes in with cc of elevated BP. BP measured at 158/70s in the ED. At home his BP allegedly has been 170s x 1-2 days. Regardless, pt has no associated sx. He mentioned just feeling little unwell in triage, and so basic labs have been sent out. No fevers, cough, uti like sx. If labs are WNL, will ask pcp f/u.      Derwood Kaplan, MD 11/22/13 [phone]

## 2013-11-22 NOTE — Telephone Encounter (Signed)
Ok with me 

## 2013-11-22 NOTE — ED Notes (Signed)
Patient with history of high blood pressure and stroke reports "not feeling well" for three days.  Denies chest pain, shortness of breath, fever, nausea or vomiting.  EMS reported BP slightly elevated but patient has not taken blood pressure medication this morning yet.

## 2013-11-23 ENCOUNTER — Other Ambulatory Visit: Payer: Self-pay | Admitting: Cardiology

## 2013-11-23 ENCOUNTER — Other Ambulatory Visit: Payer: Self-pay | Admitting: General Practice

## 2013-11-23 MED ORDER — WARFARIN SODIUM 5 MG PO TABS: ORAL_TABLET | ORAL | Status: DC

## 2013-11-24 ENCOUNTER — Encounter (HOSPITAL_COMMUNITY): Payer: Self-pay | Admitting: Emergency Medicine

## 2013-11-24 ENCOUNTER — Emergency Department (HOSPITAL_COMMUNITY): Payer: Medicare Other

## 2013-11-24 ENCOUNTER — Inpatient Hospital Stay (HOSPITAL_COMMUNITY)
Admission: EM | Admit: 2013-11-24 | Discharge: 2013-11-29 | DRG: 556 | Disposition: A | Discharge status: Skilled Nursing Facility | Payer: Medicare Other | Attending: Internal Medicine | Admitting: Internal Medicine

## 2013-11-24 DIAGNOSIS — M6281 Muscle weakness (generalized): Principal | ICD-10-CM

## 2013-11-24 DIAGNOSIS — R296 Repeated falls: Secondary | ICD-10-CM

## 2013-11-24 DIAGNOSIS — F411 Generalized anxiety disorder: Secondary | ICD-10-CM | POA: Diagnosis present

## 2013-11-24 DIAGNOSIS — E860 Dehydration: Secondary | ICD-10-CM

## 2013-11-24 DIAGNOSIS — R079 Chest pain, unspecified: Secondary | ICD-10-CM

## 2013-11-24 DIAGNOSIS — R4781 Slurred speech: Secondary | ICD-10-CM

## 2013-11-24 DIAGNOSIS — Z87891 Personal history of nicotine dependence: Secondary | ICD-10-CM

## 2013-11-24 DIAGNOSIS — Z8673 Personal history of transient ischemic attack (TIA), and cerebral infarction without residual deficits: Secondary | ICD-10-CM

## 2013-11-24 DIAGNOSIS — I10 Essential (primary) hypertension: Secondary | ICD-10-CM

## 2013-11-24 DIAGNOSIS — K649 Unspecified hemorrhoids: Secondary | ICD-10-CM

## 2013-11-24 DIAGNOSIS — E871 Hypo-osmolality and hyponatremia: Secondary | ICD-10-CM

## 2013-11-24 DIAGNOSIS — F329 Major depressive disorder, single episode, unspecified: Secondary | ICD-10-CM | POA: Diagnosis present

## 2013-11-24 DIAGNOSIS — E349 Endocrine disorder, unspecified: Secondary | ICD-10-CM

## 2013-11-24 DIAGNOSIS — I4891 Unspecified atrial fibrillation: Secondary | ICD-10-CM

## 2013-11-24 DIAGNOSIS — R29898 Other symptoms and signs involving the musculoskeletal system: Secondary | ICD-10-CM

## 2013-11-24 DIAGNOSIS — R569 Unspecified convulsions: Secondary | ICD-10-CM

## 2013-11-24 DIAGNOSIS — E785 Hyperlipidemia, unspecified: Secondary | ICD-10-CM

## 2013-11-24 DIAGNOSIS — F3289 Other specified depressive episodes: Secondary | ICD-10-CM | POA: Diagnosis present

## 2013-11-24 DIAGNOSIS — Z951 Presence of aortocoronary bypass graft: Secondary | ICD-10-CM

## 2013-11-24 DIAGNOSIS — Z7901 Long term (current) use of anticoagulants: Secondary | ICD-10-CM

## 2013-11-24 DIAGNOSIS — K219 Gastro-esophageal reflux disease without esophagitis: Secondary | ICD-10-CM | POA: Diagnosis present

## 2013-11-24 DIAGNOSIS — J209 Acute bronchitis, unspecified: Secondary | ICD-10-CM

## 2013-11-24 DIAGNOSIS — Z8249 Family history of ischemic heart disease and other diseases of the circulatory system: Secondary | ICD-10-CM

## 2013-11-24 DIAGNOSIS — I2510 Atherosclerotic heart disease of native coronary artery without angina pectoris: Secondary | ICD-10-CM

## 2013-11-24 DIAGNOSIS — E538 Deficiency of other specified B group vitamins: Secondary | ICD-10-CM

## 2013-11-24 DIAGNOSIS — N400 Enlarged prostate without lower urinary tract symptoms: Secondary | ICD-10-CM | POA: Diagnosis present

## 2013-11-24 DIAGNOSIS — G40309 Generalized idiopathic epilepsy and epileptic syndromes, not intractable, without status epilepticus: Secondary | ICD-10-CM | POA: Diagnosis present

## 2013-11-24 HISTORY — DX: Other generalized epilepsy and epileptic syndromes, not intractable, without status epilepticus: G40.409

## 2013-11-24 HISTORY — DX: Muscle weakness (generalized): M62.81

## 2013-11-24 HISTORY — DX: Gastro-esophageal reflux disease without esophagitis: K21.9

## 2013-11-24 HISTORY — DX: Angina pectoris, unspecified: I20.9

## 2013-11-24 HISTORY — DX: Heart failure, unspecified: I50.9

## 2013-11-24 HISTORY — DX: Major depressive disorder, single episode, unspecified: F32.9

## 2013-11-24 HISTORY — DX: Migraine, unspecified, not intractable, without status migrainosus: G43.909

## 2013-11-24 HISTORY — DX: Depression, unspecified: F32.A

## 2013-11-24 HISTORY — DX: Other symptoms and signs involving the musculoskeletal system: R29.898

## 2013-11-24 HISTORY — DX: Chest pain, unspecified: R07.9

## 2013-11-24 HISTORY — DX: Anxiety disorder, unspecified: F41.9

## 2013-11-24 LAB — CBC WITH DIFFERENTIAL/PLATELET
Basophils Absolute: 0 10*3/uL (ref 0.0–0.1)
Eosinophils Absolute: 0.3 10*3/uL (ref 0.0–0.7)
Eosinophils Relative: 5 % (ref 0–5)
Hemoglobin: 15.3 g/dL (ref 13.0–17.0)
Lymphocytes Relative: 19 % (ref 12–46)
Lymphs Abs: 1.1 10*3/uL (ref 0.7–4.0)
MCV: 96.8 fL (ref 78.0–100.0)
Monocytes Absolute: 0.8 10*3/uL (ref 0.1–1.0)
Monocytes Relative: 13 % — ABNORMAL HIGH (ref 3–12)
Neutrophils Relative %: 63 % (ref 43–77)
Platelets: 184 10*3/uL (ref 150–400)
RDW: 13 % (ref 11.5–15.5)

## 2013-11-24 LAB — BASIC METABOLIC PANEL
CO2: 26 mEq/L (ref 19–32)
Calcium: 8.7 mg/dL (ref 8.4–10.5)
Chloride: 96 mEq/L (ref 96–112)
Creatinine, Ser: 1.26 mg/dL (ref 0.50–1.35)
GFR calc non Af Amer: 50 mL/min — ABNORMAL LOW (ref >90)
Glucose, Bld: 86 mg/dL (ref 70–99)
Potassium: 4.7 mEq/L (ref 3.5–5.1)
Sodium: 132 mEq/L — ABNORMAL LOW (ref 135–145)

## 2013-11-24 LAB — URINALYSIS, ROUTINE W REFLEX MICROSCOPIC
Bilirubin Urine: NEGATIVE
Glucose, UA: NEGATIVE mg/dL
Ketones, ur: NEGATIVE mg/dL
Leukocytes, UA: NEGATIVE
Nitrite: NEGATIVE
Urobilinogen, UA: 0.2 mg/dL (ref 0.0–1.0)
pH: 5 (ref 5.0–8.0)

## 2013-11-24 LAB — PROTIME-INR
INR: 2.93 — ABNORMAL HIGH (ref 0.00–1.49)
Prothrombin Time: 29.5 seconds — ABNORMAL HIGH (ref 11.6–15.2)

## 2013-11-24 LAB — PRO B NATRIURETIC PEPTIDE: Pro B Natriuretic peptide (BNP): 326.2 pg/mL (ref 0–450)

## 2013-11-24 LAB — TROPONIN I: Troponin I: <0.3 ng/mL (ref <0.30)

## 2013-11-24 MED ORDER — ONDANSETRON HCL 4 MG PO TABS: 4.0000 mg | ORAL_TABLET | Freq: Four times a day (QID) | ORAL | Status: DC | PRN

## 2013-11-24 MED ORDER — PANTOPRAZOLE SODIUM 40 MG PO TBEC
40.0000 mg | DELAYED_RELEASE_TABLET | Freq: Two times a day (BID) | ORAL | Status: DC
  Administered 2013-11-24 – 2013-11-29 (×9): 40 mg via ORAL
  Filled 2013-11-24 (×8): qty 1

## 2013-11-24 MED ORDER — WARFARIN SODIUM 5 MG PO TABS
5.0000 mg | ORAL_TABLET | Freq: Once | ORAL | Status: AC
  Administered 2013-11-24: 5 mg via ORAL
  Filled 2013-11-24: qty 1

## 2013-11-24 MED ORDER — ACETAMINOPHEN 325 MG PO TABS: 650.0000 mg | ORAL_TABLET | Freq: Four times a day (QID) | ORAL | Status: DC | PRN

## 2013-11-24 MED ORDER — PHENYTOIN SODIUM EXTENDED 100 MG PO CAPS
200.0000 mg | ORAL_CAPSULE | Freq: Two times a day (BID) | ORAL | Status: DC
  Administered 2013-11-24 – 2013-11-29 (×10): 200 mg via ORAL
  Filled 2013-11-24 (×11): qty 2

## 2013-11-24 MED ORDER — SODIUM CHLORIDE 0.9 % IV SOLN
INTRAVENOUS | Status: DC
  Administered 2013-11-24: via INTRAVENOUS

## 2013-11-24 MED ORDER — WARFARIN - PHARMACIST DOSING INPATIENT
Freq: Every day | Status: DC
  Administered 2013-11-27 – 2013-11-28 (×2)

## 2013-11-24 MED ORDER — ACETAMINOPHEN 650 MG RE SUPP: 650.0000 mg | Freq: Four times a day (QID) | RECTAL | Status: DC | PRN

## 2013-11-24 MED ORDER — ONDANSETRON HCL 4 MG/2ML IJ SOLN: 4.0000 mg | Freq: Four times a day (QID) | INTRAMUSCULAR | Status: DC | PRN

## 2013-11-24 MED ORDER — SODIUM CHLORIDE 0.9 % IJ SOLN
3.0000 mL | Freq: Two times a day (BID) | INTRAMUSCULAR | Status: DC
  Administered 2013-11-25 – 2013-11-29 (×8): 3 mL via INTRAVENOUS

## 2013-11-24 MED ORDER — LEVETIRACETAM 500 MG PO TABS
500.0000 mg | ORAL_TABLET | Freq: Two times a day (BID) | ORAL | Status: DC
  Administered 2013-11-24 – 2013-11-29 (×10): 500 mg via ORAL
  Filled 2013-11-24 (×11): qty 1

## 2013-11-24 MED ORDER — HYDRALAZINE HCL 100 MG PO TABS
100.0000 mg | ORAL_TABLET | Freq: Three times a day (TID) | ORAL | Status: DC
  Administered 2013-11-24 – 2013-11-29 (×14): 100 mg via ORAL
  Filled 2013-11-24 (×16): qty 1

## 2013-11-24 MED ORDER — LABETALOL HCL 200 MG PO TABS
200.0000 mg | ORAL_TABLET | Freq: Three times a day (TID) | ORAL | Status: DC
  Administered 2013-11-24 – 2013-11-29 (×14): 200 mg via ORAL
  Filled 2013-11-24 (×16): qty 1

## 2013-11-24 NOTE — Progress Notes (Signed)
ANTICOAGULATION CONSULT NOTE - Initial Consult  Pharmacy Consult for Coumadin Indication: atrial fibrillation  Allergies  Allergen Reactions  . Ace Inhibitors     unknown    Patient Measurements: Height: 5\' 9"  (175.3 cm) Weight: 147 lb 11.2 oz (66.996 kg) IBW/kg (Calculated) : 70.7  Vital Signs: Temp: 97.8 F (36.6 C) (12/10 2119) Temp src: Oral (12/10 2119) BP: 168/79 mmHg (12/10 2119) Pulse Rate: 72 (12/10 2119)  Labs:  Recent Labs  11/22/13 0800 11/24/13 1846 11/24/13 1851 11/24/13 2006  HGB 15.8 15.3  --   --   HCT 46.5 44.8  --   --   PLT 196 184  --   --   APTT 36  --   --   --   LABPROT 28.5*  --   --  29.5*  INR 2.80*  --   --  2.93*  CREATININE 1.07 1.26  --   --   TROPONINI <0.30  --  <0.30  --     Estimated Creatinine Clearance: 40.6 ml/min (by C-G formula based on Cr of 1.26).   Medical History: Past Medical History  Diagnosis Date  . Hypertension   . CAD (coronary artery disease)   . B12 deficiency   . Low testosterone   . BPH (benign prostatic hyperplasia)   . A-fib   . Fall   . Hyperlipidemia   . Chronic anticoagulation   . CHF (congestive heart failure)   . Anginal pain   . GERD (gastroesophageal reflux disease)   . Migraines     "had them ~ 30-40 yr ago" (11/24/2013)  . Epilepsy   . Grand mal seizure     "last one was about 5 yr ago" (11/24/2013)  . Stroke 2010    denies residual on 11/24/2013  . Anxiety   . Depression     Assessment: 79 year old male on Coumadin PTA for Afib.  Admit INR = 2.93,  PTA dose is 7.5 mg Tuesday and Friday and 5 mg other days.  Goal of Therapy:  INR 2-3 Monitor platelets by anticoagulation protocol: Yes   Plan:  1) Coumadin 5 mg po x 1 dose tonight 2) Daily INR  Thank you. Okey Regal, PharmD [phone]  11/24/2013,10:49 PM

## 2013-11-24 NOTE — ED Notes (Signed)
The pt walks with a walker usually

## 2013-11-24 NOTE — Progress Notes (Signed)
Pt arrived to unit. Pt to bed. Oriented to unit and room.  VSS. Pt resting comfortably will call bell within reach.  Admitting MD paged.  WIll continue to monitor pt. Joseph Bentley

## 2013-11-24 NOTE — ED Notes (Signed)
The pt still needs his lab drawn. Lab coming. When the orders were placed the pt went right to xray

## 2013-11-24 NOTE — ED Notes (Signed)
Pt to xray
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
## 2013-11-24 NOTE — ED Notes (Signed)
The pt arrived by [facility] from assisted [facility].  He is c/o weakness and numbness in both legs for 3-4 days.  He was seen at [facility] long ed 2 days ago for the same.  [facility] decided he needed to come here today instead of [place] ed that was the pts choice.  Alert no distress

## 2013-11-24 NOTE — ED Notes (Signed)
The  Pt is alert no distress.  He denies pain anywhere.  Just wants to know why his legs are still weak.  Friend at the bedside

## 2013-11-24 NOTE — ED Notes (Signed)
The pt just returned from c-t and xray.  Pt alert no complaints.  Family at the bedside

## 2013-11-24 NOTE — ED Notes (Signed)
Pt given a urinal.

## 2013-11-25 ENCOUNTER — Inpatient Hospital Stay (HOSPITAL_COMMUNITY): Payer: Medicare Other

## 2013-11-25 ENCOUNTER — Ambulatory Visit: Payer: Medicare Other | Admitting: Internal Medicine

## 2013-11-25 DIAGNOSIS — I10 Essential (primary) hypertension: Secondary | ICD-10-CM

## 2013-11-25 LAB — COMPREHENSIVE METABOLIC PANEL
ALT: 9 U/L (ref 0–53)
BUN: 23 mg/dL (ref 6–23)
CO2: 22 mEq/L (ref 19–32)
Calcium: 8.7 mg/dL (ref 8.4–10.5)
Chloride: 99 mEq/L (ref 96–112)
Creatinine, Ser: 0.97 mg/dL (ref 0.50–1.35)
GFR calc Af Amer: 85 mL/min — ABNORMAL LOW (ref >90)
GFR calc non Af Amer: 73 mL/min — ABNORMAL LOW (ref >90)
Glucose, Bld: 86 mg/dL (ref 70–99)
Sodium: 132 mEq/L — ABNORMAL LOW (ref 135–145)
Total Bilirubin: 0.3 mg/dL (ref 0.3–1.2)
Total Protein: 6.4 g/dL (ref 6.0–8.3)

## 2013-11-25 LAB — CBC WITH DIFFERENTIAL/PLATELET
Basophils Relative: 1 % (ref 0–1)
Eosinophils Absolute: 0.3 10*3/uL (ref 0.0–0.7)
Eosinophils Relative: 6 % — ABNORMAL HIGH (ref 0–5)
Lymphs Abs: 0.8 10*3/uL (ref 0.7–4.0)
MCH: 31.8 pg (ref 26.0–34.0)
MCHC: 33.6 g/dL (ref 30.0–36.0)
MCV: 94.4 fL (ref 78.0–100.0)
Monocytes Relative: 12 % (ref 3–12)
Neutro Abs: 3.3 10*3/uL (ref 1.7–7.7)
Platelets: 165 10*3/uL (ref 150–400)
RBC: 4.66 MIL/uL (ref 4.22–5.81)
RDW: 12.8 % (ref 11.5–15.5)

## 2013-11-25 LAB — CK: Total CK: 156 U/L (ref 7–232)

## 2013-11-25 LAB — TROPONIN I: Troponin I: <0.3 ng/mL (ref <0.30)

## 2013-11-25 LAB — URINE CULTURE

## 2013-11-25 LAB — PHENYTOIN LEVEL, TOTAL: Phenytoin Lvl: 9 ug/mL — ABNORMAL LOW (ref 10.0–20.0)

## 2013-11-25 MED ORDER — WARFARIN SODIUM 5 MG PO TABS
5.0000 mg | ORAL_TABLET | Freq: Once | ORAL | Status: AC
  Administered 2013-11-25: 5 mg via ORAL
  Filled 2013-11-25: qty 1

## 2013-11-25 NOTE — Care Management Note (Unsigned)
    Page 1 of 1   11/26/2013     3:44:41 PM   CARE MANAGEMENT NOTE 11/26/2013  Patient:  Joseph Bentley, Joseph Bentley   Account Number:  [id number]  Date Initiated:  11/25/2013  Documentation initiated by:  Joseph Bentley  Subjective/Objective Assessment:   PT ADM ON 11/24/13 WITH CP X 3 DAYS AND LEG WEAKNESS.  PTA, PT RESIDES AT [facility].     Action/Plan:   WILL CONSULT CSW TO FACILITATE RETURN TO ALF WHEN MEDICALLY STABLE.  PT WILL NEED PT/OT CONSULTS PRIOR TO RETURN TO ALF.   Anticipated DC Date:  11/27/2013   Anticipated DC Plan:  SKILLED NURSING FACILITY  In-house referral  Clinical Social Worker      DC Planning Services  CM consult      Choice offered to / List presented to:             Status of service:  In process, will continue to follow Medicare Important Message given?   (If response is "NO", the following Medicare IM given date fields will be blank) Date Medicare IM given:   Date Additional Medicare IM given:    Discharge Disposition:    Per UR Regulation:  Reviewed for med. necessity/level of care/duration of stay  If discussed at Long Length of Stay Meetings, dates discussed:    Comments:  11/26/13 Joseph Fenderson,RN,BSN [phone] PER P.T. EVALUATION, PT WILL  REQUIRE SNF LEVEL OF CARE AT DC.  NEEDS 3 DAY INPATIENT STAY FOR MEDICARE TO RECEIVE REIMBURSEMENT.  CSW PLANS TO COORDINATE DC TO SNF TOMORROW, IF FACILITY WILL ALLOW.  NOTIFIED ATTENDING.

## 2013-11-25 NOTE — Progress Notes (Signed)
Pt. Walked 31ft. With  walker

## 2013-11-25 NOTE — Progress Notes (Signed)
ANTICOAGULATION CONSULT NOTE - Follow Up Consult  Pharmacy Consult for Coumadin Indication: atrial fibrillation  Allergies  Allergen Reactions  . Ace Inhibitors     unknown    Patient Measurements: Height: 5\' 9"  (175.3 cm) Weight: 147 lb 11.2 oz (66.996 kg) IBW/kg (Calculated) : 70.7  Vital Signs: Temp: 97.3 F (36.3 C) (12/11 0440) Temp src: Oral (12/11 0440) BP: 152/82 mmHg (12/11 0440) Pulse Rate: 76 (12/11 0440)  Labs:  Recent Labs  11/24/13 1846 11/24/13 1851 11/24/13 2006 11/24/13 2338 11/25/13 0355 11/25/13 0535  HGB 15.3  --   --   --   --  14.8  HCT 44.8  --   --   --   --  44.0  PLT 184  --   --   --   --  165  LABPROT  --   --  29.5*  --  25.1*  --   INR  --   --  2.93*  --  2.37*  --   CREATININE 1.26  --   --   --  0.97  --   CKTOTAL  --   --   --  156  --   --   TROPONINI  --  <0.30  --  <0.30 <0.30  --     Estimated Creatinine Clearance: 52.8 ml/min (by C-G formula based on Cr of 0.97).   Medications:  Warfarin 7.5mg  on Tuesdays and Fridays, 5mg  all other days  Assessment: 79 year old male admitted with chest pressure and lower extremity weakness.  He is on chronic anticoagulation with Coumadin for history of atrial fibrillation and his INR remains therapeutic.  Goal of Therapy:  INR 2-3   Plan:  Coumadin 5mg  today Daily PT/INR  Estella Husk, Pharm.D., BCPS, AAHIVP Clinical Pharmacist Phone: [phone] or [phone] 11/25/2013, 9:05 AM

## 2013-11-25 NOTE — Progress Notes (Signed)
Patient ID: Joseph Bentley, male   DOB: 01-01-1926, 79 y.o.   MRN: [id number] [facility] PROGRESS NOTE  Joseph Bentley WUJ:[id number] DOB: [birth year] DOA: 11/24/2013 PCP: Nicki Reaper, NP  Assessment/Plan: 1. Bilateral lower extremity weakness: Persistent and without pain. MRI brain, C spine, T spine show age related changes.  Will consult PT. 2. Chest pain: resolved. No further chest pain. 3. Afib: rate controled, continue coumadin 4. HTN: controled continue home regimen 5. History of seizure disorder: continue home regimen  Code Status: full Family Communication: spoke with patient and sister at the bedside Disposition Plan: will remain inpatient at this time   Consultants:  Consult PT today  Procedures:  Imaging as below  Antibiotics:  none  HPI/Subjective: 79 y.o. male with history of CAD status post CABG, seizures, hypertension, atrial fibrillation was brought to the ER after patient was complaining of chest pressure and 2 days of lower extremity weakness.  Today he denies any pain, shortness of breath.  He continues to feel that his legs are weak and he is afraid to fall.  On examination muscle strength is in tact.   Objective: Filed Vitals:   11/25/13 1300  BP: 124/67  Pulse: 71  Temp: 98.1 F (36.7 C)  Resp: 20    Intake/Output Summary (Last 24 hours) at 11/25/13 1431 Last data filed at 11/25/13 1300  Gross per 24 hour  Intake 541.83 ml  Output   1100 ml  Net -558.17 ml   Filed Weights   11/24/13 2119  Weight: 66.996 kg (147 lb 11.2 oz)    Exam:   General:  NAD, alert, calm  Cardiovascular: irreg, normal rate SEM 3/6  Respiratory: CTAB good air movement  Musculoskeletal: strength is 5/5 in all muscle groups on examination, there are no painful, warm or swollen joints  Data Reviewed: Basic Metabolic Panel:  Recent Labs Lab 11/22/13 0800 11/24/13 1846 11/25/13 0355  NA 134* 132* 132*  K 4.5 4.7 5.3*  CL 97 96 99  CO2 26 26 22    GLUCOSE 92 86 86  BUN 27* 26* 23  CREATININE 1.07 1.26 0.97  CALCIUM 9.3 8.7 8.7   Liver Function Tests:  Recent Labs Lab 11/25/13 0355  AST 27  ALT 9  ALKPHOS 128*  BILITOT 0.3  PROT 6.4  ALBUMIN 3.5   No results found for this basename: LIPASE, AMYLASE,  in the last 168 hours No results found for this basename: AMMONIA,  in the last 168 hours CBC:  Recent Labs Lab 11/22/13 0800 11/24/13 1846 11/25/13 0535  WBC 5.8 6.0 5.0  NEUTROABS 4.0 3.8 3.3  HGB 15.8 15.3 14.8  HCT 46.5 44.8 44.0  MCV 94.9 96.8 94.4  PLT 196 184 165   Cardiac Enzymes:  Recent Labs Lab 11/22/13 0800 11/24/13 1851 11/24/13 2338 11/25/13 0355 11/25/13 1055  CKTOTAL  --   --  156  --   --   TROPONINI <0.30 <0.30 <0.30 <0.30 <0.30   BNP (last 3 results)  Recent Labs  11/24/13 1851  PROBNP 326.2   CBG: No results found for this basename: GLUCAP,  in the last 168 hours  No results found for this or any previous visit (from the past 240 hour(s)).   Studies: Dg Chest 2 View  11/24/2013   CLINICAL DATA:  Weakness; hypertension  EXAM: CHEST  2 VIEW  COMPARISON:  January 07, 2013  FINDINGS: There is atelectatic change in the right base. Elsewhere lungs are clear. Heart size and pulmonary  vascularity are normal. No adenopathy. There is atherosclerotic change in the aorta. Patient is status post coronary artery bypass grafting.  IMPRESSION: Right base atelectasis. No edema or consolidation. Atherosclerotic change.   Electronically Signed   By: Bretta Bang M.D.   On: 11/24/2013 17:36   Ct Head Wo Contrast  11/24/2013   CLINICAL DATA:  Weakness and numbness in legs for 3-4 days.  EXAM: CT HEAD WITHOUT CONTRAST  TECHNIQUE: Contiguous axial images were obtained from the base of the skull through the vertex without intravenous contrast.  COMPARISON:  01/07/2013 CT.  FINDINGS: No intracranial hemorrhage.  Small vessel disease type changes without CT evidence of large acute infarct.  Global  atrophy without hydrocephalus.  No intracranial mass lesion noted on this unenhanced exam.  Vascular calcifications.  Minimal mucosal thickening maxillary sinuses and frontal sinuses with moderate mucosal thickening/ opacification ethmoid sinus air cells.  IMPRESSION: No intracranial hemorrhage.  Small vessel disease type changes without CT evidence of large acute infarct.  Global atrophy without hydrocephalus.  Minimal mucosal thickening maxillary sinuses and frontal sinuses with moderate mucosal thickening/ opacification ethmoid sinus air cells.   Electronically Signed   By: Bridgett Larsson M.D.   On: 11/24/2013 17:38   Mr Brain Wo Contrast  11/25/2013   CLINICAL DATA:  Slurred speech.  EXAM: MRI HEAD WITHOUT CONTRAST  TECHNIQUE: Multiplanar, multiecho pulse sequences of the brain and surrounding structures were obtained without intravenous contrast.  COMPARISON:  CT 11/24/2013.  MRI 01/06/2013  FINDINGS: Generalized atrophy. Chronic microvascular ischemic changes in the white matter and pons similar to the prior study.  Negative for acute infarct. Negative for mass or edema. No shift of the midline structures.  Scattered areas of chronic micro hemorrhage in the brain bilaterally similar to the prior MRI. These may be due to chronic hypertension.  Mucosal edema in the paranasal sinuses, mild  IMPRESSION: Atrophy and chronic microvascular ischemic change. Chronic foci of micro hemorrhage in the brain.  Negative for acute infarct.   Electronically Signed   By: Marlan Palau M.D.   On: 11/25/2013 11:14   Mr Cervical Spine Wo Contrast  11/25/2013   CLINICAL DATA:  Acute onset of profound weakness of the lower extremities.  EXAM: MRI CERVICAL SPINE WITHOUT CONTRAST  TECHNIQUE: Multiplanar, multisequence MR imaging was performed. No intravenous contrast was administered.  COMPARISON:  MRI of the brain dated 12/09/2011  FINDINGS: The visualized intracranial contents demonstrate chronic small vessel ischemic disease  in the pons, unchanged. The paraspinal soft tissues are normal.  C2-3: Moderately severe left facet arthritis. Tiny broad-based bulge of the disc without neural impingement.  C3-4: Disc space narrowing with broad-based small disc protrusion. Hypertrophy of the ligamentum flavum contributes to moderately severe cervical spinal stenosis to an AP dimension of 7 mm. No appreciable myelopathy. Both lateral recesses are narrowed. Moderate left and mild right facet arthritis with bilateral foraminal stenosis.  C4-5: Small broad-based disc protrusion without focal neural impingement. Moderate left facet arthritis and mild right facet arthritis. Left foraminal stenosis.  C5-6: Small disc bulges to the right and left of midline without neural impingement.  C6-7: Slight disc bulge and osteophytes into the left lateral recess and left neural foramen without neural impingement or significant foraminal stenosis.  C7-T1:  Small broad-based disc bulge without neural impingement.  T1-2:  Tiny central disc bulge.  IMPRESSION: 1. Moderately severe spinal stenosis at C3-4 due to a small broad-based disc protrusion and hypertrophy of the ligamentum flavum with bilateral foraminal  stenosis. No myelopathy of the cervical spinal cord. 2. Multilevel degenerative facet arthritis, most prominent from C2-3 through C4-5 on the left.   Electronically Signed   By: Geanie Cooley M.D.   On: 11/25/2013 11:08   Mr Thoracic Spine Wo Contrast  11/25/2013   CLINICAL DATA:  Acute onset of profound weakness in the legs.  EXAM: MRI THORACIC SPINE WITHOUT CONTRAST  TECHNIQUE: Multiplanar, multisequence MR imaging was performed. No intravenous contrast was administered.  COMPARISON:  None.  FINDINGS: The thoracic spinal cord is normal with no mass lesion or myelopathy or spinal cord compression. There is no spinal or foraminal stenosis in the thoracic spine. There are no mass lesions. There is no bone destruction.  There is an old healed compression  fracture of L1. There is a tiny Schmorl's node in the inferior endplate of T8. There are no disc protrusions or significant disc bulges. The paraspinal soft tissues appear normal.  IMPRESSION: No significant abnormality of the thoracic spine.   Electronically Signed   By: Geanie Cooley M.D.   On: 11/25/2013 11:20    Scheduled Meds: . hydrALAZINE  100 mg Oral TID  . labetalol  200 mg Oral TID  . levETIRAcetam  500 mg Oral BID  . pantoprazole  40 mg Oral BID  . phenytoin  200 mg Oral BID  . sodium chloride  3 mL Intravenous Q12H  . warfarin  5 mg Oral ONCE-1800  . Warfarin - Pharmacist Dosing Inpatient   Does not apply q1800   Continuous Infusions: . sodium chloride 10 mL/hr at 11/24/13 2349    Principal Problem:   Lower extremity weakness Active Problems:   Seizure   HTN (hypertension)   Atrial fibrillation   CAD (coronary artery disease)   B12 deficiency   Chest pain   Muscle weakness of lower extremity    Time spent: 30 minutes    [facility]  [facility] Pager [phone]. If 7PM-7AM, please contact night-coverage at [email or web address], password [facility] 11/25/2013, 2:31 PM  LOS: 1 day

## 2013-11-26 LAB — BASIC METABOLIC PANEL
BUN: 24 mg/dL — ABNORMAL HIGH (ref 6–23)
CO2: 26 mEq/L (ref 19–32)
Calcium: 9 mg/dL (ref 8.4–10.5)
GFR calc non Af Amer: 56 mL/min — ABNORMAL LOW (ref >90)
Glucose, Bld: 97 mg/dL (ref 70–99)

## 2013-11-26 LAB — CBC
HCT: 46 % (ref 39.0–52.0)
Hemoglobin: 15.8 g/dL (ref 13.0–17.0)
MCH: 32.8 pg (ref 26.0–34.0)
MCHC: 34.3 g/dL (ref 30.0–36.0)
RDW: 12.8 % (ref 11.5–15.5)

## 2013-11-26 LAB — PROTIME-INR: Prothrombin Time: 28.8 seconds — ABNORMAL HIGH (ref 11.6–15.2)

## 2013-11-26 LAB — JO-1 ANTIBODY-IGG: Jo-1 Antibody, IgG: <1 AU/mL (ref <30)

## 2013-11-26 MED ORDER — WARFARIN SODIUM 5 MG PO TABS
5.0000 mg | ORAL_TABLET | Freq: Once | ORAL | Status: AC
  Administered 2013-11-26: 5 mg via ORAL
  Filled 2013-11-26: qty 1

## 2013-11-26 NOTE — Evaluation (Signed)
Physical Therapy Evaluation Patient Details Name: WACO FOERSTER MRN: [id number] DOB: Mar 17, 1926 Today's Date: 11/26/2013 Time: 0752-0809 PT Time Calculation (min): 17 min  PT Assessment / Plan / Recommendation History of Present Illness  Pt adm with chest pain and bilateral lower extremity weakness.  Work-up revealed age related changes and no acute events.  Clinical Impression  Pt admitted with above. Pt currently with functional limitations due to the deficits listed below (see PT Problem List).  Pt will benefit from skilled PT to increase their independence and safety with mobility before return to ALF.  Pt currently needs assist for all OOB mobility. Feel pt needs ST-SNF.  Pt agreeable and prefers [facility].      PT Assessment  Patient needs continued PT services    Follow Up Recommendations  SNF    Does the patient have the potential to tolerate intense rehabilitation      Barriers to Discharge Decreased caregiver support      Equipment Recommendations  None recommended by PT    Recommendations for Other Services     Frequency Min 3X/week    Precautions / Restrictions Precautions Precautions: Fall   Pertinent Vitals/Pain No c/o's      Mobility  Bed Mobility Bed Mobility: Supine to Sit;Sitting - Scoot to Edge of Bed Supine to Sit: 6: Modified independent (Device/Increase time);HOB elevated;With rails Sitting - Scoot to Edge of Bed: 6: Modified independent (Device/Increase time) Details for Bed Mobility Assistance: Incr time and effort required. Transfers Transfers: Sit to Stand;Stand to Sit Sit to Stand: 4: Min assist;With upper extremity assist;From bed Stand to Sit: 4: Min assist;Without upper extremity assist;To chair/3-in-1 Details for Transfer Assistance: Verbal cues for hand placement.  Assist for balance coming to stand and assist to control descent.  Pt with posterior lean coming up to stand. Ambulation/Gait Ambulation/Gait Assistance: 3: Mod  assist Ambulation Distance (Feet): 40 Feet Assistive device: Rolling walker Ambulation/Gait Assistance Details: Verbal/tactile cues to stand more erect and stay closer to walker. Gait Pattern: Step-through pattern;Decreased step length - right;Decreased step length - left;Shuffle;Trunk flexed;Right flexed knee in stance;Left flexed knee in stance;Ataxic Gait velocity: slow    Exercises     PT Diagnosis: Difficulty walking;Generalized weakness;Abnormality of gait  PT Problem List: Decreased strength;Decreased activity tolerance;Decreased balance;Decreased mobility;Decreased coordination;Decreased knowledge of use of DME;Decreased safety awareness PT Treatment Interventions: DME instruction;Gait training;Functional mobility training;Therapeutic activities;Therapeutic exercise;Balance training;Patient/family education     PT Goals(Current goals can be found in the care plan section) Acute Rehab PT Goals Patient Stated Goal: Pt would like to return to [facility] to get stronger. PT Goal Formulation: With patient Time For Goal Achievement: 12/03/13 Potential to Achieve Goals: Good  Visit Information  Last PT Received On: 11/26/13 Assistance Needed: +1 History of Present Illness: Pt adm with chest pain and bilateral lower extremity weakness.  Work-up revealed age related changes and no acute events.       Prior Functioning  Home Living Family/patient expects to be discharged to:: Assisted living Home Equipment: Dan Humphreys - 2 wheels;Wheelchair - manual Additional Comments: [facility] assisted living. Prior Function Level of Independence: Needs assistance Gait / Transfers Assistance Needed: Pt has been able to amb with rolling walker modified independent at [facility] until a few days ago.   Communication Communication: HOH Dominant Hand: Right    Cognition  Cognition Arousal/Alertness: Awake/alert Behavior During Therapy: WFL for tasks assessed/performed Overall  Cognitive Status: Within Functional Limits for tasks assessed    Extremity/Trunk Assessment Upper Extremity Assessment  Upper Extremity Assessment: Generalized weakness Lower Extremity Assessment Lower Extremity Assessment: LLE deficits/detail;RLE deficits/detail RLE Deficits / Details: grossly 3+/5 RLE Coordination: decreased gross motor LLE Deficits / Details: grossly 3+/5 LLE Coordination: decreased gross motor   Balance Balance Balance Assessed: Yes Static Standing Balance Static Standing - Balance Support: Bilateral upper extremity supported Static Standing - Level of Assistance: 4: Min assist  End of Session PT - End of Session Equipment Utilized During Treatment: Gait belt Activity Tolerance: Patient limited by fatigue Patient left: in chair;with call bell/phone within reach Nurse Communication: Mobility status  GP     Talise Sligh 11/26/2013, 8:24 AM  Skip Mayer PT [phone]

## 2013-11-26 NOTE — Clinical Social Work Placement (Signed)
Clinical Social Work Department CLINICAL SOCIAL WORK PLACEMENT NOTE 11/26/2013  Patient:  Joseph Bentley,Joseph Bentley  Account Number:  [id number] Admit date:  11/24/2013  Clinical Social Worker:  Evely Gainey, LCSWA  Date/time:  11/26/2013 02:03 PM  Clinical Social Work is seeking post-discharge placement for this patient at the following level of care:   SKILLED NURSING   (*CSW will update this form in Epic as items are completed)   11/26/2013  Patient/family provided with [facility] Department of Clinical Social Work's list of facilities offering this level of care within the geographic area requested by the patient (or if unable, by the patient's family).  11/26/2013  Patient/family informed of their freedom to choose among providers that offer the needed level of care, that participate in Medicare, Medicaid or managed care program needed by the patient, have an available bed and are willing to accept the patient.  11/26/2013  Patient/family informed of [facility]' ownership interest in [facility], as well as of the fact that they are under no obligation to receive care at this facility.  PASARR submitted to EDS on  PASARR number received from EDS on   FL2 transmitted to all facilities in geographic area requested by pt/family on  11/26/2013 FL2 transmitted to all facilities within larger geographic area on   Patient informed that his/her managed care company has contracts with or will negotiate with  certain facilities, including the following:     Patient/family informed of bed offers received:   Patient chooses bed at  Physician recommends and patient chooses bed at    Patient to be transferred to  on   Patient to be transferred to facility by   The following physician request were entered in Epic:   Additional Comments: Pt PASARR is exisiting ([facility] 11/26/2013)  Gina Chibuike Fleek, LCSWA [phone]  Clinical Social Work  

## 2013-11-26 NOTE — Clinical Social Work Psychosocial (Signed)
Clinical Social Work Department BRIEF PSYCHOSOCIAL ASSESSMENT 11/26/2013  Patient:  Joseph Bentley, Joseph Bentley     Account Number:  [id number]     Admit date:  11/24/2013  Clinical Social Worker:  Read Drivers  Date/Time:  11/26/2013 01:52 PM  Referred by:  Care Management  Date Referred:  11/26/2013 Referred for  SNF Placement   Other Referral:   none   Interview type:  Other - See comment Other interview type:   son, [name] [phone]    PSYCHOSOCIAL DATA Living Status:  FACILITY Admitted from facility:  [facility] Level of care:  Independent Living Primary support name:  Corian [name]) Primary support relationship to patient:  CHILD, ADULT Degree of support available:   strong    CURRENT CONCERNS Current Concerns  Post-Acute Placement   Other Concerns:   none    SOCIAL WORK ASSESSMENT / PLAN CSW was consulted for SNF placement.  Son [name] and pt report wishes to return to [facility] for STR.  CSW will facillitate the request.  Pt is from [facility] and no longer drives.  Pt appears to have a strong support system with son at bedside.  Expected discharge - weekend/Monday.   Assessment/plan status:  Psychosocial Support/Ongoing Assessment of Needs Other assessment/ plan:   none   Information/referral to community resources:   SNF    PATIENT'S/FAMILY'S RESPONSE TO PLAN OF CARE: Pt and family was appreciative of CSW assistance and support.       Vickii Penna, LCSWA [phone]  Clinical Social Work

## 2013-11-26 NOTE — Progress Notes (Signed)
Patient ID: Joseph Bentley, male   DOB: 01/21/1926, 79 y.o.   MRN: [id number] [facility] PROGRESS NOTE  ASHOK SAWAYA YNW:[id number] DOB: 05/27/1926 DOA: 11/24/2013 PCP: Nicki Reaper, NP  Assessment/Plan: 1. Bilateral lower extremity weakness: Persistent and without pain. MRI brain, C spine, T spine show age related changes.  PT has recommended SNF. Will work towards placement 2. Chest pain: resolved. No further chest pain. 3. Afib: rate controled, continue coumadin 4. HTN: controled continue home regimen 5. History of seizure disorder: continue home regimen  Code Status: full Family Communication: spoke with patient  Disposition Plan: will remain inpatient at this time    Consultants: PT  Procedures:  Imaging as below  Antibiotics:  none  HPI/Subjective: 79 y.o. male with history of CAD status post CABG, seizures, hypertension, atrial fibrillation was brought to the ER after patient was complaining of chest pressure and 2 days of lower extremity weakness.  Today he denies any pain, shortness of breath.  He continues to feel that his legs are weak and he is afraid to fall.  On examination muscle strength is in tact. PT reports that he has a significant gait abnormality and needs SNF placement   Objective: Filed Vitals:   11/26/13 0456  BP: 156/83  Pulse: 64  Temp: 97.6 F (36.4 C)  Resp: 18    Intake/Output Summary (Last 24 hours) at 11/26/13 1457 Last data filed at 11/26/13 0900  Gross per 24 hour  Intake    480 ml  Output    300 ml  Net    180 ml   Filed Weights   11/24/13 2119  Weight: 66.996 kg (147 lb 11.2 oz)    Exam:   General:  NAD, alert, calm  Cardiovascular: irreg, normal rate SEM 3/6  Respiratory: CTAB good air movement  Musculoskeletal: strength is 5/5 in all muscle groups on examination, there are no painful, warm or swollen joints  Data Reviewed: Basic Metabolic Panel:  Recent Labs Lab 11/22/13 0800 11/24/13 1846  11/25/13 0355 11/26/13 0505  NA 134* 132* 132* 134*  K 4.5 4.7 5.3* 4.8  CL 97 96 99 98  CO2 26 26 22 26   GLUCOSE 92 86 86 97  BUN 27* 26* 23 24*  CREATININE 1.07 1.26 0.97 1.16  CALCIUM 9.3 8.7 8.7 9.0   Liver Function Tests:  Recent Labs Lab 11/25/13 0355  AST 27  ALT 9  ALKPHOS 128*  BILITOT 0.3  PROT 6.4  ALBUMIN 3.5   No results found for this basename: LIPASE, AMYLASE,  in the last 168 hours No results found for this basename: AMMONIA,  in the last 168 hours CBC:  Recent Labs Lab 11/22/13 0800 11/24/13 1846 11/25/13 0535 11/26/13 0505  WBC 5.8 6.0 5.0 6.1  NEUTROABS 4.0 3.8 3.3  --   HGB 15.8 15.3 14.8 15.8  HCT 46.5 44.8 44.0 46.0  MCV 94.9 96.8 94.4 95.6  PLT 196 184 165 167   Cardiac Enzymes:  Recent Labs Lab 11/22/13 0800 11/24/13 1851 11/24/13 2338 11/25/13 0355 11/25/13 1055  CKTOTAL  --   --  156  --   --   TROPONINI <0.30 <0.30 <0.30 <0.30 <0.30   BNP (last 3 results)  Recent Labs  11/24/13 1851  PROBNP 326.2   CBG: No results found for this basename: GLUCAP,  in the last 168 hours  Recent Results (from the past 240 hour(s))  URINE CULTURE     Status: None   Collection Time  11/24/13  4:50 PM      Result Value Range Status   Specimen Description URINE, CLEAN CATCH   Final   Special Requests NONE   Final   Culture  Setup Time     Final   Value: 11/24/2013 21:07     Performed at [facility]   [place] Count     Final   Value: NO GROWTH     Performed at [facility]   Culture     Final   Value: NO GROWTH     Performed at [facility]   Report Status 11/25/2013 FINAL   Final     Studies: Dg Chest 2 View  11/24/2013   CLINICAL DATA:  Weakness; hypertension  EXAM: CHEST  2 VIEW  COMPARISON:  January 07, 2013  FINDINGS: There is atelectatic change in the right base. Elsewhere lungs are clear. Heart size and pulmonary vascularity are normal. No adenopathy. There is atherosclerotic change in the aorta.  Patient is status post coronary artery bypass grafting.  IMPRESSION: Right base atelectasis. No edema or consolidation. Atherosclerotic change.   Electronically Signed   By: Bretta Bang M.D.   On: 11/24/2013 17:36   Ct Head Wo Contrast  11/24/2013   CLINICAL DATA:  Weakness and numbness in legs for 3-4 days.  EXAM: CT HEAD WITHOUT CONTRAST  TECHNIQUE: Contiguous axial images were obtained from the base of the skull through the vertex without intravenous contrast.  COMPARISON:  01/07/2013 CT.  FINDINGS: No intracranial hemorrhage.  Small vessel disease type changes without CT evidence of large acute infarct.  Global atrophy without hydrocephalus.  No intracranial mass lesion noted on this unenhanced exam.  Vascular calcifications.  Minimal mucosal thickening maxillary sinuses and frontal sinuses with moderate mucosal thickening/ opacification ethmoid sinus air cells.  IMPRESSION: No intracranial hemorrhage.  Small vessel disease type changes without CT evidence of large acute infarct.  Global atrophy without hydrocephalus.  Minimal mucosal thickening maxillary sinuses and frontal sinuses with moderate mucosal thickening/ opacification ethmoid sinus air cells.   Electronically Signed   By: Bridgett Larsson M.D.   On: 11/24/2013 17:38   Mr Brain Wo Contrast  11/25/2013   CLINICAL DATA:  Slurred speech.  EXAM: MRI HEAD WITHOUT CONTRAST  TECHNIQUE: Multiplanar, multiecho pulse sequences of the brain and surrounding structures were obtained without intravenous contrast.  COMPARISON:  CT 11/24/2013.  MRI 01/06/2013  FINDINGS: Generalized atrophy. Chronic microvascular ischemic changes in the white matter and pons similar to the prior study.  Negative for acute infarct. Negative for mass or edema. No shift of the midline structures.  Scattered areas of chronic micro hemorrhage in the brain bilaterally similar to the prior MRI. These may be due to chronic hypertension.  Mucosal edema in the paranasal sinuses, mild   IMPRESSION: Atrophy and chronic microvascular ischemic change. Chronic foci of micro hemorrhage in the brain.  Negative for acute infarct.   Electronically Signed   By: Marlan Palau M.D.   On: 11/25/2013 11:14   Mr Cervical Spine Wo Contrast  11/25/2013   CLINICAL DATA:  Acute onset of profound weakness of the lower extremities.  EXAM: MRI CERVICAL SPINE WITHOUT CONTRAST  TECHNIQUE: Multiplanar, multisequence MR imaging was performed. No intravenous contrast was administered.  COMPARISON:  MRI of the brain dated 12/09/2011  FINDINGS: The visualized intracranial contents demonstrate chronic small vessel ischemic disease in the pons, unchanged. The paraspinal soft tissues are normal.  C2-3: Moderately severe left facet arthritis. Tiny  broad-based bulge of the disc without neural impingement.  C3-4: Disc space narrowing with broad-based small disc protrusion. Hypertrophy of the ligamentum flavum contributes to moderately severe cervical spinal stenosis to an AP dimension of 7 mm. No appreciable myelopathy. Both lateral recesses are narrowed. Moderate left and mild right facet arthritis with bilateral foraminal stenosis.  C4-5: Small broad-based disc protrusion without focal neural impingement. Moderate left facet arthritis and mild right facet arthritis. Left foraminal stenosis.  C5-6: Small disc bulges to the right and left of midline without neural impingement.  C6-7: Slight disc bulge and osteophytes into the left lateral recess and left neural foramen without neural impingement or significant foraminal stenosis.  C7-T1:  Small broad-based disc bulge without neural impingement.  T1-2:  Tiny central disc bulge.  IMPRESSION: 1. Moderately severe spinal stenosis at C3-4 due to a small broad-based disc protrusion and hypertrophy of the ligamentum flavum with bilateral foraminal stenosis. No myelopathy of the cervical spinal cord. 2. Multilevel degenerative facet arthritis, most prominent from C2-3 through C4-5 on  the left.   Electronically Signed   By: Geanie Cooley M.D.   On: 11/25/2013 11:08   Mr Thoracic Spine Wo Contrast  11/25/2013   CLINICAL DATA:  Acute onset of profound weakness in the legs.  EXAM: MRI THORACIC SPINE WITHOUT CONTRAST  TECHNIQUE: Multiplanar, multisequence MR imaging was performed. No intravenous contrast was administered.  COMPARISON:  None.  FINDINGS: The thoracic spinal cord is normal with no mass lesion or myelopathy or spinal cord compression. There is no spinal or foraminal stenosis in the thoracic spine. There are no mass lesions. There is no bone destruction.  There is an old healed compression fracture of L1. There is a tiny Schmorl's node in the inferior endplate of T8. There are no disc protrusions or significant disc bulges. The paraspinal soft tissues appear normal.  IMPRESSION: No significant abnormality of the thoracic spine.   Electronically Signed   By: Geanie Cooley M.D.   On: 11/25/2013 11:20    Scheduled Meds: . hydrALAZINE  100 mg Oral TID  . labetalol  200 mg Oral TID  . levETIRAcetam  500 mg Oral BID  . pantoprazole  40 mg Oral BID  . phenytoin  200 mg Oral BID  . sodium chloride  3 mL Intravenous Q12H  . warfarin  5 mg Oral ONCE-1800  . Warfarin - Pharmacist Dosing Inpatient   Does not apply q1800   Continuous Infusions: . sodium chloride 10 mL/hr at 11/24/13 2349    Principal Problem:   Lower extremity weakness Active Problems:   Seizure   HTN (hypertension)   Atrial fibrillation   CAD (coronary artery disease)   B12 deficiency   Chest pain   Muscle weakness of lower extremity    Time spent: 30 minutes    [facility]  [facility] Pager [phone]. If 7PM-7AM, please contact night-coverage at [email or web address], password [facility] 11/26/2013, 2:57 PM  LOS: 2 days

## 2013-11-26 NOTE — Progress Notes (Signed)
Nutrition Brief Note  Patient identified on the Malnutrition Screening Tool (MST) Report for recent weight lost without trying and eating poorly because of a decreased appetite.  Per readings below, patient's weight has been stable.  Wt Readings from Last 15 Encounters:  11/24/13 147 lb 11.2 oz (66.996 kg)  08/20/13 149 lb 4 oz (67.699 kg)  07/28/13 151 lb 12.8 oz (68.856 kg)  01/07/13 148 lb 9.4 oz (67.4 kg)  01/14/12 160 lb (72.576 kg)  12/09/11 152 lb 8.9 oz (69.2 kg)    Body mass index is 21.8 kg/(m^2). Patient meets criteria for Normal based on current BMI.   Current diet order is Heart Healthy, patient is consuming approximately 100% of meals at this time. Labs and medications reviewed.   No nutrition interventions warranted at this time. If nutrition issues arise, please consult RD.   Maureen Chatters, RD, LDN Pager #: [phone] After-Hours Pager #: [phone]

## 2013-11-26 NOTE — Discharge Summary (Signed)
Physician Discharge Summary  Joseph Bentley ZOX:[id number] DOB: [birth year] DOA: 11/24/2013  PCP: Nicki Reaper, NP  Admit date: 11/24/2013 Discharge date: 11/27/2013  Time spent: 45 minutes  Recommendations for Outpatient Follow-up:  1. Discharge to SNF for physical therapy rehabilitation to improve strength and balance. 2. Follow up with PCP in two weeks.   Discharge Diagnoses:  Principal Problem:   Lower extremity weakness Active Problems:   Seizure   HTN (hypertension)   Atrial fibrillation   CAD (coronary artery disease)   B12 deficiency   Chest pain   Muscle weakness of lower extremity   Discharge Condition: good  Diet recommendation: heart healthy  Filed Weights   11/24/13 2119  Weight: 66.996 kg (147 lb 11.2 oz)    History of present illness:  Joseph Bentley is a 79 y.o. male with history of CAD status post CABG, seizures, hypertension, atrial fibrillation was brought to the ER on 12/10 after patient was complaining of chest pressure. Patient in addition was noticed to have some slurred speech when he was talking to his son-in-law earlier today. Patient was last 2 days has been having profound weakness that he was unable to walk or put any pressure on his lower extremities. Patient usually walks with help of walker but over the last 2 days he has been using wheelchair. Denies any pain in the lower extremities. Denies any tingling numbness. Patient has some chronic incontinence of urine which has not worsened. Patient's chest pain lasted for around 15 minutes was pressure-like nonradiating and was relieved by itself. In the ER EKG and cardiac markers were negative for anything acute. CT head did not show anything acute patient has been admitted for further management. Presently patient is chest pain-free. On exam patient has good strength in both lower extremities but when trying to make him walk he is not able to.    Hospital Course:  1) lower extremity weakness:   Neurology was consulted and recommended MRI of the brain, cervical and thoracic spine.  These studies are described in detail below, they did not show an obvious lesion that would cause bilateral lower extremity weakness. There is spinal stenosis at C3-4 with nerve root compression but no spinal cord compression.  This would not explain his lower extremity symptoms. Labs to evaluate for myositis including CK, ANA, Jo-1 antibody were all negative.  An aldolase is pending. UA was normal. In hospital [name] continued to have normal strength on examination, but difficulty with gait.  Physical therapy recommendation is for SNF, patient and family are in agreement.  He is discharge to SNF for rehabilitation. 2) slurring of speech: MRI showed no evidence of infarct and symptoms were not present during admission. Recent ECHO is unremarkable, no events on telemetry.   3) hypertension: controled in hospital on home regimen 4) atrial fibrillation: rate controled, anticoagulated 5) CAD: no chest pain during admission. Continue with BB. 6) seizure disorder: asymptomatic. Continue with home regimen 7) testosterone deficiency: no changes made to home regimen  Procedures:  Imaging as below  Consultations:  none  Discharge Exam: Filed Vitals:   11/27/13 0419  BP: 122/69  Pulse: 71  Temp: 98.1 F (36.7 C)  Resp: 18    General: alert, oriented, comfortable Cardiovascular: irreg, normal rate, no mrg Respiratory: CTAB good air movement ABD: bs + soft, non tender MSK: no tender or swollen joints, strength 5/5 BLE's on examination  Discharge Instructions      Discharge Orders   Future Appointments Provider  Department Dept Phone   12/07/2013 11:00 AM [facility] [phone]   01/18/2014 11:15 AM Peter M [place], MD [facility] [place] Office [phone]   Future Orders Complete By Expires   Call MD for:  persistant dizziness or  light-headedness  As directed    Call MD for:  temperature >100.4  As directed    Diet - low sodium heart healthy  As directed    Discharge instructions  As directed    Comments:     Patient is discharged to SNF for intensive physical therapy to improve strength and balance.   Increase activity slowly  As directed    Other Restrictions  As directed    Comments:     You should not walk unassisted.       Medication List         hydrALAZINE 100 MG tablet  Commonly known as:  APRESOLINE  Take 100 mg by mouth 3 (three) times daily.     labetalol 200 MG tablet  Commonly known as:  NORMODYNE  Take 1 tablet (200 mg total) by mouth 3 (three) times daily.     levETIRAcetam 500 MG tablet  Commonly known as:  KEPPRA  Take 500 mg by mouth 2 (two) times daily.     pantoprazole 40 MG tablet  Commonly known as:  PROTONIX  Take 40 mg by mouth 2 (two) times daily.     phenytoin 100 MG ER capsule  Commonly known as:  DILANTIN  Take 200 mg by mouth 2 (two) times daily.     testosterone cypionate 200 MG/ML injection  Commonly known as:  DEPOTESTOTERONE CYPIONATE  Inject 1.5 mLs (300 mg total) into the muscle every 28 (twenty-eight) days.     Vitamin D (Ergocalciferol) 50000 UNITS Caps capsule  Commonly known as:  DRISDOL  Take 50,000 Units by mouth every 7 (seven) days. On Tuesday     warfarin 5 MG tablet  Commonly known as:  COUMADIN  Take 5-7.5 mg by mouth daily. Takes 7.5 mg on Tuesday and Friday and 5 mg all other days       Allergies  Allergen Reactions  . Ace Inhibitors     unknown   Follow-up Information   Follow up with BAITY, REGINA, NP In 2 weeks.   Specialty:  Internal Medicine   Contact information:   [address] [place] [address] [phone]        The results of significant diagnostics from this hospitalization (including imaging, microbiology, ancillary and laboratory) are listed below for reference.    Significant Diagnostic Studies: Dg Chest 2  View  11/24/2013   CLINICAL DATA:  Weakness; hypertension  EXAM: CHEST  2 VIEW  COMPARISON:  January 07, 2013  FINDINGS: There is atelectatic change in the right base. Elsewhere lungs are clear. Heart size and pulmonary vascularity are normal. No adenopathy. There is atherosclerotic change in the aorta. Patient is status post coronary artery bypass grafting.  IMPRESSION: Right base atelectasis. No edema or consolidation. Atherosclerotic change.   Electronically Signed   By: Bretta Bang M.D.   On: 11/24/2013 17:36   Ct Head Wo Contrast  11/24/2013   CLINICAL DATA:  Weakness and numbness in legs for 3-4 days.  EXAM: CT HEAD WITHOUT CONTRAST  TECHNIQUE: Contiguous axial images were obtained from the base of the skull through the vertex without intravenous contrast.  COMPARISON:  01/07/2013 CT.  FINDINGS: No intracranial hemorrhage.  Small  vessel disease type changes without CT evidence of large acute infarct.  Global atrophy without hydrocephalus.  No intracranial mass lesion noted on this unenhanced exam.  Vascular calcifications.  Minimal mucosal thickening maxillary sinuses and frontal sinuses with moderate mucosal thickening/ opacification ethmoid sinus air cells.  IMPRESSION: No intracranial hemorrhage.  Small vessel disease type changes without CT evidence of large acute infarct.  Global atrophy without hydrocephalus.  Minimal mucosal thickening maxillary sinuses and frontal sinuses with moderate mucosal thickening/ opacification ethmoid sinus air cells.   Electronically Signed   By: Bridgett Larsson M.D.   On: 11/24/2013 17:38   Mr Brain Wo Contrast  11/25/2013   CLINICAL DATA:  Slurred speech.  EXAM: MRI HEAD WITHOUT CONTRAST  TECHNIQUE: Multiplanar, multiecho pulse sequences of the brain and surrounding structures were obtained without intravenous contrast.  COMPARISON:  CT 11/24/2013.  MRI 01/06/2013  FINDINGS: Generalized atrophy. Chronic microvascular ischemic changes in the white matter and  pons similar to the prior study.  Negative for acute infarct. Negative for mass or edema. No shift of the midline structures.  Scattered areas of chronic micro hemorrhage in the brain bilaterally similar to the prior MRI. These may be due to chronic hypertension.  Mucosal edema in the paranasal sinuses, mild  IMPRESSION: Atrophy and chronic microvascular ischemic change. Chronic foci of micro hemorrhage in the brain.  Negative for acute infarct.   Electronically Signed   By: Marlan Palau M.D.   On: 11/25/2013 11:14   Mr Cervical Spine Wo Contrast  11/25/2013   CLINICAL DATA:  Acute onset of profound weakness of the lower extremities.  EXAM: MRI CERVICAL SPINE WITHOUT CONTRAST  TECHNIQUE: Multiplanar, multisequence MR imaging was performed. No intravenous contrast was administered.  COMPARISON:  MRI of the brain dated 12/09/2011  FINDINGS: The visualized intracranial contents demonstrate chronic small vessel ischemic disease in the pons, unchanged. The paraspinal soft tissues are normal.  C2-3: Moderately severe left facet arthritis. Tiny broad-based bulge of the disc without neural impingement.  C3-4: Disc space narrowing with broad-based small disc protrusion. Hypertrophy of the ligamentum flavum contributes to moderately severe cervical spinal stenosis to an AP dimension of 7 mm. No appreciable myelopathy. Both lateral recesses are narrowed. Moderate left and mild right facet arthritis with bilateral foraminal stenosis.  C4-5: Small broad-based disc protrusion without focal neural impingement. Moderate left facet arthritis and mild right facet arthritis. Left foraminal stenosis.  C5-6: Small disc bulges to the right and left of midline without neural impingement.  C6-7: Slight disc bulge and osteophytes into the left lateral recess and left neural foramen without neural impingement or significant foraminal stenosis.  C7-T1:  Small broad-based disc bulge without neural impingement.  T1-2:  Tiny central disc  bulge.  IMPRESSION: 1. Moderately severe spinal stenosis at C3-4 due to a small broad-based disc protrusion and hypertrophy of the ligamentum flavum with bilateral foraminal stenosis. No myelopathy of the cervical spinal cord. 2. Multilevel degenerative facet arthritis, most prominent from C2-3 through C4-5 on the left.   Electronically Signed   By: Geanie Cooley M.D.   On: 11/25/2013 11:08   Mr Thoracic Spine Wo Contrast  11/25/2013   CLINICAL DATA:  Acute onset of profound weakness in the legs.  EXAM: MRI THORACIC SPINE WITHOUT CONTRAST  TECHNIQUE: Multiplanar, multisequence MR imaging was performed. No intravenous contrast was administered.  COMPARISON:  None.  FINDINGS: The thoracic spinal cord is normal with no mass lesion or myelopathy or spinal cord compression. There is no  spinal or foraminal stenosis in the thoracic spine. There are no mass lesions. There is no bone destruction.  There is an old healed compression fracture of L1. There is a tiny Schmorl's node in the inferior endplate of T8. There are no disc protrusions or significant disc bulges. The paraspinal soft tissues appear normal.  IMPRESSION: No significant abnormality of the thoracic spine.   Electronically Signed   By: Geanie Cooley M.D.   On: 11/25/2013 11:20    Microbiology: Recent Results (from the past 240 hour(s))  URINE CULTURE     Status: None   Collection Time    11/24/13  4:50 PM      Result Value Range Status   Specimen Description URINE, CLEAN CATCH   Final   Special Requests NONE   Final   Culture  Setup Time     Final   Value: 11/24/2013 21:07     Performed at [facility] Count     Final   Value: NO GROWTH     Performed at [facility]   Culture     Final   Value: NO GROWTH     Performed at [facility]   Report Status 11/25/2013 FINAL   Final     Labs: Basic Metabolic Panel:  Recent Labs Lab 11/22/13 0800 11/24/13 1846 11/25/13 0355 11/26/13 0505 11/27/13 0445   NA 134* 132* 132* 134* 135  K 4.5 4.7 5.3* 4.8 4.3  CL 97 96 99 98 101  CO2 26 26 22 26 25   GLUCOSE 92 86 86 97 97  BUN 27* 26* 23 24* 24*  CREATININE 1.07 1.26 0.97 1.16 0.96  CALCIUM 9.3 8.7 8.7 9.0 8.6   Liver Function Tests:  Recent Labs Lab 11/25/13 0355  AST 27  ALT 9  ALKPHOS 128*  BILITOT 0.3  PROT 6.4  ALBUMIN 3.5   No results found for this basename: LIPASE, AMYLASE,  in the last 168 hours No results found for this basename: AMMONIA,  in the last 168 hours CBC:  Recent Labs Lab 11/22/13 0800 11/24/13 1846 11/25/13 0535 11/26/13 0505 11/27/13 0445  WBC 5.8 6.0 5.0 6.1 6.1  NEUTROABS 4.0 3.8 3.3  --   --   HGB 15.8 15.3 14.8 15.8 15.5  HCT 46.5 44.8 44.0 46.0 45.6  MCV 94.9 96.8 94.4 95.6 95.8  PLT 196 184 165 167 163   Cardiac Enzymes:  Recent Labs Lab 11/22/13 0800 11/24/13 1851 11/24/13 2338 11/25/13 0355 11/25/13 1055  CKTOTAL  --   --  156  --   --   TROPONINI <0.30 <0.30 <0.30 <0.30 <0.30   BNP: BNP (last 3 results)  Recent Labs  11/24/13 1851  PROBNP 326.2   CBG: No results found for this basename: GLUCAP,  in the last 168 hours     Signed:  Myli Pae  [facility] 11/27/2013, 8:05 AM

## 2013-11-26 NOTE — Progress Notes (Signed)
ANTICOAGULATION CONSULT NOTE - Follow Up Consult  Pharmacy Consult for Coumadin Indication: atrial fibrillation  Allergies  Allergen Reactions  . Ace Inhibitors     unknown    Patient Measurements: Height: 5\' 9"  (175.3 cm) Weight: 147 lb 11.2 oz (66.996 kg) IBW/kg (Calculated) : 70.7  Vital Signs: Temp: 97.6 F (36.4 C) (12/12 0456) Temp src: Oral (12/12 0456) BP: 156/83 mmHg (12/12 0456) Pulse Rate: 64 (12/12 0456)  Labs:  Recent Labs  11/24/13 1846  11/24/13 2006 11/24/13 2338 11/25/13 0355 11/25/13 0535 11/25/13 1055 11/26/13 0505  HGB 15.3  --   --   --   --  14.8  --  15.8  HCT 44.8  --   --   --   --  44.0  --  46.0  PLT 184  --   --   --   --  165  --  167  LABPROT  --   --  29.5*  --  25.1*  --   --  28.8*  INR  --   --  2.93*  --  2.37*  --   --  2.83*  CREATININE 1.26  --   --   --  0.97  --   --  1.16  CKTOTAL  --   --   --  156  --   --   --   --   TROPONINI  --   < >  --  <0.30 <0.30  --  <0.30  --   < > = values in this interval not displayed.  Estimated Creatinine Clearance: 44.1 ml/min (by C-G formula based on Cr of 1.16).  Assessment: 87yom continues on coumadin for afib. INR remains therapeutic. CBC stable. No bleeding reported.  Home dose: 5mg  daily except 7.5mg  Tues/Fri  Goal of Therapy:  INR 2-3 Monitor platelets by anticoagulation protocol: Yes   Plan:  1) Coumadin 5mg  x 1 tonight 2) INR in AM  Fredrik Rigger 11/26/2013,9:20 AM

## 2013-11-27 LAB — CBC
Hemoglobin: 15.5 g/dL (ref 13.0–17.0)
MCH: 32.6 pg (ref 26.0–34.0)
MCHC: 34 g/dL (ref 30.0–36.0)
Platelets: 163 10*3/uL (ref 150–400)
RBC: 4.76 MIL/uL (ref 4.22–5.81)

## 2013-11-27 LAB — BASIC METABOLIC PANEL
Calcium: 8.6 mg/dL (ref 8.4–10.5)
Chloride: 101 mEq/L (ref 96–112)
GFR calc Af Amer: 85 mL/min — ABNORMAL LOW (ref >90)
GFR calc non Af Amer: 73 mL/min — ABNORMAL LOW (ref >90)
Glucose, Bld: 97 mg/dL (ref 70–99)
Potassium: 4.3 mEq/L (ref 3.5–5.1)
Sodium: 135 mEq/L (ref 135–145)

## 2013-11-27 LAB — PROTIME-INR
INR: 2.95 — ABNORMAL HIGH (ref 0.00–1.49)
Prothrombin Time: 29.7 seconds — ABNORMAL HIGH (ref 11.6–15.2)

## 2013-11-27 MED ORDER — WARFARIN SODIUM 2.5 MG PO TABS
2.5000 mg | ORAL_TABLET | Freq: Once | ORAL | Status: AC
  Administered 2013-11-27: 2.5 mg via ORAL
  Filled 2013-11-27: qty 1

## 2013-11-27 NOTE — Progress Notes (Signed)
Clinical Social Worker (CSW) contacted patient's son [name] to give bed offers. Son reported that [facility] is where he wants the patient to D/C to. CSW left bed offers in patient's room. Patient is aware of above. Weekday CSW will follow up.   Jetta Lout, LCSWA Weekend CSW [phone]

## 2013-11-27 NOTE — Progress Notes (Signed)
ANTICOAGULATION CONSULT NOTE - Follow Up Consult  Pharmacy Consult for Coumadin Indication: atrial fibrillation  Allergies  Allergen Reactions  . Ace Inhibitors     unknown    Patient Measurements: Height: 5\' 9"  (175.3 cm) Weight: 147 lb 11.2 oz (66.996 kg) IBW/kg (Calculated) : 70.7  Vital Signs: Temp: 98.4 F (36.9 C) (12/13 1422) Temp src: Oral (12/13 1422) BP: 133/61 mmHg (12/13 1422) Pulse Rate: 66 (12/13 1422)  Labs:  Recent Labs  11/24/13 2338 11/25/13 0355 11/25/13 0535 11/25/13 1055 11/26/13 0505 11/27/13 0445  HGB  --   --  14.8  --  15.8 15.5  HCT  --   --  44.0  --  46.0 45.6  PLT  --   --  165  --  167 163  LABPROT  --  25.1*  --   --  28.8* 29.7*  INR  --  2.37*  --   --  2.83* 2.95*  CREATININE  --  0.97  --   --  1.16 0.96  CKTOTAL 156  --   --   --   --   --   TROPONINI <0.30 <0.30  --  <0.30  --   --     Estimated Creatinine Clearance: 53.3 ml/min (by C-G formula based on Cr of 0.96).  Assessment: 87yom continues on coumadin for afib. INR remains therapeutic, but trending up over the last several days. CBC stable. No bleeding reported.  Home dose: 5mg  daily except 7.5mg  Tues/Fri  Goal of Therapy:  INR 2-3 Monitor platelets by anticoagulation protocol: Yes   Plan:  1) Coumadin 2.5mg  x 1 tonight 2) INR in AM  Fredrik Rigger 11/27/2013,4:26 PM

## 2013-11-28 DIAGNOSIS — E538 Deficiency of other specified B group vitamins: Secondary | ICD-10-CM

## 2013-11-28 LAB — PROTIME-INR: Prothrombin Time: 25.9 seconds — ABNORMAL HIGH (ref 11.6–15.2)

## 2013-11-28 MED ORDER — WARFARIN SODIUM 5 MG PO TABS
5.0000 mg | ORAL_TABLET | Freq: Once | ORAL | Status: AC
  Administered 2013-11-28: 5 mg via ORAL
  Filled 2013-11-28: qty 1

## 2013-11-28 NOTE — Progress Notes (Signed)
Ambulated 100 ft using rolling walker. Placed in wheelchair to rest but requested to walk back to room. Assisted back to bed. Call bell near.Joseph Bentley

## 2013-11-28 NOTE — Progress Notes (Signed)
ANTICOAGULATION CONSULT NOTE - Follow Up Consult  Pharmacy Consult for Coumadin Indication: atrial fibrillation  Allergies  Allergen Reactions  . Ace Inhibitors     unknown    Patient Measurements: Height: 5\' 9"  (175.3 cm) Weight: 147 lb 11.2 oz (66.996 kg) IBW/kg (Calculated) : 70.7  Vital Signs: Temp: 98.2 F (36.8 C) (12/14 0510) Temp src: Oral (12/14 0510) BP: 131/65 mmHg (12/14 0510) Pulse Rate: 75 (12/14 0510)  Labs:  Recent Labs  11/25/13 1055 11/26/13 0505 11/27/13 0445 11/28/13 0520  HGB  --  15.8 15.5  --   HCT  --  46.0 45.6  --   PLT  --  167 163  --   LABPROT  --  28.8* 29.7* 25.9*  INR  --  2.83* 2.95* 2.47*  CREATININE  --  1.16 0.96  --   TROPONINI <0.30  --   --   --     Estimated Creatinine Clearance: 53.3 ml/min (by C-G formula based on Cr of 0.96).  Assessment: 87yom continues on coumadin for afib. INR has been therapeutic this admission. Was trending up with 5mg  doses so lower 2.5mg  dose given last night - 2.47 today.  Home dose: 5mg  daily except 7.5mg  Tues/Fri  Goal of Therapy:  INR 2-3 Monitor platelets by anticoagulation protocol: Yes   Plan:  1) Coumadin 5mg  x 1 tonight 2) INR in AM  Fredrik Rigger 11/28/2013,10:17 AM

## 2013-11-28 NOTE — Progress Notes (Signed)
Patient ID: Joseph Bentley, male   DOB: [birth year], 79 y.o.   MRN: [id number] [facility] PROGRESS NOTE  Joseph Bentley:[id number] DOB: 17-Jun-1926 DOA: 11/24/2013 PCP: Nicki Reaper, NP  Assessment/Plan: 1. Dispo: patient awating SNF placement. There have been no changes in status. 2. Bilateral lower extremity weakness: Persistent and without pain. MRI brain, C spine, T spine show age related changes.  PT has recommended SNF. Awaiting placement.  Continue to work with PT. 3. Chest pain: resolved. No further chest pain. 4. Afib: rate controled, continue coumadin 5. HTN: controled continue home regimen 6. History of seizure disorder: continue home regimen 7. B12 deficiency: check level today  Code Status: full Family Communication: spoke with patient  Disposition Plan: awaiting placement    Consultants: PT  Procedures:  Imaging as below  Antibiotics:  none  HPI/Subjective: 79 y.o. male with history of CAD status post CABG, seizures, hypertension, atrial fibrillation was brought to the ER after patient was complaining of chest pressure and 2 days of lower extremity weakness.  Today he denies any pain, shortness of breath.  He continues to feel that his legs are weak and he is afraid to fall.  On examination muscle strength is in tact. PT reports that he has a significant gait abnormality and needs SNF placement.  Awaiting placement. No new complaints.   Objective: Filed Vitals:   11/28/13 0510  BP: 131/65  Pulse: 75  Temp: 98.2 F (36.8 C)  Resp: 18    Intake/Output Summary (Last 24 hours) at 11/28/13 0856 Last data filed at 11/28/13 9562  Gross per 24 hour  Intake    504 ml  Output    351 ml  Net    153 ml   Filed Weights   11/24/13 2119  Weight: 66.996 kg (147 lb 11.2 oz)    Exam:   General:  NAD, alert, calm  Cardiovascular: irreg, normal rate SEM 3/6  Respiratory: CTAB good air movement  Musculoskeletal: strength is 5/5 in all muscle  groups on examination, there are no painful, warm or swollen joints  Data Reviewed: Basic Metabolic Panel:  Recent Labs Lab 11/22/13 0800 11/24/13 1846 11/25/13 0355 11/26/13 0505 11/27/13 0445  NA 134* 132* 132* 134* 135  K 4.5 4.7 5.3* 4.8 4.3  CL 97 96 99 98 101  CO2 26 26 22 26 25   GLUCOSE 92 86 86 97 97  BUN 27* 26* 23 24* 24*  CREATININE 1.07 1.26 0.97 1.16 0.96  CALCIUM 9.3 8.7 8.7 9.0 8.6   Liver Function Tests:  Recent Labs Lab 11/25/13 0355  AST 27  ALT 9  ALKPHOS 128*  BILITOT 0.3  PROT 6.4  ALBUMIN 3.5   No results found for this basename: LIPASE, AMYLASE,  in the last 168 hours No results found for this basename: AMMONIA,  in the last 168 hours CBC:  Recent Labs Lab 11/22/13 0800 11/24/13 1846 11/25/13 0535 11/26/13 0505 11/27/13 0445  WBC 5.8 6.0 5.0 6.1 6.1  NEUTROABS 4.0 3.8 3.3  --   --   HGB 15.8 15.3 14.8 15.8 15.5  HCT 46.5 44.8 44.0 46.0 45.6  MCV 94.9 96.8 94.4 95.6 95.8  PLT 196 184 165 167 163   Cardiac Enzymes:  Recent Labs Lab 11/22/13 0800 11/24/13 1851 11/24/13 2338 11/25/13 0355 11/25/13 1055  CKTOTAL  --   --  156  --   --   TROPONINI <0.30 <0.30 <0.30 <0.30 <0.30   BNP (last 3 results)  Recent  Labs  11/24/13 1851  PROBNP 326.2   CBG: No results found for this basename: GLUCAP,  in the last 168 hours  Recent Results (from the past 240 hour(s))  URINE CULTURE     Status: None   Collection Time    11/24/13  4:50 PM      Result Value Range Status   Specimen Description URINE, CLEAN CATCH   Final   Special Requests NONE   Final   Culture  Setup Time     Final   Value: 11/24/2013 21:07     Performed at [facility] Count     Final   Value: NO GROWTH     Performed at [facility]   Culture     Final   Value: NO GROWTH     Performed at [facility]   Report Status 11/25/2013 FINAL   Final     Studies: No results found.  Scheduled Meds: . hydrALAZINE  100 mg Oral TID   . labetalol  200 mg Oral TID  . levETIRAcetam  500 mg Oral BID  . pantoprazole  40 mg Oral BID  . phenytoin  200 mg Oral BID  . sodium chloride  3 mL Intravenous Q12H  . Warfarin - Pharmacist Dosing Inpatient   Does not apply q1800   Continuous Infusions: . sodium chloride 10 mL/hr at 11/24/13 2349    Principal Problem:   Lower extremity weakness Active Problems:   Seizure   HTN (hypertension)   Atrial fibrillation   CAD (coronary artery disease)   B12 deficiency   Chest pain   Muscle weakness of lower extremity    Time spent: 30 minutes    [facility]  [facility] Pager [phone]. If 7PM-7AM, please contact night-coverage at [email or web address], password [facility] 11/28/2013, 8:56 AM  LOS: 4 days

## 2013-11-29 LAB — PROTIME-INR: Prothrombin Time: 19.5 seconds — ABNORMAL HIGH (ref 11.6–15.2)

## 2013-11-29 NOTE — Progress Notes (Signed)
Clinical Social Work Department CLINICAL SOCIAL WORK PLACEMENT NOTE 11/29/2013  Patient:  Joseph Bentley, Joseph Bentley  Account Number:  [id number] Admit date:  11/24/2013  Clinical Social Worker:  Read Drivers  Date/time:  11/26/2013 02:03 PM  Clinical Social Work is seeking post-discharge placement for this patient at the following level of care:   SKILLED NURSING   (*CSW will update this form in Epic as items are completed)   11/26/2013  Patient/family provided with [facility] Department of Clinical Social Work's list of facilities offering this level of care within the geographic area requested by the patient (or if unable, by the patient's family).  11/26/2013  Patient/family informed of their freedom to choose among providers that offer the needed level of care, that participate in Medicare, Medicaid or managed care program needed by the patient, have an available bed and are willing to accept the patient.  11/26/2013  Patient/family informed of [facility]' ownership interest in [facility], as well as of the fact that they are under no obligation to receive care at this facility.  PASARR submitted to EDS on  PASARR number received from EDS on   FL2 transmitted to all facilities in geographic area requested by pt/family on  11/26/2013 FL2 transmitted to all facilities within larger geographic area on   Patient informed that his/her managed care company has contracts with or will negotiate with  certain facilities, including the following:     Patient/family informed of bed offers received:  11/29/2013 Patient chooses bed at [facility] Physician recommends and patient chooses bed at    Patient to be transferred to [facility] on  11/29/2013 Patient to be transferred to facility by [facility]  The following physician request were entered in Epic:   Additional Comments: Pt PASARR is exisiting ([facility] 11/26/2013)  Maree Krabbe, MSW, Theresia Majors [phone]

## 2013-11-29 NOTE — Progress Notes (Signed)
Clinical Social Worker facilitated patient discharge by contacting the patient, family and facility, [facility]. Patient agreeable to this plan and arranging transport via patient's son. CSW will sign off, as social work intervention is no longer needed.  Maree Krabbe, MSW, Theresia Majors [phone]

## 2013-11-29 NOTE — Progress Notes (Signed)
Patient's son to see patient. Ok for discharge. SNF papers given. Patient's son registered with facility. Transported via wheelchair to lobby for transport to SNF. Belongings with patient and family.Joseph Bentley

## 2013-11-29 NOTE — Progress Notes (Signed)
CSW spoke to patient's son about bed offers. Patient and son chose [facility]. Per MD, patient is ready today. Son is meeting with Liaison from [facility] at 2:00 PM. CSW will facilitate dc today and transportation arranged by patient's son.  Maree Krabbe, MSW, Theresia Majors [phone]

## 2013-11-29 NOTE — Progress Notes (Signed)
Report called to [facility] for patient transfer. Awaiting patient's son for transport.Joseph Bentley

## 2013-12-03 ENCOUNTER — Non-Acute Institutional Stay (SKILLED_NURSING_FACILITY): Payer: Medicare Other | Admitting: Internal Medicine

## 2013-12-03 DIAGNOSIS — I10 Essential (primary) hypertension: Secondary | ICD-10-CM

## 2013-12-03 DIAGNOSIS — R569 Unspecified convulsions: Secondary | ICD-10-CM

## 2013-12-03 DIAGNOSIS — I4891 Unspecified atrial fibrillation: Secondary | ICD-10-CM

## 2013-12-03 DIAGNOSIS — I2510 Atherosclerotic heart disease of native coronary artery without angina pectoris: Secondary | ICD-10-CM

## 2013-12-07 ENCOUNTER — Encounter: Payer: Self-pay | Admitting: Internal Medicine
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
## 2013-12-20 ENCOUNTER — Telehealth: Payer: Self-pay | Admitting: Cardiology

## 2013-12-20 NOTE — Telephone Encounter (Signed)
Returned call to patient's son Clair Gulling no answer.[place].

## 2013-12-20 NOTE — Telephone Encounter (Signed)
New Problem:  Pt's son states he is returning a call to [place]. States he will give more details when the nurse calls back.

## 2013-12-22 NOTE — Telephone Encounter (Signed)
Received call from patient's son Clair Gulling.He stated father has been dizzy and weak for 1 month.Stated he went to ER 11/22/13 and was started on labetalol for elevated B/P.Stated he continues to feel weak and dizzy.Appointment scheduled with Dr.Jordan 12/27/13.

## 2013-12-24 ENCOUNTER — Encounter: Payer: Self-pay | Admitting: Cardiology

## 2013-12-27 ENCOUNTER — Telehealth: Payer: Self-pay | Admitting: Neurology

## 2013-12-27 ENCOUNTER — Ambulatory Visit (INDEPENDENT_AMBULATORY_CARE_PROVIDER_SITE_OTHER): Payer: Medicare Other | Admitting: Cardiology

## 2013-12-27 ENCOUNTER — Encounter: Payer: Self-pay | Admitting: Cardiology

## 2013-12-27 VITALS — BP 120/70 | HR 82 | Ht 69.0 in | Wt 148.0 lb

## 2013-12-27 DIAGNOSIS — R2681 Unsteadiness on feet: Secondary | ICD-10-CM

## 2013-12-27 DIAGNOSIS — M6281 Muscle weakness (generalized): Secondary | ICD-10-CM

## 2013-12-27 DIAGNOSIS — Z7901 Long term (current) use of anticoagulants: Secondary | ICD-10-CM

## 2013-12-27 DIAGNOSIS — I2510 Atherosclerotic heart disease of native coronary artery without angina pectoris: Secondary | ICD-10-CM

## 2013-12-27 DIAGNOSIS — I10 Essential (primary) hypertension: Secondary | ICD-10-CM

## 2013-12-27 DIAGNOSIS — I4891 Unspecified atrial fibrillation: Secondary | ICD-10-CM

## 2013-12-27 DIAGNOSIS — R269 Unspecified abnormalities of gait and mobility: Secondary | ICD-10-CM

## 2013-12-27 HISTORY — DX: Unsteadiness on feet: R26.81

## 2013-12-27 NOTE — Patient Instructions (Signed)
We will get you an appointment to see Dr. Jannifer Franklin  Continue your current cardiac meds.  I will see you in 3 months
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
## 2013-12-29 ENCOUNTER — Ambulatory Visit (INDEPENDENT_AMBULATORY_CARE_PROVIDER_SITE_OTHER): Payer: Medicare Other | Admitting: Neurology

## 2013-12-29 ENCOUNTER — Encounter: Payer: Self-pay | Admitting: Neurology

## 2013-12-29 VITALS — BP 134/68 | HR 80 | Ht 69.0 in | Wt 146.0 lb

## 2013-12-29 DIAGNOSIS — R269 Unspecified abnormalities of gait and mobility: Secondary | ICD-10-CM

## 2013-12-29 DIAGNOSIS — R2681 Unsteadiness on feet: Secondary | ICD-10-CM

## 2013-12-29 DIAGNOSIS — R569 Unspecified convulsions: Secondary | ICD-10-CM

## 2013-12-29 MED ORDER — LEVETIRACETAM 750 MG PO TABS: 750.0000 mg | ORAL_TABLET | Freq: Two times a day (BID) | ORAL | Status: DC
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
## 2013-12-29 NOTE — Patient Instructions (Addendum)
Reduce the dilantin by 100 mg every 2 weeks until off of the medication. We will go up on the keppra to 750 mg twice a day      Fall Prevention and Home Safety Falls cause injuries and can affect all age groups. It is possible to use preventive measures to significantly decrease the likelihood of falls. There are many simple measures which can make your home safer and prevent falls. OUTDOORS  Repair cracks and edges of walkways and driveways.  Remove high doorway thresholds.  Trim shrubbery on the main path into your home.  Have good outside lighting.  Clear walkways of tools, rocks, debris, and clutter.  Check that handrails are not broken and are securely fastened. Both sides of steps should have handrails.  Have leaves, snow, and ice cleared regularly.  Use sand or salt on walkways during winter months.  In the garage, clean up grease or oil spills. BATHROOM  Install night lights.  Install grab bars by the toilet and in the tub and shower.  Use non-skid mats or decals in the tub or shower.  Place a plastic non-slip stool in the shower to sit on, if needed.  Keep floors dry and clean up all water on the floor immediately.  Remove soap buildup in the tub or shower on a regular basis.  Secure bath mats with non-slip, double-sided rug tape.  Remove throw rugs and tripping hazards from the floors. BEDROOMS  Install night lights.  Make sure a bedside light is easy to reach.  Do not use oversized bedding.  Keep a telephone by your bedside.  Have a firm chair with side arms to use for getting dressed.  Remove throw rugs and tripping hazards from the floor. KITCHEN  Keep handles on pots and pans turned toward the center of the stove. Use back burners when possible.  Clean up spills quickly and allow time for drying.  Avoid walking on wet floors.  Avoid hot utensils and knives.  Position shelves so they are not too high or low.  Place commonly used  objects within easy reach.  If necessary, use a sturdy step stool with a grab bar when reaching.  Keep electrical cables out of the way.  Do not use floor polish or wax that makes floors slippery. If you must use wax, use non-skid floor wax.  Remove throw rugs and tripping hazards from the floor. STAIRWAYS  Never leave objects on stairs.  Place handrails on both sides of stairways and use them. Fix any loose handrails. Make sure handrails on both sides of the stairways are as long as the stairs.  Check carpeting to make sure it is firmly attached along stairs. Make repairs to worn or loose carpet promptly.  Avoid placing throw rugs at the top or bottom of stairways, or properly secure the rug with carpet tape to prevent slippage. Get rid of throw rugs, if possible.  Have an electrician put in a light switch at the top and bottom of the stairs. OTHER FALL PREVENTION TIPS  Wear low-heel or rubber-soled shoes that are supportive and fit well. Wear closed toe shoes.  When using a stepladder, make sure it is fully opened and both spreaders are firmly locked. Do not climb a closed stepladder.  Add color or contrast paint or tape to grab bars and handrails in your home. Place contrasting color strips on first and last steps.  Learn and use mobility aids as needed. Install an electrical emergency response system.  Turn on lights to avoid dark areas. Replace light bulbs that burn out immediately. Get light switches that glow.  Arrange furniture to create clear pathways. Keep furniture in the same place.  Firmly attach carpet with non-skid or double-sided tape.  Eliminate uneven floor surfaces.  Select a carpet pattern that does not visually hide the edge of steps.  Be aware of all pets. OTHER HOME SAFETY TIPS  Set the water temperature for 120 F (48.8 C).  Keep emergency numbers on or near the telephone.  Keep smoke detectors on every level of the home and near sleeping  areas. Document Released: 11/22/2002 Document Revised: 06/02/2012 Document Reviewed: 02/21/2012 [facility] Patient Information 2014 [place].

## 2014-01-01 LAB — PHENYTOIN LEVEL, FREE AND TOTAL: Phenytoin, Free: 0.8 ug/mL — ABNORMAL LOW (ref 1.0–2.0)

## 2014-01-01 LAB — RPR: RPR: NONREACTIVE

## 2014-01-01 LAB — VITAMIN B1, WHOLE BLOOD: Thiamine: 131.1 nmol/L (ref 66.5–200.0)

## 2014-01-01 LAB — COPPER, SERUM: COPPER: 129 ug/dL (ref 72–166)

## 2014-01-12 ENCOUNTER — Non-Acute Institutional Stay (SKILLED_NURSING_FACILITY): Payer: Medicare Other | Admitting: Adult Health

## 2014-01-12 DIAGNOSIS — I2510 Atherosclerotic heart disease of native coronary artery without angina pectoris: Secondary | ICD-10-CM

## 2014-01-12 DIAGNOSIS — I10 Essential (primary) hypertension: Secondary | ICD-10-CM

## 2014-01-12 DIAGNOSIS — I4891 Unspecified atrial fibrillation: Secondary | ICD-10-CM

## 2014-01-12 DIAGNOSIS — R569 Unspecified convulsions: Secondary | ICD-10-CM

## 2014-01-12 DIAGNOSIS — E349 Endocrine disorder, unspecified: Secondary | ICD-10-CM

## 2014-01-12 DIAGNOSIS — K219 Gastro-esophageal reflux disease without esophagitis: Secondary | ICD-10-CM

## 2014-01-12 DIAGNOSIS — E291 Testicular hypofunction: Secondary | ICD-10-CM

## 2014-01-12 NOTE — Progress Notes (Signed)
Patient ID: Joseph Bentley, male   DOB: [birth year], 80 y.o.   MRN: [id number]               PROGRESS NOTE  DATE: 01/12/2014  FACILITY: Nursing Home Location: [facility]  LEVEL OF CARE: SNF (31)  Routine Visit  CHIEF COMPLAINT:  Manage Seizure, Hypertension, Atrial Fibrillation and CAD  HISTORY OF PRESENT ILLNESS:  REASSESSMENT OF ONGOING PROBLEM(S):  HTN: Pt 's  BPs has been elevated.  Denies CP, sob, DOE, pedal edema, headaches, dizziness or visual disturbances.  No complications from the medications currently being used.  Last BP : 131/77, 142/77, 160/78, 162/80  ATRIAL FIBRILLATION: the patients atrial fibrillation remains stable.  The patient denies DOE, tachycardia, orthopnea, transient neurological sx, pedal edema, palpitations, & PNDs.  No complications noted from the medications currently being used.  SEIZURE DISORDER: The patient's seizure disorder remains stable. No complications reported from the medications presently being used. Staff do not report any recent seizure activity.  PAST MEDICAL HISTORY : Reviewed.  No changes.  CURRENT MEDICATIONS: Reviewed per [facility]  REVIEW OF SYSTEMS:  GENERAL: no change in appetite, no fatigue, no weight changes, no fever, chills or weakness RESPIRATORY: no cough, SOB, DOE, wheezing, hemoptysis CARDIAC: no chest pain, edema or palpitations GI: no abdominal pain, diarrhea, constipation, heart burn, nausea or vomiting  PHYSICAL EXAMINATION  VS:  T98.4       P70      RR18      BP131/77     POX94 %     W147.2T (Lb)  GENERAL: no acute distress, normal body habitus EYES: conjunctivae normal, sclerae normal, normal eye lids NECK: supple, trachea midline, no neck masses, no thyroid tenderness, no thyromegaly LYMPHATICS: no LAN in the neck, no supraclavicular LAN RESPIRATORY: breathing is even & unlabored, BS CTAB CARDIAC: heart rate irregularly irregular, no murmur,no extra heart sounds, no edema GI: abdomen soft,  normal BS, no masses, no tenderness, no hepatomegaly, no splenomegaly PSYCHIATRIC: the patient is alert & oriented to person, affect & behavior appropriate  LABS/RADIOLOGY: Labs reviewed: Basic Metabolic Panel:  Recent Labs  11/25/13 0355 11/26/13 0505 11/27/13 0445  NA 132* 134* 135  K 5.3* 4.8 4.3  CL 99 98 101  CO2 22 26 25   GLUCOSE 86 97 97  BUN 23 24* 24*  CREATININE 0.97 1.16 0.96  CALCIUM 8.7 9.0 8.6   Liver Function Tests:  Recent Labs  11/25/13 0355  AST 27  ALT 9  ALKPHOS 128*  BILITOT 0.3  PROT 6.4  ALBUMIN 3.5   CBC:  Recent Labs  11/22/13 0800 11/24/13 1846 11/25/13 0535 11/26/13 0505 11/27/13 0445  WBC 5.8 6.0 5.0 6.1 6.1  NEUTROABS 4.0 3.8 3.3  --   --   HGB 15.8 15.3 14.8 15.8 15.5  HCT 46.5 44.8 44.0 46.0 45.6  MCV 94.9 96.8 94.4 95.6 95.8  PLT 196 184 165 167 163   Cardiac Enzymes:  Recent Labs  11/24/13 2338 11/25/13 0355 11/25/13 1055  CKTOTAL 156  --   --   TROPONINI <0.30 <0.30 <0.30    ASSESSMENT/PLAN:  CAD - stable  Atrial fibrillation - rate controlled  Seizure - continue Keppra and Dilantin  GERD - continue Protonix  Hypertension - increase Labetalol to 200 mg 1 tab PO QID; BP and HR Q shift x 1 week and continue Hydralazine  Testosterone Deficiency - continue Testosterone cypionate Q 28 days   CPT CODE: 34742

## 2014-01-18 ENCOUNTER — Ambulatory Visit: Payer: Medicare Other | Admitting: Cardiology

## 2014-01-21 ENCOUNTER — Non-Acute Institutional Stay (SKILLED_NURSING_FACILITY): Payer: Medicare Other | Admitting: Adult Health

## 2014-01-21 DIAGNOSIS — I10 Essential (primary) hypertension: Secondary | ICD-10-CM

## 2014-01-21 DIAGNOSIS — E291 Testicular hypofunction: Secondary | ICD-10-CM

## 2014-01-21 DIAGNOSIS — E349 Endocrine disorder, unspecified: Secondary | ICD-10-CM

## 2014-01-21 DIAGNOSIS — R569 Unspecified convulsions: Secondary | ICD-10-CM

## 2014-01-21 DIAGNOSIS — I2510 Atherosclerotic heart disease of native coronary artery without angina pectoris: Secondary | ICD-10-CM

## 2014-01-21 DIAGNOSIS — K219 Gastro-esophageal reflux disease without esophagitis: Secondary | ICD-10-CM

## 2014-01-21 DIAGNOSIS — I4891 Unspecified atrial fibrillation: Secondary | ICD-10-CM

## 2014-01-21 NOTE — Progress Notes (Signed)
Patient ID: Joseph Bentley, male   DOB: 1926/10/03, 80 y.o.   MRN: [id number]              PROGRESS NOTE  DATE: 01/21/2014   FACILITY: [facility]  LEVEL OF CARE: SNF (31)  Acute Visit  CHIEF COMPLAINT:  Discharge Notes  HISTORY OF PRESENT ILLNESS: This is an 80 year old male who is for discharge home with Home health PT, OT, Nursing and CNA. He has been admitted to [facility] on 11/29/13 from [facility] with principal problem of Lower extremity weakness. Patient was admitted to this facility for short-term rehabilitation after the patient's recent hospitalization.  Patient has completed SNF rehabilitation and therapy has cleared the patient for discharge.  Reassessment of ongoing problem(s):  HTN: Pt 's HTN remains stable.  Denies CP, sob, DOE, pedal edema, headaches, dizziness or visual disturbances.  No complications from the medications currently being used.  Last BP :138/58  CAD: The angina has been stable. The patient denies dyspnea on exertion, orthopnea, pedal edema, palpitations and paroxysmal nocturnal dyspnea. No complications noted from the medication presently being used.  GERD: pt's GERD is stable.  Denies ongoing heartburn, abd. Pain, nausea or vomiting.  Currently on a PPI & tolerates it without any adverse reactions.  PAST MEDICAL HISTORY : Reviewed.  No changes.  CURRENT MEDICATIONS: Reviewed per [facility]  REVIEW OF SYSTEMS:  GENERAL: no change in appetite, no fatigue, no weight changes, no fever, chills or weakness RESPIRATORY: no cough, SOB, DOE, wheezing, hemoptysis CARDIAC: no chest pain, edema or palpitations GI: no abdominal pain, diarrhea, constipation, heart burn, nausea or vomiting  PHYSICAL EXAMINATION  VS:  T97.8       P76       RR18      BP 138/58     POX97 %       WT148 (Lb)  GENERAL: no acute distress, normal body habitus NECK: supple, trachea midline, no neck masses, no thyroid tenderness, no thyromegaly LYMPHATICS: no  LAN in the neck, no supraclavicular LAN RESPIRATORY: breathing is even & unlabored, BS CTAB CARDIAC: heart rate irregularly irregular, no murmur,no extra heart sounds, no edema GI: abdomen soft, normal BS, no masses, no tenderness, no hepatomegaly, no splenomegaly PSYCHIATRIC: the patient is alert & oriented to person, affect & behavior appropriate   LABS/RADIOLOGY: Labs reviewed: Basic Metabolic Panel:  Recent Labs  11/25/13 0355 11/26/13 0505 11/27/13 0445  NA 132* 134* 135  K 5.3* 4.8 4.3  CL 99 98 101  CO2 22 26 25   GLUCOSE 86 97 97  BUN 23 24* 24*  CREATININE 0.97 1.16 0.96  CALCIUM 8.7 9.0 8.6   Liver Function Tests:  Recent Labs  11/25/13 0355  AST 27  ALT 9  ALKPHOS 128*  BILITOT 0.3  PROT 6.4  ALBUMIN 3.5   CBC:  Recent Labs  11/22/13 0800 11/24/13 1846 11/25/13 0535 11/26/13 0505 11/27/13 0445  WBC 5.8 6.0 5.0 6.1 6.1  NEUTROABS 4.0 3.8 3.3  --   --   HGB 15.8 15.3 14.8 15.8 15.5  HCT 46.5 44.8 44.0 46.0 45.6  MCV 94.9 96.8 94.4 95.6 95.8  PLT 196 184 165 167 163   Cardiac Enzymes:  Recent Labs  11/24/13 2338 11/25/13 0355 11/25/13 1055  CKTOTAL 156  --   --   TROPONINI <0.30 <0.30 <0.30     ASSESSMENT/PLAN:  CAD - stable  Atrial fibrillation - rate controlled  Seizure - continue Keppra and Dilantin  currently being tapered off  GERD - continue Protonix  Hypertension - well-controlled; continue Hydralazine and Labetalol  Testosterone Deficiency - continue Testosterone cypionate Q 28 days     I have filled out patient's discharge paperwork and written prescriptions.  Patient will receive home health PT, OT, nursing and CNA.   Total discharge time: Less than 30 minutes Discharge time involved coordination of the discharge process with Education officer, museum, nursing staff and therapy department. Medical justification for home health services verified.  CPT CODE: 70177

## 2014-01-31 ENCOUNTER — Ambulatory Visit: Payer: Medicare Other | Admitting: Cardiology

## 2014-02-09 ENCOUNTER — Other Ambulatory Visit: Payer: Self-pay | Admitting: General Practice

## 2014-02-09 MED ORDER — WARFARIN SODIUM 5 MG PO TABS: ORAL_TABLET | ORAL | Status: DC

## 2014-02-17 ENCOUNTER — Ambulatory Visit: Payer: Medicare Other | Admitting: Internal Medicine

## 2014-02-18 ENCOUNTER — Telehealth: Payer: Self-pay | Admitting: Family Medicine

## 2014-02-18 NOTE — Telephone Encounter (Signed)
Danielle from [facility] left vm stating that she checked pt's INR once after he left hospital but never heard back.  She wants to know if you want pt to come in to clinic for INR checks or give her an order to check at home.  Last anticoag check I see is from 10/2013.  Cb [phone]

## 2014-02-19 ENCOUNTER — Other Ambulatory Visit: Payer: Self-pay | Admitting: Adult Health

## 2014-02-22 ENCOUNTER — Telehealth: Payer: Self-pay | Admitting: General Practice

## 2014-02-22 NOTE — Telephone Encounter (Signed)
Faxed order to [facility] to have [facility] RN check patient's INR on 3/10 or 3/11 and call results to [phone].

## 2014-02-23 ENCOUNTER — Ambulatory Visit (INDEPENDENT_AMBULATORY_CARE_PROVIDER_SITE_OTHER): Payer: Medicare Other | Admitting: General Practice

## 2014-02-23 LAB — POCT INR: INR: 1.8

## 2014-02-23 NOTE — Progress Notes (Signed)
Pre visit review using our clinic review tool, if applicable. No additional management support is needed unless otherwise documented below in the visit note. 

## 2014-02-24 ENCOUNTER — Telehealth: Payer: Self-pay | Admitting: Neurology

## 2014-02-24 ENCOUNTER — Other Ambulatory Visit: Payer: Self-pay | Admitting: Neurology

## 2014-02-24 ENCOUNTER — Other Ambulatory Visit: Payer: Self-pay | Admitting: Adult Health

## 2014-02-24 ENCOUNTER — Other Ambulatory Visit: Payer: Self-pay | Admitting: *Deleted

## 2014-02-24 MED ORDER — LEVETIRACETAM 750 MG PO TABS
750.0000 mg | ORAL_TABLET | Freq: Two times a day (BID) | ORAL | Status: DC
Start: 2014-02-24 — End: 2014-06-20

## 2014-02-24 MED ORDER — HYDRALAZINE HCL 100 MG PO TABS: 100.0000 mg | ORAL_TABLET | Freq: Three times a day (TID) | ORAL | Status: DC

## 2014-02-24 NOTE — Telephone Encounter (Signed)
Telephone call noted. I did not called the son back. The patient apparently has done much better with his walking after a Dilantin taper. The patient is on Keppra at this time.

## 2014-02-24 NOTE — Telephone Encounter (Signed)
Last OV from Jan says:  Patient Instructions     Reduce the dilantin by 100 mg every 2 weeks until off of the medication. We will go up on the keppra to 750 mg twice a day      Refill for generic Keppra has been sent to [facility] per patient request.

## 2014-02-24 NOTE — Telephone Encounter (Signed)
Pt's son called and wanted to let Dr. Jannifer Franklin know that the changes in the medication have worked tremendously with his father.  He seems to have more strength.  This is just an FYI and a thank you for your help.

## 2014-02-24 NOTE — Telephone Encounter (Signed)
Pt called and stated he needed a refill on his levetiracetam.  He asked that [facility] to [facility] on [address].  Thank you

## 2014-02-24 NOTE — Telephone Encounter (Signed)
Patient's son calling with an update- FYI

## 2014-03-02 ENCOUNTER — Ambulatory Visit (INDEPENDENT_AMBULATORY_CARE_PROVIDER_SITE_OTHER): Payer: Medicare Other | Admitting: General Practice

## 2014-03-02 DIAGNOSIS — I4891 Unspecified atrial fibrillation: Secondary | ICD-10-CM

## 2014-03-02 DIAGNOSIS — I10 Essential (primary) hypertension: Secondary | ICD-10-CM

## 2014-03-02 DIAGNOSIS — I2510 Atherosclerotic heart disease of native coronary artery without angina pectoris: Secondary | ICD-10-CM

## 2014-03-02 DIAGNOSIS — Z5181 Encounter for therapeutic drug level monitoring: Secondary | ICD-10-CM

## 2014-03-02 HISTORY — DX: Encounter for therapeutic drug level monitoring: Z51.81

## 2014-03-02 LAB — POCT INR: INR: 1.9

## 2014-03-02 NOTE — Progress Notes (Signed)
Pre visit review using our clinic review tool, if applicable. No additional management support is needed unless otherwise documented below in the visit note. 

## 2014-03-04 ENCOUNTER — Encounter: Payer: Self-pay | Admitting: Physician Assistant

## 2014-03-04 ENCOUNTER — Ambulatory Visit (INDEPENDENT_AMBULATORY_CARE_PROVIDER_SITE_OTHER): Payer: Medicare Other | Admitting: Physician Assistant

## 2014-03-04 ENCOUNTER — Other Ambulatory Visit (INDEPENDENT_AMBULATORY_CARE_PROVIDER_SITE_OTHER): Payer: Medicare Other

## 2014-03-04 VITALS — BP 130/72 | HR 76 | Temp 97.7°F | Ht 69.0 in | Wt 151.4 lb

## 2014-03-04 DIAGNOSIS — K219 Gastro-esophageal reflux disease without esophagitis: Secondary | ICD-10-CM

## 2014-03-04 DIAGNOSIS — I10 Essential (primary) hypertension: Secondary | ICD-10-CM

## 2014-03-04 DIAGNOSIS — R569 Unspecified convulsions: Secondary | ICD-10-CM

## 2014-03-04 DIAGNOSIS — Z0000 Encounter for general adult medical examination without abnormal findings: Secondary | ICD-10-CM

## 2014-03-04 DIAGNOSIS — I4891 Unspecified atrial fibrillation: Secondary | ICD-10-CM

## 2014-03-04 DIAGNOSIS — E785 Hyperlipidemia, unspecified: Secondary | ICD-10-CM

## 2014-03-04 LAB — URINALYSIS, ROUTINE W REFLEX MICROSCOPIC
Bilirubin Urine: NEGATIVE
HGB URINE DIPSTICK: NEGATIVE
Ketones, ur: NEGATIVE
Leukocytes, UA: NEGATIVE
Nitrite: NEGATIVE
RBC / HPF: NONE SEEN (ref 0–?)
SPECIFIC GRAVITY, URINE: 1.02 (ref 1.000–1.030)
TOTAL PROTEIN, URINE-UPE24: NEGATIVE
URINE GLUCOSE: NEGATIVE
Urobilinogen, UA: 0.2 (ref 0.0–1.0)
pH: 5.5 (ref 5.0–8.0)

## 2014-03-04 LAB — CBC WITH DIFFERENTIAL/PLATELET
BASOS ABS: 0 10*3/uL (ref 0.0–0.1)
BASOS PCT: 0.5 % (ref 0.0–3.0)
EOS ABS: 0.5 10*3/uL (ref 0.0–0.7)
Eosinophils Relative: 8.7 % — ABNORMAL HIGH (ref 0.0–5.0)
HEMATOCRIT: 43.5 % (ref 39.0–52.0)
HEMOGLOBIN: 14.4 g/dL (ref 13.0–17.0)
LYMPHS ABS: 0.9 10*3/uL (ref 0.7–4.0)
LYMPHS PCT: 16.6 % (ref 12.0–46.0)
MCHC: 33.2 g/dL (ref 30.0–36.0)
MCV: 96.1 fl (ref 78.0–100.0)
MONO ABS: 0.6 10*3/uL (ref 0.1–1.0)
Monocytes Relative: 10.3 % (ref 3.0–12.0)
NEUTROS ABS: 3.5 10*3/uL (ref 1.4–7.7)
Neutrophils Relative %: 63.9 % (ref 43.0–77.0)
Platelets: 248 10*3/uL (ref 150.0–400.0)
RBC: 4.53 Mil/uL (ref 4.22–5.81)
RDW: 14.3 % (ref 11.5–14.6)
WBC: 5.4 10*3/uL (ref 4.5–10.5)

## 2014-03-04 LAB — BASIC METABOLIC PANEL
BUN: 22 mg/dL (ref 6–23)
CHLORIDE: 97 meq/L (ref 96–112)
CO2: 30 meq/L (ref 19–32)
Calcium: 9.2 mg/dL (ref 8.4–10.5)
Creatinine, Ser: 1 mg/dL (ref 0.4–1.5)
GFR: 73.58 mL/min (ref >60.00)
Glucose, Bld: 81 mg/dL (ref 70–99)
Potassium: 4.8 mEq/L (ref 3.5–5.1)
Sodium: 133 mEq/L — ABNORMAL LOW (ref 135–145)

## 2014-03-04 LAB — HEPATIC FUNCTION PANEL
ALT: 13 U/L (ref 0–53)
AST: 20 U/L (ref 0–37)
Albumin: 4.4 g/dL (ref 3.5–5.2)
Alkaline Phosphatase: 89 U/L (ref 39–117)
BILIRUBIN TOTAL: 0.7 mg/dL (ref 0.3–1.2)
Bilirubin, Direct: 0.1 mg/dL (ref 0.0–0.3)
Total Protein: 7 g/dL (ref 6.0–8.3)

## 2014-03-04 LAB — TSH: TSH: 3.68 u[IU]/mL (ref 0.35–5.50)

## 2014-03-04 NOTE — Progress Notes (Signed)
Pre visit review using our clinic review tool, if applicable. No additional management support is needed unless otherwise documented below in the visit note. 

## 2014-03-04 NOTE — Patient Instructions (Signed)
It was great meeting you today Joseph Bentley!   I have ordered labs for you, please report to the lab in the lower level to have them completed.   Health Maintenance, Males A healthy lifestyle and preventative care can promote health and wellness.  Maintain regular health, dental, and eye exams.  Eat a healthy diet. Foods like vegetables, fruits, whole grains, low-fat dairy products, and lean protein foods contain the nutrients you need and are low in calories. Decrease your intake of foods high in solid fats, added sugars, and salt. Get information about a proper diet from your health care provider, if necessary.  Regular physical exercise is one of the most important things you can do for your health. Most adults should get at least 150 minutes of moderate-intensity exercise (any activity that increases your heart rate and causes you to sweat) each week. In addition, most adults need muscle-strengthening exercises on 2 or more days a week.   Maintain a healthy weight. The body mass index (BMI) is a screening tool to identify possible weight problems. It provides an estimate of body fat based on height and weight. Your health care provider can find your BMI and can help you achieve or maintain a healthy weight. For males 20 years and older:  A BMI below 18.5 is considered underweight.  A BMI of 18.5 to 24.9 is normal.  A BMI of 25 to 29.9 is considered overweight.  A BMI of 30 and above is considered obese.  Maintain normal blood lipids and cholesterol by exercising and minimizing your intake of saturated fat. Eat a balanced diet with plenty of fruits and vegetables. Blood tests for lipids and cholesterol should begin at age 75 and be repeated every 5 years. If your lipid or cholesterol levels are high, you are over 50, or you are at high risk for heart disease, you may need your cholesterol levels checked more frequently.Ongoing high lipid and cholesterol levels should be treated with  medicines, if diet and exercise are not working.  If you smoke, find out from your health care provider how to quit. If you do not use tobacco, do not start.  Lung cancer screening is recommended for adults aged 87 80 years who are at high risk for developing lung cancer because of a history of smoking. A yearly low-dose CT scan of the lungs is recommended for people who have at least a 30-pack-year history of smoking and are a current smoker or have quit within the past 15 years. A pack year of smoking is smoking an average of 1 pack of cigarettes a day for 1 year (for example, a 30-pack-year history of smoking could mean smoking 1 pack a day for 30 years or 2 packs a day for 15 years). Yearly screening should continue until the smoker has stopped smoking for at least 15 years. Yearly screening should be stopped for people who develop a health problem that would prevent them from having lung cancer treatment.  If you choose to drink alcohol, do not have more than 2 drinks per day. One drink is considered to be 12 oz (360 mL) of beer, 5 oz (150 mL) of wine, or 1.5 oz (45 mL) of liquor.  Avoid use of street drugs. Do not share needles with anyone. Ask for help if you need support or instructions about stopping the use of drugs.  High blood pressure causes heart disease and increases the risk of stroke. Blood pressure should be checked at least every  1 2 years. Ongoing high blood pressure should be treated with medicines if weight loss and exercise are not effective.  If you are 2 78 years old, ask your health care provider if you should take aspirin to prevent heart disease.  Diabetes screening involves taking a blood sample to check your fasting blood sugar level. This should be done once every 3 years after age 6, if you are at a normal weight and without risk factors for diabetes. Testing should be considered at a younger age or be carried out more frequently if you are overweight and have at least  1 risk factor for diabetes.  Colorectal cancer can be detected and often prevented. Most routine colorectal cancer screening begins at the age of 76 and continues through age 65. However, your health care provider may recommend screening at an earlier age if you have risk factors for colon cancer. On a yearly basis, your health care provider may provide home test kits to check for hidden blood in the stool. A small camera at the end of a tube may be used to directly examine the colon (sigmoidoscopy or colonoscopy) to detect the earliest forms of colorectal cancer. Talk to your health care provider about this at age 62, when routine screening begins. A direct exam of the colon should be repeated every 5 10 years through age 31, unless early forms of pre-cancerous polyps or small growths are found.  People who are at an increased risk for hepatitis B should be screened for this virus. You are considered at high risk for hepatitis B if:  You were born in a country where hepatitis B occurs often. Talk with your health care provider about which countries are considered high-risk.  Your parents were born in a high-risk country and you have not received a shot to protect against hepatitis B (hepatitis B vaccine).  You have HIV or AIDS.  You use needles to inject street drugs.  You live with, or have sex with, someone who has hepatitis B.  You are a man who has sex with other men (MSM).  You get hemodialysis treatment.  You take certain medicines for conditions like cancer, organ transplantation, and autoimmune conditions.  Hepatitis C blood testing is recommended for all people born from [birth year] through 1963 and any individual with known risk factors for hepatitis C.  Healthy men should no longer receive prostate-specific antigen (PSA) blood tests as part of routine cancer screening. Talk to your health care provider about prostate cancer screening.  Testicular cancer screening is not recommended for  adolescents or adult males who have no symptoms. Screening includes self-exam, a health care provider exam, and other screening tests. Consult with your health care provider about any symptoms you have or any concerns you have about testicular cancer.  Practice safe sex. Use condoms and avoid high-risk sexual practices to reduce the spread of sexually transmitted infections (STIs).  Use sunscreen. Apply sunscreen liberally and repeatedly throughout the day. You should seek shade when your shadow is shorter than you. Protect yourself by wearing long sleeves, pants, a wide-brimmed hat, and sunglasses year round, whenever you are outdoors.  Tell your health care provider of new moles or changes in moles, especially if there is a change in shape or color. Also tell your provider if a mole is larger than the size of a pencil eraser.  A one-time screening for abdominal aortic aneurysm (AAA) and surgical repair of large AAAs by ultrasound is recommended for men aged  65 75 years who are current or former smokers.  Stay current with your vaccines (immunizations). Document Released: 05/30/2008 Document Revised: 09/22/2013 Document Reviewed: 04/29/2011 [facility] Patient Information 2014 [place], [place].   Hypertension Hypertension is another name for high blood pressure. High blood pressure may mean that your heart needs to work harder to pump blood. Blood pressure consists of two numbers, which includes a higher number over a lower number (example: 110/72). HOME CARE   Make lifestyle changes as told by your doctor. This may include weight loss and exercise.  Take your blood pressure medicine every day.  Limit how much salt you use.  Stop smoking if you smoke.  Do not use drugs.  Talk to your doctor if you are using decongestants or birth control pills. These medicines might make blood pressure higher.  Females should not drink more than 1 alcoholic drink per day. Males should not drink more than  2 alcoholic drinks per day.  See your doctor as told. GET HELP RIGHT AWAY IF:   You have a blood pressure reading with a top number of 180 or higher.  You get a very bad headache.  You get blurred or changing vision.  You feel confused.  You feel weak, numb, or faint.  You get chest or belly (abdominal) pain.  You throw up (vomit).  You cannot breathe very well. MAKE SURE YOU:   Understand these instructions.  Will watch your condition.  Will get help right away if you are not doing well or get worse. Document Released: 05/20/2008 Document Revised: 02/24/2012 Document Reviewed: 05/20/2008 [facility] Patient Information 2014 [place], [place].

## 2014-03-05 NOTE — Assessment & Plan Note (Signed)
Controlled with Levetiracetam 750 mg twice daily Saw neurology, Dr. Jannifer Franklin in last two months

## 2014-03-05 NOTE — Progress Notes (Signed)
Subjective:    Patient ID: Joseph Bentley, male    DOB: 1926/08/08, 80 y.o.   MRN: [id number]  HPI Comments: Patient is an 80 year old male who presents to the office for a 6 month follow up visit. Is accompanied by his son. Patient reports no current concerns. Is taking all medications as prescribed. States he is "feeling better than ever". Reports he has an appointment coming up with his cardiologist in the next month. Was seen by his Neurologist two months ago for seizure history. Has not seen his eye Dr or dentist in the last year. Son states they will be scheduling these appointment. Son reports patient has had home therapy to help develop his lower body strength and is doing much better. Denies chest pain/palpitations, N/V/F, cough, extremity swelling, change in bowel bladder habit, lightheaded, dizziness or weakness. Denies falls.    Review of Systems  Constitutional: Negative for fever, activity change and appetite change.  Eyes: Negative for visual disturbance.  Respiratory: Negative for shortness of breath and wheezing.   Cardiovascular: Negative for chest pain, palpitations and leg swelling.  Gastrointestinal: Positive for constipation (at times, treats with stool softener to prevent). Negative for nausea and vomiting.  Musculoskeletal: Positive for gait problem (uses walker to get around.).  Skin: Negative for rash.  Neurological: Negative for dizziness, weakness, light-headedness and headaches.   Past Medical History  Diagnosis Date  . Hypertension   . CAD (coronary artery disease)   . B12 deficiency   . Low testosterone   . BPH (benign prostatic hyperplasia)   . A-fib   . Fall   . Hyperlipidemia   . Chronic anticoagulation   . CHF (congestive heart failure)   . Anginal pain   . GERD (gastroesophageal reflux disease)   . Migraines     "had them ~ 30-40 yr ago" (11/24/2013)  . Epilepsy   . Grand mal seizure     "last one was about 5 yr ago" (11/24/2013)  . Stroke  2010    denies residual on 11/24/2013  . Anxiety   . Depression   . Hearing difficulty     Bilateral hearing aids  . Abnormality of gait   . Anticoagulant long-term use 12/08/2011  . Atrial fibrillation 12/08/2011  . Chest pain 11/24/2013  . Dehydration 12/08/2011  . Encounter for therapeutic drug monitoring 03/02/2014  . Gait instability 12/27/2013  . Hemorrhoids 08/20/2013  . HTN (hypertension) 12/08/2011  . Hyponatremia 01/08/2013  . Lower extremity weakness 11/24/2013  . Muscle weakness of lower extremity 11/24/2013  . Other and unspecified hyperlipidemia 08/20/2013  . Recurrent falls 01/07/2013  . Seizure 12/08/2011  . Testosterone insufficiency 08/20/2013   Current Outpatient Prescriptions on File Prior to Visit  Medication Sig Dispense Refill  . hydrALAZINE (APRESOLINE) 100 MG tablet Take 1 tablet (100 mg total) by mouth 3 (three) times daily.  90 tablet  3  . labetalol (NORMODYNE) 200 MG tablet Take 200 mg by mouth 3 (three) times daily. HALF A TAB A FOURTH TIME EACH DAY AROUND NOON      . levETIRAcetam (KEPPRA) 750 MG tablet Take 1 tablet (750 mg total) by mouth 2 (two) times daily.  60 tablet  3  . pantoprazole (PROTONIX) 40 MG tablet Take 40 mg by mouth 2 (two) times daily.      Marland Kitchen testosterone cypionate (DEPOTESTOTERONE CYPIONATE) 200 MG/ML injection Inject 1.5 mLs (300 mg total) into the muscle every 28 (twenty-eight) days.  10 mL  0  .  Vitamin D, Ergocalciferol, (DRISDOL) 50000 UNITS CAPS Take 50,000 Units by mouth every 7 (seven) days. On Tuesday      . warfarin (COUMADIN) 5 MG tablet Take as directed by anticoagulation clinic  40 tablet  2   No current facility-administered medications on file prior to visit.        Objective:   Physical Exam  Vitals reviewed. Constitutional: He is oriented to person, place, and time. He appears well-developed and well-nourished. No distress.  Pleasant, seated comfortably in chair. Smiles at appropriate times. Able to answer questions  appropriately. Corrects son multiple times in regards to factual data, ie names of doctors, medications.  HENT:  Head: Normocephalic and atraumatic.  Right Ear: Tympanic membrane, external ear and ear canal normal.  Left Ear: External ear and ear canal normal.  Nose: Nose normal.  Mouth/Throat: Uvula is midline and oropharynx is clear and moist. No trismus in the jaw.  Hearing aides bilateral.  Eyes: Conjunctivae are normal. No scleral icterus.  Neck: Normal range of motion.  Cardiovascular: Normal rate, regular rhythm, normal heart sounds and intact distal pulses.  Exam reveals no gallop and no friction rub.   No murmur heard. Pulses:      Radial pulses are 2+ on the right side, and 2+ on the left side.       Posterior tibial pulses are 2+ on the right side, and 2+ on the left side.  Pulmonary/Chest: Effort normal. He has decreased breath sounds. He has no wheezes. He has no rhonchi. He has no rales.  Abdominal: Soft.  Neurological: He is alert and oriented to person, place, and time. He has normal strength. Gait normal. GCS eye subscore is 4. GCS verbal subscore is 5. GCS motor subscore is 6.  Gait is slowed, uses wheeled walker  Skin: Skin is warm and dry. He is not diaphoretic.  Psychiatric: He has a normal mood and affect. His speech is normal and behavior is normal. Judgment and thought content normal. Cognition and memory are normal.   Filed Vitals:   03/04/14 1407  BP: 130/72  Pulse: 76  Temp: 97.7 F (36.5 C)   Lab Results  Component Value Date   WBC 5.4 03/04/2014   HGB 14.4 03/04/2014   HCT 43.5 03/04/2014   PLT 248.0 03/04/2014   GLUCOSE 81 03/04/2014   ALT 13 03/04/2014   AST 20 03/04/2014   NA 133* 03/04/2014   K 4.8 03/04/2014   CL 97 03/04/2014   CREATININE 1.0 03/04/2014   BUN 22 03/04/2014   CO2 30 03/04/2014   TSH 3.68 03/04/2014   INR 1.9 03/02/2014   Reviewed with patient: he has had no falls in some time, lives in an independent unit at [facility].  Participates in a meal plan and still prepares some snacks for himself. Ambulates with walker. No firearms in residence.     Assessment & Plan:    Patient has been counseled on age-appropriate routine health concerns for screening and prevention. These are reviewed and up-to-date. Immunizations are up-to-date or declined. Labs ordered and  reviewed.  HTN: Continue with current medications. Hydralazine 100 mg tid Labetolol 200 mg tid with 100 mg once daily at noon  A-fibrillation Treated with Warfarin 5 mg tablet, regulated by Coumadin clinic  Seizure, history of Controlled with Levetiracetam 750 mg twice daily  GERD Controlled with Pantoprazole 40 mg twice daily

## 2014-03-05 NOTE — Assessment & Plan Note (Signed)
Treated with Warfarin 5 mg tablet, regulated by Coumadin clinic

## 2014-03-05 NOTE — Assessment & Plan Note (Signed)
Controlled with Pantoprazole 40 mg twice daily

## 2014-03-05 NOTE — Assessment & Plan Note (Signed)
Continue with current medications. Hydralazine 100 mg tid Labetolol 200 mg tid with 100 mg once daily at noon

## 2014-03-10 ENCOUNTER — Ambulatory Visit (INDEPENDENT_AMBULATORY_CARE_PROVIDER_SITE_OTHER): Payer: Medicare Other | Admitting: General Practice

## 2014-03-10 DIAGNOSIS — Z5181 Encounter for therapeutic drug level monitoring: Secondary | ICD-10-CM

## 2014-03-10 DIAGNOSIS — I2510 Atherosclerotic heart disease of native coronary artery without angina pectoris: Secondary | ICD-10-CM

## 2014-03-10 DIAGNOSIS — I10 Essential (primary) hypertension: Secondary | ICD-10-CM

## 2014-03-10 DIAGNOSIS — I4891 Unspecified atrial fibrillation: Secondary | ICD-10-CM

## 2014-03-10 LAB — POCT INR: INR: 2.2

## 2014-03-10 NOTE — Progress Notes (Signed)
Pre visit review using our clinic review tool, if applicable. No additional management support is needed unless otherwise documented below in the visit note. 

## 2014-03-15 ENCOUNTER — Ambulatory Visit: Payer: Medicare Other | Admitting: Cardiology

## 2014-03-16 ENCOUNTER — Other Ambulatory Visit: Payer: Self-pay | Admitting: Cardiology

## 2014-03-16 NOTE — Telephone Encounter (Signed)
Is the patient still to be taking this? Please advise. Thanks, MI 

## 2014-03-17 ENCOUNTER — Other Ambulatory Visit: Payer: Self-pay | Admitting: Adult Health

## 2014-03-17 ENCOUNTER — Ambulatory Visit (INDEPENDENT_AMBULATORY_CARE_PROVIDER_SITE_OTHER): Payer: Medicare Other | Admitting: General Practice

## 2014-03-17 DIAGNOSIS — I10 Essential (primary) hypertension: Secondary | ICD-10-CM

## 2014-03-17 DIAGNOSIS — Z5181 Encounter for therapeutic drug level monitoring: Secondary | ICD-10-CM

## 2014-03-17 DIAGNOSIS — I4891 Unspecified atrial fibrillation: Secondary | ICD-10-CM

## 2014-03-17 DIAGNOSIS — I2510 Atherosclerotic heart disease of native coronary artery without angina pectoris: Secondary | ICD-10-CM

## 2014-03-17 LAB — POCT INR: INR: 3.1

## 2014-03-17 NOTE — Telephone Encounter (Signed)
Spoke to patient's son he stated patient is taking Labetalol.Refill sent to pharmacy.

## 2014-03-17 NOTE — Progress Notes (Signed)
Pre visit review using our clinic review tool, if applicable. No additional management support is needed unless otherwise documented below in the visit note. 

## 2014-03-25 ENCOUNTER — Ambulatory Visit (INDEPENDENT_AMBULATORY_CARE_PROVIDER_SITE_OTHER): Payer: Medicare Other | Admitting: General Practice

## 2014-03-25 DIAGNOSIS — I4891 Unspecified atrial fibrillation: Secondary | ICD-10-CM

## 2014-03-25 DIAGNOSIS — Z5181 Encounter for therapeutic drug level monitoring: Secondary | ICD-10-CM

## 2014-03-25 DIAGNOSIS — I10 Essential (primary) hypertension: Secondary | ICD-10-CM

## 2014-03-25 DIAGNOSIS — I2510 Atherosclerotic heart disease of native coronary artery without angina pectoris: Secondary | ICD-10-CM

## 2014-03-25 DIAGNOSIS — Z7901 Long term (current) use of anticoagulants: Secondary | ICD-10-CM

## 2014-03-25 LAB — POCT INR: INR: 1.8

## 2014-03-25 NOTE — Progress Notes (Signed)
Pre visit review using our clinic review tool, if applicable. No additional management support is needed unless otherwise documented below in the visit note. 

## 2014-03-30 ENCOUNTER — Ambulatory Visit: Payer: Medicare Other | Admitting: Cardiology

## 2014-04-01 ENCOUNTER — Ambulatory Visit (INDEPENDENT_AMBULATORY_CARE_PROVIDER_SITE_OTHER): Payer: Medicare Other | Admitting: General Practice

## 2014-04-01 DIAGNOSIS — I4891 Unspecified atrial fibrillation: Secondary | ICD-10-CM

## 2014-04-01 DIAGNOSIS — I2510 Atherosclerotic heart disease of native coronary artery without angina pectoris: Secondary | ICD-10-CM

## 2014-04-01 DIAGNOSIS — Z5181 Encounter for therapeutic drug level monitoring: Secondary | ICD-10-CM

## 2014-04-01 DIAGNOSIS — I10 Essential (primary) hypertension: Secondary | ICD-10-CM

## 2014-04-01 LAB — POCT INR: INR: 1.8

## 2014-04-01 NOTE — Progress Notes (Signed)
Pre visit review using our clinic review tool, if applicable. No additional management support is needed unless otherwise documented below in the visit note. 

## 2014-04-08 ENCOUNTER — Ambulatory Visit (INDEPENDENT_AMBULATORY_CARE_PROVIDER_SITE_OTHER): Payer: Medicare Other | Admitting: General Practice

## 2014-04-08 DIAGNOSIS — I10 Essential (primary) hypertension: Secondary | ICD-10-CM

## 2014-04-08 DIAGNOSIS — Z5181 Encounter for therapeutic drug level monitoring: Secondary | ICD-10-CM

## 2014-04-08 DIAGNOSIS — I2510 Atherosclerotic heart disease of native coronary artery without angina pectoris: Secondary | ICD-10-CM

## 2014-04-08 DIAGNOSIS — I4891 Unspecified atrial fibrillation: Secondary | ICD-10-CM

## 2014-04-08 LAB — POCT INR: INR: 2.7

## 2014-04-08 NOTE — Progress Notes (Signed)
Pre visit review using our clinic review tool, if applicable. No additional management support is needed unless otherwise documented below in the visit note. 

## 2014-04-29 ENCOUNTER — Ambulatory Visit (INDEPENDENT_AMBULATORY_CARE_PROVIDER_SITE_OTHER): Payer: Medicare Other | Admitting: Cardiology

## 2014-04-29 ENCOUNTER — Encounter (INDEPENDENT_AMBULATORY_CARE_PROVIDER_SITE_OTHER): Payer: Self-pay

## 2014-04-29 ENCOUNTER — Encounter: Payer: Self-pay | Admitting: Cardiology

## 2014-04-29 VITALS — BP 146/80 | HR 86 | Wt 140.0 lb

## 2014-04-29 DIAGNOSIS — I10 Essential (primary) hypertension: Secondary | ICD-10-CM

## 2014-04-29 DIAGNOSIS — Z7901 Long term (current) use of anticoagulants: Secondary | ICD-10-CM

## 2014-04-29 DIAGNOSIS — I2510 Atherosclerotic heart disease of native coronary artery without angina pectoris: Secondary | ICD-10-CM

## 2014-04-29 DIAGNOSIS — I4891 Unspecified atrial fibrillation: Secondary | ICD-10-CM
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
## 2014-04-29 NOTE — Patient Instructions (Addendum)
Continue your current therapy  I will see you in 6 months.   

## 2014-05-13 ENCOUNTER — Other Ambulatory Visit: Payer: Self-pay | Admitting: Cardiology

## 2014-05-17 ENCOUNTER — Other Ambulatory Visit: Payer: Self-pay | Admitting: Physician Assistant

## 2014-05-24 ENCOUNTER — Telehealth: Payer: Self-pay | Admitting: General Practice

## 2014-05-24 ENCOUNTER — Other Ambulatory Visit: Payer: Self-pay | Admitting: General Practice

## 2014-05-24 MED ORDER — WARFARIN SODIUM 5 MG PO TABS: ORAL_TABLET | ORAL | Status: DC

## 2014-05-24 NOTE — Telephone Encounter (Signed)
[facility]  [facility] for patient to call to schedule appointment for INR check.

## 2014-06-07 ENCOUNTER — Telehealth: Payer: Self-pay | Admitting: Family Medicine

## 2014-06-07 NOTE — Telephone Encounter (Signed)
Pt's son left vm requesting to set up INR check at [facility].  He has been released from [facility] where he was previously checked.  Left vm for pt's son to return call.

## 2014-06-20 ENCOUNTER — Other Ambulatory Visit: Payer: Self-pay | Admitting: Neurology

## 2014-06-21 ENCOUNTER — Encounter (INDEPENDENT_AMBULATORY_CARE_PROVIDER_SITE_OTHER): Payer: Self-pay

## 2014-06-21 ENCOUNTER — Encounter: Payer: Self-pay | Admitting: Neurology

## 2014-06-21 ENCOUNTER — Other Ambulatory Visit: Payer: Self-pay | Admitting: Cardiology

## 2014-06-21 ENCOUNTER — Ambulatory Visit (INDEPENDENT_AMBULATORY_CARE_PROVIDER_SITE_OTHER): Payer: Medicare Other | Admitting: Neurology

## 2014-06-21 VITALS — BP 133/69 | HR 75 | Wt 152.0 lb

## 2014-06-21 DIAGNOSIS — R2681 Unsteadiness on feet: Secondary | ICD-10-CM

## 2014-06-21 DIAGNOSIS — I2510 Atherosclerotic heart disease of native coronary artery without angina pectoris: Secondary | ICD-10-CM

## 2014-06-21 DIAGNOSIS — R569 Unspecified convulsions: Secondary | ICD-10-CM

## 2014-06-21 DIAGNOSIS — R269 Unspecified abnormalities of gait and mobility: Secondary | ICD-10-CM

## 2014-06-21 NOTE — Patient Instructions (Signed)
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
## 2014-07-01 ENCOUNTER — Ambulatory Visit (INDEPENDENT_AMBULATORY_CARE_PROVIDER_SITE_OTHER): Payer: Medicare Other | Admitting: General Practice

## 2014-07-01 DIAGNOSIS — Z7901 Long term (current) use of anticoagulants: Secondary | ICD-10-CM

## 2014-07-01 DIAGNOSIS — I4891 Unspecified atrial fibrillation: Secondary | ICD-10-CM

## 2014-07-01 DIAGNOSIS — Z5181 Encounter for therapeutic drug level monitoring: Secondary | ICD-10-CM

## 2014-07-01 LAB — POCT INR: INR: 3

## 2014-07-01 NOTE — Progress Notes (Signed)
Pre visit review using our clinic review tool, if applicable. No additional management support is needed unless otherwise documented below in the visit note. 

## 2014-07-29 ENCOUNTER — Other Ambulatory Visit (INDEPENDENT_AMBULATORY_CARE_PROVIDER_SITE_OTHER): Payer: Medicare Other

## 2014-07-29 ENCOUNTER — Other Ambulatory Visit: Payer: Self-pay | Admitting: Internal Medicine

## 2014-07-29 DIAGNOSIS — Z7901 Long term (current) use of anticoagulants: Secondary | ICD-10-CM

## 2014-07-29 DIAGNOSIS — I4819 Other persistent atrial fibrillation: Secondary | ICD-10-CM

## 2014-07-29 DIAGNOSIS — I4891 Unspecified atrial fibrillation: Secondary | ICD-10-CM

## 2014-07-29 LAB — PROTIME-INR
INR: 2.7 ratio — ABNORMAL HIGH (ref 0.8–1.0)
Prothrombin Time: 28.8 s — ABNORMAL HIGH (ref 9.6–13.1)

## 2014-08-12 NOTE — Telephone Encounter (Signed)
Noted  

## 2014-09-15 ENCOUNTER — Other Ambulatory Visit: Payer: Self-pay | Admitting: Neurology

## 2014-09-16 ENCOUNTER — Other Ambulatory Visit: Payer: Self-pay | Admitting: *Deleted

## 2014-09-16 MED ORDER — HYDRALAZINE HCL 100 MG PO TABS: ORAL_TABLET | ORAL | Status: DC

## 2014-09-23 ENCOUNTER — Other Ambulatory Visit (INDEPENDENT_AMBULATORY_CARE_PROVIDER_SITE_OTHER): Payer: Medicare Other

## 2014-09-23 ENCOUNTER — Ambulatory Visit (INDEPENDENT_AMBULATORY_CARE_PROVIDER_SITE_OTHER): Payer: Medicare Other | Admitting: Internal Medicine

## 2014-09-23 ENCOUNTER — Encounter: Payer: Self-pay | Admitting: Internal Medicine

## 2014-09-23 VITALS — BP 122/68 | HR 67 | Temp 97.6°F | Resp 20 | Ht 70.0 in | Wt 150.8 lb

## 2014-09-23 DIAGNOSIS — I482 Chronic atrial fibrillation, unspecified: Secondary | ICD-10-CM

## 2014-09-23 DIAGNOSIS — R2681 Unsteadiness on feet: Secondary | ICD-10-CM

## 2014-09-23 DIAGNOSIS — I10 Essential (primary) hypertension: Secondary | ICD-10-CM

## 2014-09-23 DIAGNOSIS — R569 Unspecified convulsions: Secondary | ICD-10-CM

## 2014-09-23 DIAGNOSIS — I2510 Atherosclerotic heart disease of native coronary artery without angina pectoris: Secondary | ICD-10-CM

## 2014-09-23 DIAGNOSIS — E291 Testicular hypofunction: Secondary | ICD-10-CM

## 2014-09-23 DIAGNOSIS — K219 Gastro-esophageal reflux disease without esophagitis: Secondary | ICD-10-CM

## 2014-09-23 DIAGNOSIS — K648 Other hemorrhoids: Secondary | ICD-10-CM

## 2014-09-23 DIAGNOSIS — E349 Endocrine disorder, unspecified: Secondary | ICD-10-CM

## 2014-09-23 LAB — PROTIME-INR
INR: 3.8 ratio — ABNORMAL HIGH (ref 0.8–1.0)
Prothrombin Time: 40.2 s — ABNORMAL HIGH (ref 9.6–13.1)

## 2014-09-23 MED ORDER — VITAMIN D (ERGOCALCIFEROL) 1.25 MG (50000 UNIT) PO CAPS: 50000.0000 [IU] | ORAL_CAPSULE | ORAL | Status: DC

## 2014-09-23 MED ORDER — HYDROCORTISONE 2.5 % RE CREA: 1.0000 "application " | TOPICAL_CREAM | Freq: Two times a day (BID) | RECTAL | Status: DC

## 2014-09-23 NOTE — Assessment & Plan Note (Signed)
He continues to follow with Dr. Jannifer Franklin and takes Keppra. No recent seizure activity.

## 2014-09-23 NOTE — Assessment & Plan Note (Signed)
Doing well with physical therapy and use of walker. He states he has not fallen in last 6 months.

## 2014-09-23 NOTE — Progress Notes (Signed)
   Subjective:    Patient ID: Joseph Bentley, male    DOB: Apr 18, 1926, 80 y.o.   MRN: [id number]  HPI The patient is an 80 year old man who has past medical history of atrial fib (on Coumadin), recurrent falls (doing well with walker currently), acid reflux, hypertension, seizure disorder, testosterone insufficiency. He has been having some constipation in the past several months but with addition of Dulcolax he is able to have bowel movement daily or every other day. The bowel movements are soft and not hard to pass he is not excessively straining. He does have hemorrhoids. They have some itchiness, burning when he passes bowels. No pain, no blood. He denies any other complaints. He has had a rough several years and has gone through divorce, housing change.  Review of Systems  Constitutional: Positive for activity change. Negative for fever, chills, appetite change and fatigue.  HENT: Negative.   Eyes: Negative.   Respiratory: Negative for cough, chest tightness, shortness of breath and wheezing.   Cardiovascular: Negative for chest pain, palpitations and leg swelling.  Gastrointestinal: Positive for constipation. Negative for nausea, vomiting, abdominal pain, diarrhea, blood in stool and abdominal distention.  Endocrine: Negative.   Genitourinary: Negative.   Musculoskeletal: Positive for gait problem. Negative for myalgias.  Skin: Negative.   Neurological: Negative for dizziness, weakness, light-headedness and headaches.      Objective:   Physical Exam  Constitutional: He is oriented to person, place, and time. He appears well-developed and well-nourished. No distress.  HENT:  Head: Normocephalic and atraumatic.  Eyes: EOM are normal.  Neck: Normal range of motion.  Cardiovascular: Normal rate.   Irregularly irregular  Pulmonary/Chest: Effort normal and breath sounds normal. No respiratory distress. He has no wheezes. He has no rales.  Abdominal: Soft. Bowel sounds are normal. He  exhibits no distension. There is no tenderness. There is no rebound.  Neurological: He is alert and oriented to person, place, and time. Coordination abnormal.  Uses walker well for balance  Skin: Skin is dry.   Filed Vitals:   09/23/14 1024  BP: 122/68  Pulse: 67  Temp: 97.6 F (36.4 C)  TempSrc: Oral  Resp: 20  Height: 5\' 10"  (1.778 m)  Weight: 150 lb 12.8 oz (68.402 kg)  SpO2: 95%      Assessment & Plan:  Flu shot given today.

## 2014-09-23 NOTE — Assessment & Plan Note (Signed)
Check PT/INR today. He will continue his beta blocker. Rate at goal today.

## 2014-09-23 NOTE — Assessment & Plan Note (Signed)
Does well with Protonix.

## 2014-09-23 NOTE — Assessment & Plan Note (Signed)
BP controlled on hydralazine, labetalol. We'll continue to monitor.

## 2014-09-23 NOTE — Assessment & Plan Note (Addendum)
Unclear history. Will look into the records further. Continue testosterone injections monthly for now.

## 2014-09-23 NOTE — Progress Notes (Signed)
Pre visit review using our clinic review tool, if applicable. No additional management support is needed unless otherwise documented below in the visit note. 

## 2014-09-23 NOTE — Assessment & Plan Note (Signed)
Rx given today for suppository for the burning and itching.

## 2014-09-23 NOTE — Patient Instructions (Signed)
We will check your PT/INR today for the coumadin.   We think you are doing great and have given you the medicine that you can use on your hemorrhoids when they get irritated with itching, burning, or pain.   Come back in about 6-12 months for a check up or call us sooner if you have problems or questions.

## 2014-09-23 NOTE — Addendum Note (Signed)
Addended by: Vertell Novak A on: 09/23/2014 05:39 PM   Modules accepted: Orders, Medications

## 2014-10-19 ENCOUNTER — Telehealth: Payer: Self-pay | Admitting: Cardiology

## 2014-10-19 ENCOUNTER — Telehealth: Payer: Self-pay | Admitting: Internal Medicine

## 2014-10-19 MED ORDER — WARFARIN SODIUM 5 MG PO TABS: ORAL_TABLET | ORAL | Status: DC

## 2014-10-19 NOTE — Telephone Encounter (Signed)
Joseph Bentley. Called in wanting to having the pt's warfarin refilled [facility]. Please call  Thanks

## 2014-10-19 NOTE — Telephone Encounter (Signed)
4:43 pm call came in from Greta Doom son, pt getting INR here--- needs [facility]in[facility] [address])Cardiologist office told pt's son to call [facility] to request refill (see phone note) Pt is out of Warfarin. Dr Doug Sou is not in office (his PCP) however she is not in the office today. Please advise.

## 2014-10-19 NOTE — Telephone Encounter (Signed)
INR checked at [facility] for son to call them for refill

## 2014-10-19 NOTE — Telephone Encounter (Signed)
done

## 2014-10-19 NOTE — Telephone Encounter (Signed)
New message     Pt needs to have his warfarin refilled----[facility] ---(not sure who to send this to at [facility])

## 2014-11-12 ENCOUNTER — Other Ambulatory Visit: Payer: Self-pay | Admitting: Internal Medicine

## 2014-11-17 ENCOUNTER — Other Ambulatory Visit: Payer: Self-pay | Admitting: Internal Medicine

## 2014-11-18 ENCOUNTER — Other Ambulatory Visit: Payer: Self-pay | Admitting: *Deleted

## 2014-11-18 MED ORDER — HYDRALAZINE HCL 100 MG PO TABS: ORAL_TABLET | ORAL | Status: DC

## 2014-11-19 ENCOUNTER — Other Ambulatory Visit: Payer: Self-pay | Admitting: Internal Medicine

## 2014-12-13 ENCOUNTER — Other Ambulatory Visit: Payer: Self-pay | Admitting: Cardiology

## 2014-12-13 MED ORDER — LABETALOL HCL 200 MG PO TABS: ORAL_TABLET | ORAL | Status: DC

## 2014-12-17 ENCOUNTER — Other Ambulatory Visit: Payer: Self-pay | Admitting: Internal Medicine

## 2014-12-22 ENCOUNTER — Ambulatory Visit: Payer: Medicare Other | Admitting: Adult Health

## 2014-12-30 ENCOUNTER — Telehealth: Payer: Self-pay | Admitting: Cardiology

## 2014-12-30 MED ORDER — WARFARIN SODIUM 5 MG PO TABS: ORAL_TABLET | ORAL | Status: DC

## 2014-12-30 NOTE — Telephone Encounter (Signed)
Returned call to patient's son he stated patient needs refill for coumadin.Advised will send message coumadin clinic for refill.Patient past due for INR.Advised keep appointment with Dr.Jordan 01/05/15 at 3:00 pm.

## 2014-12-30 NOTE — Telephone Encounter (Signed)
Please call,need talk to you about his medicine,also when does he need his next appt?

## 2015-01-05 ENCOUNTER — Ambulatory Visit (INDEPENDENT_AMBULATORY_CARE_PROVIDER_SITE_OTHER): Payer: Medicare Other | Admitting: Cardiology

## 2015-01-05 ENCOUNTER — Encounter: Payer: Self-pay | Admitting: Cardiology

## 2015-01-05 VITALS — BP 130/70 | HR 91 | Ht 69.0 in | Wt 153.8 lb

## 2015-01-05 DIAGNOSIS — I10 Essential (primary) hypertension: Secondary | ICD-10-CM

## 2015-01-05 DIAGNOSIS — I25810 Atherosclerosis of coronary artery bypass graft(s) without angina pectoris: Secondary | ICD-10-CM

## 2015-01-05 DIAGNOSIS — I4819 Other persistent atrial fibrillation: Secondary | ICD-10-CM

## 2015-01-05 DIAGNOSIS — I481 Persistent atrial fibrillation: Secondary | ICD-10-CM

## 2015-01-05 DIAGNOSIS — Z7901 Long term (current) use of anticoagulants: Secondary | ICD-10-CM

## 2015-01-05 NOTE — Patient Instructions (Signed)
Continue your current therapy  I will see you in 6 months.   
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
## 2015-01-17 ENCOUNTER — Other Ambulatory Visit: Payer: Self-pay | Admitting: *Deleted

## 2015-01-17 ENCOUNTER — Telehealth: Payer: Self-pay | Admitting: Family

## 2015-01-17 ENCOUNTER — Ambulatory Visit (INDEPENDENT_AMBULATORY_CARE_PROVIDER_SITE_OTHER): Payer: Medicare Other | Admitting: General Practice

## 2015-01-17 ENCOUNTER — Other Ambulatory Visit: Payer: Self-pay | Admitting: General Practice

## 2015-01-17 DIAGNOSIS — Z7901 Long term (current) use of anticoagulants: Secondary | ICD-10-CM

## 2015-01-17 LAB — POCT INR: INR: 2.1

## 2015-01-17 MED ORDER — WARFARIN SODIUM 5 MG PO TABS: ORAL_TABLET | ORAL | Status: DC

## 2015-01-17 MED ORDER — HYDRALAZINE HCL 100 MG PO TABS: ORAL_TABLET | ORAL | Status: DC

## 2015-01-17 MED ORDER — VITAMIN D (ERGOCALCIFEROL) 1.25 MG (50000 UNIT) PO CAPS: 50000.0000 [IU] | ORAL_CAPSULE | ORAL | Status: DC

## 2015-01-17 NOTE — Progress Notes (Signed)
Pre visit review using our clinic review tool, if applicable. No additional management support is needed unless otherwise documented below in the visit note. 

## 2015-01-17 NOTE — Telephone Encounter (Signed)
Agree with plan 

## 2015-01-18 ENCOUNTER — Other Ambulatory Visit: Payer: Self-pay | Admitting: General Practice

## 2015-01-18 ENCOUNTER — Other Ambulatory Visit: Payer: Self-pay | Admitting: Surgery

## 2015-01-18 MED ORDER — WARFARIN SODIUM 5 MG PO TABS: ORAL_TABLET | ORAL | Status: DC

## 2015-02-11 ENCOUNTER — Other Ambulatory Visit: Payer: Self-pay | Admitting: Neurology

## 2015-02-14 ENCOUNTER — Ambulatory Visit (INDEPENDENT_AMBULATORY_CARE_PROVIDER_SITE_OTHER): Payer: Medicare Other | Admitting: General Practice

## 2015-02-14 DIAGNOSIS — Z7901 Long term (current) use of anticoagulants: Secondary | ICD-10-CM

## 2015-02-14 LAB — POCT INR: INR: 2.6

## 2015-02-14 NOTE — Progress Notes (Signed)
Agree with plan 

## 2015-02-14 NOTE — Progress Notes (Signed)
Pre visit review using our clinic review tool, if applicable. No additional management support is needed unless otherwise documented below in the visit note. 

## 2015-03-03 ENCOUNTER — Telehealth: Payer: Self-pay

## 2015-03-03 NOTE — Telephone Encounter (Signed)
Advised pt to call back if he still wants flu vaccine

## 2015-03-13 ENCOUNTER — Other Ambulatory Visit: Payer: Self-pay | Admitting: Geriatric Medicine

## 2015-03-13 MED ORDER — WARFARIN SODIUM 5 MG PO TABS: ORAL_TABLET | ORAL | Status: DC

## 2015-03-14 ENCOUNTER — Ambulatory Visit (INDEPENDENT_AMBULATORY_CARE_PROVIDER_SITE_OTHER): Payer: Medicare Other | Admitting: General Practice

## 2015-03-14 DIAGNOSIS — Z7901 Long term (current) use of anticoagulants: Secondary | ICD-10-CM

## 2015-03-14 LAB — POCT INR: INR: 2.3

## 2015-03-14 NOTE — Progress Notes (Signed)
Agree with plan 

## 2015-03-14 NOTE — Progress Notes (Signed)
Pre visit review using our clinic review tool, if applicable. No additional management support is needed unless otherwise documented below in the visit note. 

## 2015-03-15 NOTE — Progress Notes (Signed)
Agree with plan 

## 2015-03-19 ENCOUNTER — Other Ambulatory Visit: Payer: Self-pay

## 2015-03-19 MED ORDER — LEVETIRACETAM 750 MG PO TABS: ORAL_TABLET | ORAL | Status: DC

## 2015-03-31 ENCOUNTER — Encounter (HOSPITAL_COMMUNITY): Payer: Self-pay | Admitting: Emergency Medicine

## 2015-03-31 ENCOUNTER — Emergency Department (INDEPENDENT_AMBULATORY_CARE_PROVIDER_SITE_OTHER)
Admission: EM | Admit: 2015-03-31 | Discharge: 2015-03-31 | Disposition: A | Discharge status: Home / Self Care | Payer: Medicare Other | Source: Home / Self Care | Attending: Family Medicine | Admitting: Family Medicine

## 2015-03-31 DIAGNOSIS — N4889 Other specified disorders of penis: Secondary | ICD-10-CM

## 2015-03-31 MED ORDER — MUPIROCIN 2 % EX OINT: 1.0000 "application " | TOPICAL_OINTMENT | Freq: Two times a day (BID) | CUTANEOUS | Status: DC
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
## 2015-03-31 NOTE — Discharge Instructions (Signed)
Thank you for coming in today. Apply the bactroban ointment to the skin twice or three times daily for 1 week.  Follow up with your doctor soon.  If the wound on the penis does not get better we will need to have it biopsied to look for a small skin cancer.  Return as needed.   Skin Ulcer A skin ulcer is an open sore that can be shallow or deep. Skin ulcers sometimes become infected and are difficult to treat. It may be 1 month or longer before real healing progress is made. CAUSES   Injury.  Problems with the veins or arteries.  Diabetes.  Insect bites.  Bedsores.  Inflammatory conditions. SYMPTOMS   Pain, redness, swelling, and tenderness around the ulcer.  Fever.  Bleeding from the ulcer.  Yellow or clear fluid coming from the ulcer. DIAGNOSIS  There are many types of skin ulcers. Any open sores will be examined. Certain tests will be done to determine the kind of ulcer you have. The right treatment depends on the type of ulcer you have. TREATMENT  Treatment is a long-term challenge. It may include:  Wearing an elastic wrap, compression stockings, or gel cast over the ulcer area.  Taking antibiotic medicines or putting antibiotic creams on the affected area if there is an infection. HOME CARE INSTRUCTIONS  Put on your bandages (dressings), wraps, or casts over the ulcer as directed by your caregiver.  Change all dressings as directed by your caregiver.  Take all medicines as directed by your caregiver.  Keep the affected area clean and dry.  Avoid injuries to the affected area.  Eat a well-balanced, healthy diet that includes plenty of fruit and vegetables.  If you smoke, consider quitting or decreasing the amount of cigarettes you smoke.  Once the ulcer heals, get regular exercise as directed by your caregiver.  Work with your caregiver to make sure your blood pressure, cholesterol, and diabetes are well-controlled.  Keep your skin moisturized. Dry skin  can crack and lead to skin ulcers. SEEK IMMEDIATE MEDICAL CARE IF:   Your pain gets worse.  You have swelling, redness, or fluids around the ulcer.  You have chills.  You have a fever. MAKE SURE YOU:   Understand these instructions.  Will watch your condition.  Will get help right away if you are not doing well or get worse. Document Released: 01/09/2005 Document Revised: 02/24/2012 Document Reviewed: 07/19/2011 [facility] Patient Information 2015 [place], [place]. This information is not intended to replace advice given to you by your health care provider. Make sure you discuss any questions you have with your health care provider.

## 2015-03-31 NOTE — ED Notes (Signed)
Pt says he is having bleeding from his penis and scrotum, "from the outside."  He is also reporting discomfort from "the bag under his scrotum".  Pt states the bleeding is minimal but doesn't really stop.  He is on Coumadin and last had his levels checked March 29 and they were within normal limits.

## 2015-04-05 ENCOUNTER — Telehealth: Payer: Self-pay | Admitting: *Deleted

## 2015-04-05 NOTE — Telephone Encounter (Signed)
[facility] - Client TELEPHONE [place] [facility] Patient Name: Joseph Bentley Gender: Male DOB: November 03, 1926 Age: 81 Y 2 M 22 D Return Phone Number: [phone] (Primary) Address: City/State/Zip: [place] Client [facility] - Client Client Site [place] Physician [place], [place] Type Call Call Type Triage / Clinical Caller Name Thurmond Hildebran Relationship To Patient Son Return Phone Number [phone] (Primary) Chief Complaint Penis Symptoms Initial Comment caller states pt has bleeding from penis, and penile pain; NAME ALERT: pt and caller have the same first and last name; Nurse Assessment Nurse: Vevelyn Royals, RN, Verdis Frederickson Date/Time ([place] Time): 03/31/2015 5:52:07 PM Confirm and document reason for call. If symptomatic, describe symptoms. ---Caller states pt has bleeding from penis, and penile pain; NAME ALERT: pt and caller have the same first and last name. Caller states patient has a hearing problem. Patient lives alone and caller states patient can not hear on the phone. Caller is out of town. He has lined up a family member to take patient to [facility] on [address] states patient's penis is sensitive and has blood on the outside. States patient is on Warfarin. Nurse is unable to triage patient's symptoms at this time, but he is on his way to an UC now. Has the patient traveled out of the country within the last 30 days? ---Not Applicable Does the patient require triage? ---Declined Triage Guidelines Guideline Title Affirmed Question Affirmed Notes Nurse Date/Time ([place] Time) Disp. Time Eilene Ghazi Time) Disposition Final User 03/31/2015 5:25:23 PM Send To Clinical Follow Up [place] 03/31/2015 5:35:29 PM Send To RN Personal Vevelyn Royals, RN, Verdis Frederickson 03/31/2015 6:03:47 PM Clinical Call Yes Vevelyn Royals, RN, Verdis Frederickson After Care Instructions Given Call Event Type User Date /  Time Description

## 2015-04-11 ENCOUNTER — Ambulatory Visit (INDEPENDENT_AMBULATORY_CARE_PROVIDER_SITE_OTHER): Payer: Medicare Other | Admitting: General Practice

## 2015-04-11 DIAGNOSIS — Z7901 Long term (current) use of anticoagulants: Secondary | ICD-10-CM | POA: Diagnosis not present

## 2015-04-11 LAB — POCT INR: INR: 2.7

## 2015-04-11 NOTE — Progress Notes (Signed)
Agree with plan 

## 2015-04-11 NOTE — Progress Notes (Signed)
Pre visit review using our clinic review tool, if applicable. No additional management support is needed unless otherwise documented below in the visit note. 

## 2015-04-21 ENCOUNTER — Other Ambulatory Visit: Payer: Self-pay | Admitting: Internal Medicine

## 2015-05-16 ENCOUNTER — Other Ambulatory Visit: Payer: Self-pay | Admitting: Internal Medicine

## 2015-05-17 ENCOUNTER — Other Ambulatory Visit: Payer: Self-pay | Admitting: Internal Medicine

## 2015-05-18 ENCOUNTER — Other Ambulatory Visit: Payer: Self-pay

## 2015-05-23 ENCOUNTER — Ambulatory Visit (INDEPENDENT_AMBULATORY_CARE_PROVIDER_SITE_OTHER): Payer: Medicare Other | Admitting: *Deleted

## 2015-05-23 DIAGNOSIS — Z7901 Long term (current) use of anticoagulants: Secondary | ICD-10-CM

## 2015-05-23 LAB — POCT INR: INR: 2.1

## 2015-05-23 NOTE — Progress Notes (Signed)
Pre visit review using our clinic review tool, if applicable. No additional management support is needed unless otherwise documented below in the visit note. 

## 2015-05-24 NOTE — Progress Notes (Signed)
I have reviewed and agree with the plan. 

## 2015-06-07 ENCOUNTER — Other Ambulatory Visit: Payer: Self-pay | Admitting: Neurology

## 2015-07-04 ENCOUNTER — Ambulatory Visit (INDEPENDENT_AMBULATORY_CARE_PROVIDER_SITE_OTHER): Payer: Medicare Other | Admitting: General Practice

## 2015-07-04 DIAGNOSIS — Z7901 Long term (current) use of anticoagulants: Secondary | ICD-10-CM | POA: Diagnosis not present

## 2015-07-04 DIAGNOSIS — I4891 Unspecified atrial fibrillation: Secondary | ICD-10-CM | POA: Diagnosis not present

## 2015-07-04 LAB — POCT INR: INR: 2.8

## 2015-07-04 NOTE — Progress Notes (Signed)
I have reviewed and agree with the plan. 

## 2015-07-04 NOTE — Progress Notes (Signed)
Pre visit review using our clinic review tool, if applicable. No additional management support is needed unless otherwise documented below in the visit note. 

## 2015-07-10 ENCOUNTER — Other Ambulatory Visit: Payer: Self-pay | Admitting: Internal Medicine

## 2015-07-11 ENCOUNTER — Other Ambulatory Visit: Payer: Self-pay | Admitting: Neurology

## 2015-07-11 ENCOUNTER — Other Ambulatory Visit: Payer: Self-pay

## 2015-07-11 ENCOUNTER — Other Ambulatory Visit: Payer: Self-pay | Admitting: Internal Medicine

## 2015-07-11 MED ORDER — LABETALOL HCL 200 MG PO TABS: ORAL_TABLET | ORAL | Status: DC

## 2015-07-12 ENCOUNTER — Other Ambulatory Visit: Payer: Self-pay

## 2015-07-12 MED ORDER — PANTOPRAZOLE SODIUM 40 MG PO TBEC: 40.0000 mg | DELAYED_RELEASE_TABLET | Freq: Two times a day (BID) | ORAL | Status: DC

## 2015-08-15 ENCOUNTER — Ambulatory Visit (INDEPENDENT_AMBULATORY_CARE_PROVIDER_SITE_OTHER): Payer: Medicare Other | Admitting: General Practice

## 2015-08-15 DIAGNOSIS — Z7901 Long term (current) use of anticoagulants: Secondary | ICD-10-CM

## 2015-08-15 DIAGNOSIS — I4891 Unspecified atrial fibrillation: Secondary | ICD-10-CM | POA: Diagnosis not present

## 2015-08-15 DIAGNOSIS — Z23 Encounter for immunization: Secondary | ICD-10-CM | POA: Diagnosis not present

## 2015-08-15 LAB — POCT INR: INR: 3.3

## 2015-08-15 NOTE — Progress Notes (Signed)
Pre visit review using our clinic review tool, if applicable. No additional management support is needed unless otherwise documented below in the visit note. 

## 2015-08-15 NOTE — Progress Notes (Signed)
I have reviewed and agree with the plan. 

## 2015-09-05 ENCOUNTER — Other Ambulatory Visit: Payer: Self-pay | Admitting: Internal Medicine

## 2015-09-07 ENCOUNTER — Other Ambulatory Visit: Payer: Self-pay | Admitting: Neurology

## 2015-09-07 MED ORDER — LEVETIRACETAM 750 MG PO TABS
750.0000 mg | ORAL_TABLET | Freq: Two times a day (BID) | ORAL | Status: DC
Start: 2015-09-07 — End: 2015-09-14

## 2015-09-07 NOTE — Telephone Encounter (Signed)
I called patient and left a voicemail that we need to schedule a f/u appointment with him.

## 2015-09-08 ENCOUNTER — Other Ambulatory Visit: Payer: Self-pay | Admitting: Internal Medicine

## 2015-09-14 ENCOUNTER — Ambulatory Visit: Payer: Medicare Other | Admitting: Neurology

## 2015-09-14 ENCOUNTER — Encounter: Payer: Self-pay | Admitting: Nurse Practitioner

## 2015-09-14 ENCOUNTER — Ambulatory Visit (INDEPENDENT_AMBULATORY_CARE_PROVIDER_SITE_OTHER): Payer: Medicare Other | Admitting: Nurse Practitioner

## 2015-09-14 VITALS — BP 129/69 | HR 85 | Ht 66.0 in | Wt 147.0 lb

## 2015-09-14 DIAGNOSIS — R569 Unspecified convulsions: Secondary | ICD-10-CM | POA: Diagnosis not present

## 2015-09-14 DIAGNOSIS — I25810 Atherosclerosis of coronary artery bypass graft(s) without angina pectoris: Secondary | ICD-10-CM | POA: Diagnosis not present

## 2015-09-14 MED ORDER — LEVETIRACETAM 750 MG PO TABS: 750.0000 mg | ORAL_TABLET | Freq: Two times a day (BID) | ORAL | Status: DC

## 2015-09-14 NOTE — Patient Instructions (Signed)
Continue Keppra at current dose, will refill for 1 year Use walker at all times for safe ambulation F/U yearly
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
## 2015-09-14 NOTE — Progress Notes (Signed)
I have read the note, and I agree with the clinical assessment and plan.  WILLIS,CHARLES KEITH   

## 2015-09-26 ENCOUNTER — Ambulatory Visit (INDEPENDENT_AMBULATORY_CARE_PROVIDER_SITE_OTHER): Payer: Medicare Other | Admitting: General Practice

## 2015-09-26 DIAGNOSIS — Z7901 Long term (current) use of anticoagulants: Secondary | ICD-10-CM | POA: Diagnosis not present

## 2015-09-26 LAB — POCT INR: INR: 3.8

## 2015-09-26 NOTE — Progress Notes (Signed)
Pre visit review using our clinic review tool, if applicable. No additional management support is needed unless otherwise documented below in the visit note. 

## 2015-09-26 NOTE — Progress Notes (Signed)
I have reviewed and agree with the plan. 

## 2015-10-09 ENCOUNTER — Other Ambulatory Visit: Payer: Self-pay | Admitting: Internal Medicine

## 2015-10-24 ENCOUNTER — Ambulatory Visit (INDEPENDENT_AMBULATORY_CARE_PROVIDER_SITE_OTHER): Payer: Medicare Other | Admitting: General Practice

## 2015-10-24 DIAGNOSIS — Z7901 Long term (current) use of anticoagulants: Secondary | ICD-10-CM

## 2015-10-24 LAB — POCT INR: INR: 2.3

## 2015-10-24 NOTE — Progress Notes (Signed)
I have reviewed and agree with the plan. 

## 2015-10-24 NOTE — Progress Notes (Signed)
Pre visit review using our clinic review tool, if applicable. No additional management support is needed unless otherwise documented below in the visit note. 

## 2015-11-06 ENCOUNTER — Other Ambulatory Visit: Payer: Self-pay | Admitting: Internal Medicine

## 2015-11-07 ENCOUNTER — Other Ambulatory Visit: Payer: Self-pay | Admitting: Internal Medicine

## 2015-11-07 ENCOUNTER — Ambulatory Visit: Payer: Medicare Other | Admitting: Cardiology

## 2015-11-21 ENCOUNTER — Ambulatory Visit: Payer: Medicare Other

## 2015-11-28 ENCOUNTER — Ambulatory Visit (INDEPENDENT_AMBULATORY_CARE_PROVIDER_SITE_OTHER): Payer: Medicare Other | Admitting: General Practice

## 2015-11-28 DIAGNOSIS — Z7901 Long term (current) use of anticoagulants: Secondary | ICD-10-CM

## 2015-11-28 LAB — POCT INR: INR: 2.2

## 2015-11-28 NOTE — Progress Notes (Signed)
Pre visit review using our clinic review tool, if applicable. No additional management support is needed unless otherwise documented below in the visit note. 

## 2015-11-28 NOTE — Progress Notes (Signed)
I have reviewed and agree with the plan. 

## 2015-12-12 ENCOUNTER — Other Ambulatory Visit: Payer: Self-pay | Admitting: Internal Medicine

## 2015-12-14 ENCOUNTER — Other Ambulatory Visit: Payer: Self-pay | Admitting: Internal Medicine

## 2015-12-26 ENCOUNTER — Ambulatory Visit: Payer: Medicare Other

## 2016-01-01 ENCOUNTER — Encounter: Payer: Self-pay | Admitting: Internal Medicine

## 2016-01-01 ENCOUNTER — Ambulatory Visit (INDEPENDENT_AMBULATORY_CARE_PROVIDER_SITE_OTHER): Payer: Medicare Other | Admitting: Internal Medicine

## 2016-01-01 ENCOUNTER — Other Ambulatory Visit (INDEPENDENT_AMBULATORY_CARE_PROVIDER_SITE_OTHER): Payer: Medicare Other

## 2016-01-01 VITALS — BP 138/60 | HR 65 | Temp 97.9°F | Resp 18 | Ht 67.0 in | Wt 151.0 lb

## 2016-01-01 DIAGNOSIS — E785 Hyperlipidemia, unspecified: Secondary | ICD-10-CM

## 2016-01-01 DIAGNOSIS — E559 Vitamin D deficiency, unspecified: Secondary | ICD-10-CM

## 2016-01-01 DIAGNOSIS — I482 Chronic atrial fibrillation, unspecified: Secondary | ICD-10-CM

## 2016-01-01 DIAGNOSIS — I10 Essential (primary) hypertension: Secondary | ICD-10-CM | POA: Diagnosis not present

## 2016-01-01 DIAGNOSIS — Z0000 Encounter for general adult medical examination without abnormal findings: Secondary | ICD-10-CM

## 2016-01-01 DIAGNOSIS — Z23 Encounter for immunization: Secondary | ICD-10-CM

## 2016-01-01 LAB — LIPID PANEL
CHOLESTEROL: 212 mg/dL — AB (ref 0–200)
HDL: 43.4 mg/dL (ref >39.00)
LDL Cholesterol: 135 mg/dL — ABNORMAL HIGH (ref 0–99)
NonHDL: 168.29
TRIGLYCERIDES: 164 mg/dL — AB (ref 0.0–149.0)
Total CHOL/HDL Ratio: 5
VLDL: 32.8 mg/dL (ref 0.0–40.0)

## 2016-01-01 LAB — COMPREHENSIVE METABOLIC PANEL
ALK PHOS: 106 U/L (ref 39–117)
ALT: 9 U/L (ref 0–53)
AST: 19 U/L (ref 0–37)
Albumin: 4.3 g/dL (ref 3.5–5.2)
BUN: 23 mg/dL (ref 6–23)
CO2: 26 mEq/L (ref 19–32)
Calcium: 9.5 mg/dL (ref 8.4–10.5)
Chloride: 100 mEq/L (ref 96–112)
Creatinine, Ser: 0.98 mg/dL (ref 0.40–1.50)
GFR: 76.72 mL/min (ref >60.00)
GLUCOSE: 89 mg/dL (ref 70–99)
POTASSIUM: 4.5 meq/L (ref 3.5–5.1)
Sodium: 138 mEq/L (ref 135–145)
TOTAL PROTEIN: 7 g/dL (ref 6.0–8.3)
Total Bilirubin: 0.4 mg/dL (ref 0.2–1.2)

## 2016-01-01 LAB — PROTIME-INR
INR: 2.4 ratio — ABNORMAL HIGH (ref 0.8–1.0)
Prothrombin Time: 26 s — ABNORMAL HIGH (ref 9.6–13.1)

## 2016-01-01 NOTE — Patient Instructions (Addendum)
We have given you the pneumonia booster shot today.   We will check the blood work and call you back with the results.   You can also try changing the protonix to once a day and see how that does with your stomach. If you get more heart burn pains go back to taking twice a day.  Health Maintenance, Male A healthy lifestyle and preventative care can promote health and wellness.  Maintain regular health, dental, and eye exams.  Eat a healthy diet. Foods like vegetables, fruits, whole grains, low-fat dairy products, and lean protein foods contain the nutrients you need and are low in calories. Decrease your intake of foods high in solid fats, added sugars, and salt. Get information about a proper diet from your health care provider, if necessary.  Regular physical exercise is one of the most important things you can do for your health. Most adults should get at least 150 minutes of moderate-intensity exercise (any activity that increases your heart rate and causes you to sweat) each week. In addition, most adults need muscle-strengthening exercises on 2 or more days a week.   Maintain a healthy weight. The body mass index (BMI) is a screening tool to identify possible weight problems. It provides an estimate of body fat based on height and weight. Your health care provider can find your BMI and can help you achieve or maintain a healthy weight. For males 20 years and older:  A BMI below 18.5 is considered underweight.  A BMI of 18.5 to 24.9 is normal.  A BMI of 25 to 29.9 is considered overweight.  A BMI of 30 and above is considered obese.  Maintain normal blood lipids and cholesterol by exercising and minimizing your intake of saturated fat. Eat a balanced diet with plenty of fruits and vegetables. Blood tests for lipids and cholesterol should begin at age 68 and be repeated every 5 years. If your lipid or cholesterol levels are high, you are over age 54, or you are at high risk for heart  disease, you may need your cholesterol levels checked more frequently.Ongoing high lipid and cholesterol levels should be treated with medicines if diet and exercise are not working.  If you smoke, find out from your health care provider how to quit. If you do not use tobacco, do not start.  Lung cancer screening is recommended for adults aged 62-80 years who are at high risk for developing lung cancer because of a history of smoking. A yearly low-dose CT scan of the lungs is recommended for people who have at least a 30-pack-year history of smoking and are current smokers or have quit within the past 15 years. A pack year of smoking is smoking an average of 1 pack of cigarettes a day for 1 year (for example, a 30-pack-year history of smoking could mean smoking 1 pack a day for 30 years or 2 packs a day for 15 years). Yearly screening should continue until the smoker has stopped smoking for at least 15 years. Yearly screening should be stopped for people who develop a health problem that would prevent them from having lung cancer treatment.  If you choose to drink alcohol, do not have more than 2 drinks per day. One drink is considered to be 12 oz (360 mL) of beer, 5 oz (150 mL) of wine, or 1.5 oz (45 mL) of liquor.  Avoid the use of street drugs. Do not share needles with anyone. Ask for help if you need support  or instructions about stopping the use of drugs.  High blood pressure causes heart disease and increases the risk of stroke. High blood pressure is more likely to develop in:  People who have blood pressure in the end of the normal range (100-139/85-89 mm Hg).  People who are overweight or obese.  People who are African American.  If you are 23-22 years of age, have your blood pressure checked every 3-5 years. If you are 10 years of age or older, have your blood pressure checked every year. You should have your blood pressure measured twice--once when you are at a hospital or clinic, and  once when you are not at a hospital or clinic. Record the average of the two measurements. To check your blood pressure when you are not at a hospital or clinic, you can use:  An automated blood pressure machine at a pharmacy.  A home blood pressure monitor.  If you are 28-45 years old, ask your health care provider if you should take aspirin to prevent heart disease.  Diabetes screening involves taking a blood sample to check your fasting blood sugar level. This should be done once every 3 years after age 66 if you are at a normal weight and without risk factors for diabetes. Testing should be considered at a younger age or be carried out more frequently if you are overweight and have at least 1 risk factor for diabetes.  Colorectal cancer can be detected and often prevented. Most routine colorectal cancer screening begins at the age of 12 and continues through age 31. However, your health care provider may recommend screening at an earlier age if you have risk factors for colon cancer. On a yearly basis, your health care provider may provide home test kits to check for hidden blood in the stool. A small camera at the end of a tube may be used to directly examine the colon (sigmoidoscopy or colonoscopy) to detect the earliest forms of colorectal cancer. Talk to your health care provider about this at age 69 when routine screening begins. A direct exam of the colon should be repeated every 5-10 years through age 3, unless early forms of precancerous polyps or small growths are found.  People who are at an increased risk for hepatitis B should be screened for this virus. You are considered at high risk for hepatitis B if:  You were born in a country where hepatitis B occurs often. Talk with your health care provider about which countries are considered high risk.  Your parents were born in a high-risk country and you have not received a shot to protect against hepatitis B (hepatitis B  vaccine).  You have HIV or AIDS.  You use needles to inject street drugs.  You live with, or have sex with, someone who has hepatitis B.  You are a man who has sex with other men (MSM).  You get hemodialysis treatment.  You take certain medicines for conditions like cancer, organ transplantation, and autoimmune conditions.  Hepatitis C blood testing is recommended for all people born from 18 through 1965 and any individual with known risk factors for hepatitis C.  Healthy men should no longer receive prostate-specific antigen (PSA) blood tests as part of routine cancer screening. Talk to your health care provider about prostate cancer screening.  Testicular cancer screening is not recommended for adolescents or adult males who have no symptoms. Screening includes self-exam, a health care provider exam, and other screening tests. Consult with your health  care provider about any symptoms you have or any concerns you have about testicular cancer.  Practice safe sex. Use condoms and avoid high-risk sexual practices to reduce the spread of sexually transmitted infections (STIs).  You should be screened for STIs, including gonorrhea and chlamydia if:  You are sexually active and are younger than 24 years.  You are older than 24 years, and your health care provider tells you that you are at risk for this type of infection.  Your sexual activity has changed since you were last screened, and you are at an increased risk for chlamydia or gonorrhea. Ask your health care provider if you are at risk.  If you are at risk of being infected with HIV, it is recommended that you take a prescription medicine daily to prevent HIV infection. This is called pre-exposure prophylaxis (PrEP). You are considered at risk if:  You are a man who has sex with other men (MSM).  You are a heterosexual man who is sexually active with multiple partners.  You take drugs by injection.  You are sexually active  with a partner who has HIV.  Talk with your health care provider about whether you are at high risk of being infected with HIV. If you choose to begin PrEP, you should first be tested for HIV. You should then be tested every 3 months for as long as you are taking PrEP.  Use sunscreen. Apply sunscreen liberally and repeatedly throughout the day. You should seek shade when your shadow is shorter than you. Protect yourself by wearing long sleeves, pants, a wide-brimmed hat, and sunglasses year round whenever you are outdoors.  Tell your health care provider of new moles or changes in moles, especially if there is a change in shape or color. Also, tell your health care provider if a mole is larger than the size of a pencil eraser.  A one-time screening for abdominal aortic aneurysm (AAA) and surgical repair of large AAAs by ultrasound is recommended for men aged 69-75 years who are current or former smokers.  Stay current with your vaccines (immunizations).   This information is not intended to replace advice given to you by your health care provider. Make sure you discuss any questions you have with your health care provider.   Document Released: 05/30/2008 Document Revised: 12/23/2014 Document Reviewed: 04/29/2011 Elsevier Interactive Patient Education [employer].

## 2016-01-01 NOTE — Progress Notes (Signed)
   Subjective:    Patient ID: Joseph Bentley, male    DOB: 1926-02-03, 82 y.o.   MRN: [id number]  HPI Here for medicare wellness, no new complaints. Please see A/P for status and treatment of chronic medical problems.   HPI # 2: Has history of vitamin D deficiency and has been taking high dose vitamin D for years. Denies complaints or concerns. No recent falls. No fractures.   Diet: heart healthy Physical activity: sedentary Depression/mood screen: negative Hearing: intact to whispered voice with hearing aids Visual acuity: grossly normal with lens, performs annual eye exam  ADLs: capable, lives in assisted living Fall risk: low, uses walker at all times Home safety: good Cognitive evaluation: intact to orientation, naming, recall and repetition EOL planning: adv directives discussed  I have personally reviewed and have noted 1. The patient's medical and social history - reviewed today no changes 2. Their use of alcohol, tobacco or illicit drugs 3. Their current medications and supplements 4. The patient's functional ability including ADL's, fall risks, home safety risks and hearing or visual impairment. 5. Diet and physical activities 6. Evidence for depression or mood disorders 7. Care team reviewed and updated (available in snapshot)  Review of Systems  Constitutional: Positive for activity change. Negative for fever, chills, appetite change and fatigue.  HENT: Negative.   Eyes: Negative.   Respiratory: Negative for cough, chest tightness, shortness of breath and wheezing.   Cardiovascular: Negative for chest pain, palpitations and leg swelling.  Gastrointestinal: Negative for nausea, vomiting, abdominal pain, diarrhea, constipation, blood in stool and abdominal distention.  Musculoskeletal: Positive for gait problem. Negative for myalgias.  Skin: Negative.   Neurological: Negative for dizziness, weakness, light-headedness and headaches.  Psychiatric/Behavioral: Negative.         Objective:   Physical Exam  Constitutional: He is oriented to person, place, and time. He appears well-developed and well-nourished. No distress.  HENT:  Head: Normocephalic and atraumatic.  Eyes: EOM are normal.  Neck: Normal range of motion.  Cardiovascular: Normal rate.   Murmur heard. Irregularly irregular  Pulmonary/Chest: Effort normal and breath sounds normal. No respiratory distress. He has no wheezes. He has no rales.  Abdominal: Soft. Bowel sounds are normal. He exhibits no distension. There is no tenderness. There is no rebound.  Neurological: He is alert and oriented to person, place, and time. Coordination abnormal.  Uses walker well for balance  Skin: Skin is dry.   Filed Vitals:   01/01/16 1510  BP: 138/60  Pulse: 65  Temp: 97.9 F (36.6 C)  TempSrc: Oral  Resp: 18  Height: 5\' 7"  (1.702 m)  Weight: 151 lb (68.493 kg)  SpO2: 96%      Assessment & Plan:  Prevnar 13 given at visit

## 2016-01-01 NOTE — Progress Notes (Signed)
Pre visit review using our clinic review tool, if applicable. No additional management support is needed unless otherwise documented below in the visit note. 

## 2016-01-02 DIAGNOSIS — E559 Vitamin D deficiency, unspecified: Secondary | ICD-10-CM | POA: Insufficient documentation

## 2016-01-02 DIAGNOSIS — Z0000 Encounter for general adult medical examination without abnormal findings: Secondary | ICD-10-CM | POA: Insufficient documentation

## 2016-01-02 LAB — VITAMIN D 25 HYDROXY (VIT D DEFICIENCY, FRACTURES): VITD: 60.12 ng/mL (ref 30.00–100.00)

## 2016-01-02 NOTE — Assessment & Plan Note (Signed)
BP controlled on hydralazine and labetalol. HR at goal. Checking CMP and adjust as needed.

## 2016-01-02 NOTE — Assessment & Plan Note (Signed)
Checking vitamin D levels today and will likely stop vitamin D if levels are adequate as he has been on for years.

## 2016-01-02 NOTE — Assessment & Plan Note (Signed)
Aged out of colon cancer screening, immunizations up to date and given prevnar 60 today to finish pneumonia series. Checking labs and adjust as needed. Counseled on home safety and the need to use his walker at all times. Given 10 year screening recommendations.

## 2016-01-02 NOTE — Assessment & Plan Note (Signed)
On beta blocker for rate control, taking coumadin for anticoagulation. Checking INR today and adjust as needed. Will continue to follow with the coumadin clinic.

## 2016-01-03 ENCOUNTER — Ambulatory Visit: Payer: Medicare Other

## 2016-01-09 ENCOUNTER — Other Ambulatory Visit: Payer: Self-pay

## 2016-01-09 NOTE — Telephone Encounter (Signed)
Medication Detail      Disp Refills Start End     hydrALAZINE (APRESOLINE) 100 MG tablet 90 tablet 11 01/17/2015     Sig: TAKE 1 TABLET (100 MG TOTAL) BY MOUTH 3 (THREE) TIMES DAILY.    E-Prescribing Status: Receipt confirmed by pharmacy (01/17/2015 10:04 AM EST)     Pharmacy    [facility] #[id number] [address], [place]

## 2016-01-11 ENCOUNTER — Other Ambulatory Visit: Payer: Self-pay | Admitting: *Deleted

## 2016-01-11 MED ORDER — HYDRALAZINE HCL 100 MG PO TABS: ORAL_TABLET | ORAL | Status: DC

## 2016-01-29 ENCOUNTER — Other Ambulatory Visit: Payer: Self-pay | Admitting: *Deleted

## 2016-01-29 MED ORDER — HYDRALAZINE HCL 100 MG PO TABS: ORAL_TABLET | ORAL | Status: DC

## 2016-02-02 ENCOUNTER — Ambulatory Visit: Payer: Medicare Other | Admitting: Cardiology

## 2016-02-07 ENCOUNTER — Other Ambulatory Visit: Payer: Self-pay | Admitting: Internal Medicine

## 2016-02-14 ENCOUNTER — Other Ambulatory Visit: Payer: Self-pay | Admitting: *Deleted

## 2016-02-14 MED ORDER — LABETALOL HCL 200 MG PO TABS: ORAL_TABLET | ORAL | Status: DC

## 2016-02-14 MED ORDER — PANTOPRAZOLE SODIUM 40 MG PO TBEC: 40.0000 mg | DELAYED_RELEASE_TABLET | Freq: Two times a day (BID) | ORAL | Status: DC

## 2016-02-14 MED ORDER — HYDRALAZINE HCL 100 MG PO TABS: ORAL_TABLET | ORAL | Status: DC

## 2016-03-08 ENCOUNTER — Other Ambulatory Visit: Payer: Self-pay | Admitting: Internal Medicine

## 2016-04-03 ENCOUNTER — Emergency Department (HOSPITAL_COMMUNITY): Payer: Medicare Other

## 2016-04-03 ENCOUNTER — Emergency Department (HOSPITAL_COMMUNITY)
Admission: EM | Admit: 2016-04-03 | Discharge: 2016-04-03 | Disposition: A | Discharge status: Home / Self Care | Payer: Medicare Other | Attending: Emergency Medicine | Admitting: Emergency Medicine

## 2016-04-03 ENCOUNTER — Telehealth: Payer: Self-pay | Admitting: Cardiology

## 2016-04-03 DIAGNOSIS — Z79899 Other long term (current) drug therapy: Secondary | ICD-10-CM | POA: Insufficient documentation

## 2016-04-03 DIAGNOSIS — I509 Heart failure, unspecified: Secondary | ICD-10-CM | POA: Diagnosis not present

## 2016-04-03 DIAGNOSIS — Y9289 Other specified places as the place of occurrence of the external cause: Secondary | ICD-10-CM | POA: Diagnosis not present

## 2016-04-03 DIAGNOSIS — I4891 Unspecified atrial fibrillation: Secondary | ICD-10-CM | POA: Insufficient documentation

## 2016-04-03 DIAGNOSIS — W19XXXA Unspecified fall, initial encounter: Secondary | ICD-10-CM | POA: Insufficient documentation

## 2016-04-03 DIAGNOSIS — Y939 Activity, unspecified: Secondary | ICD-10-CM | POA: Insufficient documentation

## 2016-04-03 DIAGNOSIS — Z8673 Personal history of transient ischemic attack (TIA), and cerebral infarction without residual deficits: Secondary | ICD-10-CM | POA: Insufficient documentation

## 2016-04-03 DIAGNOSIS — I110 Hypertensive heart disease with heart failure: Secondary | ICD-10-CM | POA: Diagnosis not present

## 2016-04-03 DIAGNOSIS — F329 Major depressive disorder, single episode, unspecified: Secondary | ICD-10-CM | POA: Insufficient documentation

## 2016-04-03 DIAGNOSIS — Z87891 Personal history of nicotine dependence: Secondary | ICD-10-CM | POA: Insufficient documentation

## 2016-04-03 DIAGNOSIS — S32501A Unspecified fracture of right pubis, initial encounter for closed fracture: Secondary | ICD-10-CM | POA: Insufficient documentation

## 2016-04-03 DIAGNOSIS — Z7901 Long term (current) use of anticoagulants: Secondary | ICD-10-CM | POA: Diagnosis not present

## 2016-04-03 DIAGNOSIS — H9193 Unspecified hearing loss, bilateral: Secondary | ICD-10-CM | POA: Insufficient documentation

## 2016-04-03 DIAGNOSIS — G40409 Other generalized epilepsy and epileptic syndromes, not intractable, without status epilepticus: Secondary | ICD-10-CM | POA: Diagnosis not present

## 2016-04-03 DIAGNOSIS — I25709 Atherosclerosis of coronary artery bypass graft(s), unspecified, with unspecified angina pectoris: Secondary | ICD-10-CM | POA: Diagnosis not present

## 2016-04-03 DIAGNOSIS — Y999 Unspecified external cause status: Secondary | ICD-10-CM | POA: Insufficient documentation

## 2016-04-03 DIAGNOSIS — S32591A Other specified fracture of right pubis, initial encounter for closed fracture: Secondary | ICD-10-CM

## 2016-04-03 DIAGNOSIS — K219 Gastro-esophageal reflux disease without esophagitis: Secondary | ICD-10-CM | POA: Diagnosis not present

## 2016-04-03 DIAGNOSIS — N400 Enlarged prostate without lower urinary tract symptoms: Secondary | ICD-10-CM | POA: Diagnosis not present

## 2016-04-03 DIAGNOSIS — E291 Testicular hypofunction: Secondary | ICD-10-CM | POA: Diagnosis not present

## 2016-04-03 DIAGNOSIS — M25551 Pain in right hip: Secondary | ICD-10-CM | POA: Diagnosis present

## 2016-04-03 NOTE — Telephone Encounter (Signed)
No answer. Left message to call back and said to go to the emergency room to have him checked out if needed.

## 2016-04-03 NOTE — Telephone Encounter (Signed)
New Message:   He wanted you to know that pt fell last night.

## 2016-04-03 NOTE — Telephone Encounter (Signed)
Follow up   Pt verbalized that he is returning call for rn [facility])

## 2016-04-03 NOTE — Telephone Encounter (Signed)
Returned call to patient no answer.LMTC. 
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
## 2016-04-03 NOTE — Telephone Encounter (Signed)
Received a call from patient's son.He stated he wanted to let Dr.Jordan know his father lost his balance and fell last night.Stated he was taken to [facility] ER.He was told he has a small fracture in pelvis.Stated no labs done.Advised he needs to call PCP who follows his INR's and have INR checked.Stated he has appointment with Dr.Jordan on Friday 04/05/16.

## 2016-04-03 NOTE — ED Notes (Signed)
Patient d/c'd to [facility].  F/U reviewed with patient.  Patient verbalized understanding.  Report given to [facility] (caregiver) at facility.

## 2016-04-03 NOTE — ED Notes (Signed)
BIB EMS from [facility]. C/o R hip pain especially upon bearing weight. Pt fell around 1900 tonight. Denies LOC, neck, or head pain. Pedal pulses present.

## 2016-04-03 NOTE — ED Notes (Signed)
Patient transported to X-ray 

## 2016-04-03 NOTE — ED Notes (Signed)
Bed: [id number] Expected date:  Expected time:  Means of arrival: Ambulance Comments: Hip pain

## 2016-04-04 ENCOUNTER — Telehealth: Payer: Self-pay | Admitting: Cardiology

## 2016-04-04 NOTE — Telephone Encounter (Signed)
Joseph Bentley( Son) is calling wanting to know if his father can get his INR checked when he comes in tomorrow for his appt , because he had a fall , as well as wanting to know if Dr.  Martinique can write an order for Physical Therapy   Thanks

## 2016-04-04 NOTE — Telephone Encounter (Signed)
He's usually followed by [facility],  we can check his number and report it back to them.  They can then call patient (or son) with further dosing instructions and return date.  Just add him to any open slot on my schedule.

## 2016-04-05 ENCOUNTER — Encounter: Payer: Self-pay | Admitting: Cardiology

## 2016-04-05 ENCOUNTER — Ambulatory Visit (INDEPENDENT_AMBULATORY_CARE_PROVIDER_SITE_OTHER): Payer: Medicare Other | Admitting: Cardiology

## 2016-04-05 ENCOUNTER — Ambulatory Visit (INDEPENDENT_AMBULATORY_CARE_PROVIDER_SITE_OTHER): Payer: Medicare Other | Admitting: Pharmacist Clinician (PhC)/ Clinical Pharmacy Specialist

## 2016-04-05 ENCOUNTER — Telehealth: Payer: Self-pay | Admitting: Internal Medicine

## 2016-04-05 ENCOUNTER — Institutional Professional Consult (permissible substitution): Payer: Self-pay

## 2016-04-05 ENCOUNTER — Telehealth: Payer: Self-pay

## 2016-04-05 VITALS — BP 105/64 | HR 78 | Ht 66.0 in | Wt 143.0 lb

## 2016-04-05 DIAGNOSIS — I482 Chronic atrial fibrillation, unspecified: Secondary | ICD-10-CM

## 2016-04-05 DIAGNOSIS — Z7901 Long term (current) use of anticoagulants: Secondary | ICD-10-CM

## 2016-04-05 DIAGNOSIS — I25810 Atherosclerosis of coronary artery bypass graft(s) without angina pectoris: Secondary | ICD-10-CM

## 2016-04-05 DIAGNOSIS — I10 Essential (primary) hypertension: Secondary | ICD-10-CM

## 2016-04-05 DIAGNOSIS — S329XXA Fracture of unspecified parts of lumbosacral spine and pelvis, initial encounter for closed fracture: Secondary | ICD-10-CM | POA: Insufficient documentation

## 2016-04-05 DIAGNOSIS — B029 Zoster without complications: Secondary | ICD-10-CM

## 2016-04-05 DIAGNOSIS — S329XXD Fracture of unspecified parts of lumbosacral spine and pelvis, subsequent encounter for fracture with routine healing: Secondary | ICD-10-CM

## 2016-04-05 LAB — POCT INR: INR: 2.9

## 2016-04-05 MED ORDER — ACYCLOVIR 800 MG PO TABS: 800.0000 mg | ORAL_TABLET | Freq: Every day | ORAL | Status: DC

## 2016-04-05 NOTE — Telephone Encounter (Signed)
PLEASE NOTE: All timestamps contained within this report are represented as [place] Standard Time. CONFIDENTIALTY NOTICE: This fax transmission is intended only for the addressee. It contains information that is legally privileged, confidential or otherwise protected from use or disclosure. If you are not the intended recipient, you are strictly prohibited from reviewing, disclosing, copying using or disseminating any of this information or taking any action in reliance on or regarding this information. If you have received this fax in error, please notify us immediately by telephone so that we can arrange for its return to [place]. Phone: [phone], Toll-Free: [phone], Fax: [phone] Page: 1 of 1 Call Id: [id number] [place] [facility] - Client [place] Patient Name: Joseph Bentley DOB: [birth year] Initial Comment Caller states his father fell and broke pelvis, unable to get appt. before Monday, has shingles, may need referral to rehab Nurse Assessment Nurse: Dimas Chyle, RN, Dellis Filbert Date/Time Eilene Ghazi Time): 04/05/2016 10:48:01 AM Confirm and document reason for call. If symptomatic, describe symptoms. You must click the next button to save text entered. ---Caller states his father fell and broke pelvis, unable to get appt. before Monday, has shingles, may need referral to rehab. Fell on 04/02/16 and seen in ED. Back in nursing home. Seen by cardiologist today and treated with anti-virals. Has the patient traveled out of the country within the last 30 days? ---No Does the patient have any new or worsening symptoms? ---No Please document clinical information provided and list any resource used. ---Caller was wanting referral for rehab. Scheduled appointment for caller with Dr. Luther Hearing at 3:00 for 04/05/16. Guidelines Guideline Title Affirmed Question Affirmed Notes Final Disposition User Clinical Call [place], RN, Dellis Filbert

## 2016-04-05 NOTE — Patient Instructions (Addendum)
We will start acyclovir for shingles 800 mg five times a day for 7 days.  Continue your other therapy  I will see you in one year

## 2016-04-05 NOTE — Telephone Encounter (Signed)
Spoke with [employer] [facility] and gave verbal orders for PT and OT.

## 2016-04-05 NOTE — Telephone Encounter (Signed)
Added for coumadin clinic schedule, son informed of plan to check here this AM and send results on to dosing clinic.

## 2016-04-05 NOTE — Telephone Encounter (Signed)
Patient is being addmt. to a nursing home. They need PTOT orders for him. Please follow up.
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
## 2016-04-05 NOTE — Telephone Encounter (Signed)
Please see note below.  Pt has been put on your schedule for you to do a referral.

## 2016-04-07 ENCOUNTER — Other Ambulatory Visit: Payer: Self-pay | Admitting: Cardiology

## 2016-04-08 ENCOUNTER — Inpatient Hospital Stay: Payer: Medicare Other | Admitting: Family

## 2016-04-08 DIAGNOSIS — Z0289 Encounter for other administrative examinations: Secondary | ICD-10-CM

## 2016-04-09 ENCOUNTER — Telehealth: Payer: Self-pay | Admitting: Internal Medicine

## 2016-04-09 NOTE — Telephone Encounter (Signed)
Is requesting order for weight barring for functional transfer of care.  [facility] recently release from [facility].  Sent back with order for non weight baring not allowing patient to be able to transfer to [place] or shower.

## 2016-04-10 ENCOUNTER — Telehealth: Payer: Self-pay | Admitting: Internal Medicine

## 2016-04-10 NOTE — Telephone Encounter (Signed)
Given his pelvic fracture he would need to contact his orthopedic doctor to get that clearance or come in for visit.

## 2016-04-10 NOTE — Telephone Encounter (Signed)
Patient no showed for hospital fu on 4/24 with Greg.  Please advise.

## 2016-04-11 NOTE — Telephone Encounter (Signed)
Left patient vm to call back to reschedule  °

## 2016-04-11 NOTE — Telephone Encounter (Signed)
Call to see if he wants to reschedule.

## 2016-04-12 NOTE — Telephone Encounter (Signed)
Left message for Joseph Bentley informing her that Dr. Sharlet Salina is not able to give orders due to patient's pelvic fracture.

## 2016-04-24 ENCOUNTER — Emergency Department (HOSPITAL_COMMUNITY)
Admission: EM | Admit: 2016-04-24 | Discharge: 2016-04-24 | Disposition: A | Discharge status: Home / Self Care | Payer: Medicare Other | Attending: Emergency Medicine | Admitting: Emergency Medicine

## 2016-04-24 ENCOUNTER — Emergency Department (HOSPITAL_COMMUNITY): Payer: Medicare Other

## 2016-04-24 ENCOUNTER — Encounter (HOSPITAL_COMMUNITY): Payer: Self-pay

## 2016-04-24 DIAGNOSIS — I509 Heart failure, unspecified: Secondary | ICD-10-CM | POA: Diagnosis not present

## 2016-04-24 DIAGNOSIS — Z9889 Other specified postprocedural states: Secondary | ICD-10-CM | POA: Insufficient documentation

## 2016-04-24 DIAGNOSIS — K5900 Constipation, unspecified: Secondary | ICD-10-CM | POA: Diagnosis present

## 2016-04-24 DIAGNOSIS — K219 Gastro-esophageal reflux disease without esophagitis: Secondary | ICD-10-CM | POA: Diagnosis not present

## 2016-04-24 DIAGNOSIS — F329 Major depressive disorder, single episode, unspecified: Secondary | ICD-10-CM | POA: Diagnosis not present

## 2016-04-24 DIAGNOSIS — Z8639 Personal history of other endocrine, nutritional and metabolic disease: Secondary | ICD-10-CM | POA: Diagnosis not present

## 2016-04-24 DIAGNOSIS — Z87891 Personal history of nicotine dependence: Secondary | ICD-10-CM | POA: Diagnosis not present

## 2016-04-24 DIAGNOSIS — Z8673 Personal history of transient ischemic attack (TIA), and cerebral infarction without residual deficits: Secondary | ICD-10-CM | POA: Diagnosis not present

## 2016-04-24 DIAGNOSIS — I25119 Atherosclerotic heart disease of native coronary artery with unspecified angina pectoris: Secondary | ICD-10-CM | POA: Diagnosis not present

## 2016-04-24 DIAGNOSIS — Z79899 Other long term (current) drug therapy: Secondary | ICD-10-CM | POA: Insufficient documentation

## 2016-04-24 DIAGNOSIS — Z9181 History of falling: Secondary | ICD-10-CM | POA: Insufficient documentation

## 2016-04-24 DIAGNOSIS — Z951 Presence of aortocoronary bypass graft: Secondary | ICD-10-CM | POA: Diagnosis not present

## 2016-04-24 DIAGNOSIS — G40909 Epilepsy, unspecified, not intractable, without status epilepticus: Secondary | ICD-10-CM | POA: Insufficient documentation

## 2016-04-24 DIAGNOSIS — Z7901 Long term (current) use of anticoagulants: Secondary | ICD-10-CM | POA: Insufficient documentation

## 2016-04-24 DIAGNOSIS — Z87438 Personal history of other diseases of male genital organs: Secondary | ICD-10-CM | POA: Diagnosis not present

## 2016-04-24 DIAGNOSIS — I10 Essential (primary) hypertension: Secondary | ICD-10-CM | POA: Insufficient documentation

## 2016-04-24 DIAGNOSIS — H9190 Unspecified hearing loss, unspecified ear: Secondary | ICD-10-CM | POA: Diagnosis not present

## 2016-04-24 MED ORDER — POLYETHYLENE GLYCOL 3350 17 G PO PACK: 17.0000 g | PACK | Freq: Two times a day (BID) | ORAL | Status: DC

## 2016-04-24 MED ORDER — POLYETHYLENE GLYCOL 3350 17 G PO PACK
34.0000 g | PACK | Freq: Once | ORAL | Status: AC
Start: 2016-04-24 — End: 2016-04-24
  Administered 2016-04-24: 34 g via ORAL
  Filled 2016-04-24: qty 2

## 2016-04-24 NOTE — ED Notes (Signed)
PT RECEIVED FROM [facility] VIA EMS FOR CONSTIPATION X1 WEEK. PT DENIES ABDOMINAL PAIN, N/V.
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
## 2016-04-24 NOTE — ED Notes (Signed)
Bed: [facility] Expected date:  Expected time:  Means of arrival:  Comments: 12 to wait for PTAR here

## 2016-04-24 NOTE — ED Notes (Signed)
Bed: [id number] Expected date:  Expected time:  Means of arrival:  Comments: EMS - constipation - 12

## 2016-04-24 NOTE — Discharge Instructions (Signed)
Your constipation is likely due to the fact that you missed your colace. You should try taking miralax TWICE daily until you achieve daily soft stools-- you can use more than 2 doses daily in order to achieve the first bowel movement and then cut back to 1-2 times daily to continue having the soft daily stools. Continue taking your colace. Increase the fiber and water intake in your diet. See the information below regarding improving your bowel health. Follow up with your regular doctor in 1 week for recheck of symptoms. Return to the ER for changes or worsening symptoms.   GETTING TO GOOD BOWEL HEALTH. Irregular bowel habits such as constipation and diarrhea can lead to many problems over time.  Having one soft bowel movement a day is the most important way to prevent further problems.  The anorectal canal is designed to handle stretching and feces to safely manage our ability to get rid of solid waste (feces, poop, stool) out of our body.  BUT, hard constipated stools can act like ripping concrete bricks and diarrhea can be a burning fire to this very sensitive area of our body, causing inflamed hemorrhoids, anal fissures, increasing risk is perirectal abscesses, abdominal pain/bloating, an making irritable bowel worse.     The goal: ONE SOFT BOWEL MOVEMENT A DAY!  To have soft, regular bowel movements:   Drink at least 8 tall glasses of water a day.    Take plenty of fiber.  Fiber is the undigested part of plant food that passes into the colon, acting s natures broom to encourage bowel motility and movement.  Fiber can absorb and hold large amounts of water. This results in a larger, bulkier stool, which is soft and easier to pass. Work gradually over several weeks up to 6 servings a day of fiber (25g a day even more if needed) in the form of: o Vegetables -- Root (potatoes, carrots, turnips), leafy green (lettuce, salad greens, celery, spinach), or cooked high residue (cabbage, broccoli, etc) o Fruit  -- Fresh (unpeeled skin & pulp), Dried (prunes, apricots, cherries, etc ),  or stewed ( applesauce)  o Whole grain breads, pasta, etc (whole wheat)  o Bran cereals   Bulking Agents -- This type of water-retaining fiber generally is easily obtained each day by one of the following:  o Psyllium bran -- The psyllium plant is remarkable because its ground seeds can retain so much water. This product is available as Metamucil, Konsyl, Effersyllium, Per Diem Fiber, or the less expensive generic preparation in drug and health food stores. Although labeled a laxative, it really is not a laxative.  o Methylcellulose -- This is another fiber derived from wood which also retains water. It is available as Citrucel. o Polyethylene Glycol - and artificial fiber commonly called Miralax or Glycolax.  It is helpful for people with gassy or bloated feelings with regular fiber o Flax Seed - a less gassy fiber than psyllium  No reading or other relaxing activity while on the toilet. If bowel movements take longer than 5 minutes, you are too constipated  AVOID CONSTIPATION.  High fiber and water intake usually takes care of this.  Sometimes a laxative is needed to stimulate more frequent bowel movements, but   Laxatives are not a good long-term solution as it can wear the colon out. o Osmotics (Milk of Magnesia, Fleets phosphosoda, Magnesium citrate, MiraLax, GoLytely) are safer than  o Stimulants (Senokot, Castor Oil, Dulcolax, Ex Lax)    o Do not  take laxatives for more than 7days in a row.   IF SEVERELY CONSTIPATED, try a Bowel Retraining Program: o Do not use laxatives.  o Eat a diet high in roughage, such as bran cereals and leafy vegetables.  o Drink six (6) ounces of prune or apricot juice each morning.  o Eat two (2) large servings of stewed fruit each day.  o Take one (1) heaping tablespoon of a psyllium-based bulking agent twice a day. Use sugar-free sweetener when possible to avoid excessive  calories.  o Eat a normal breakfast.  o Set aside 15 minutes after breakfast to sit on the toilet, but do not strain to have a bowel movement.  o If you do not have a bowel movement by the third day, use an enema and repeat the above steps.   Controlling diarrhea o Switch to liquids and simpler foods for a few days to avoid stressing your intestines further. o Avoid dairy products (especially milk & ice cream) for a short time.  The intestines often can lose the ability to digest lactose when stressed. o Avoid foods that cause gassiness or bloating.  Typical foods include beans and other legumes, cabbage, broccoli, and dairy foods.  Every person has some sensitivity to other foods, so listen to our body and avoid those foods that trigger problems for you. o Adding fiber (Citrucel, Metamucil, psyllium, Miralax) gradually can help thicken stools by absorbing excess fluid and retrain the intestines to act more normally.  Slowly increase the dose over a few weeks.  Too much fiber too soon can backfire and cause cramping & bloating. o Probiotics (such as active yogurt, Align, etc) may help repopulate the intestines and colon with normal bacteria and calm down a sensitive digestive tract.  Most studies show it to be of mild help, though, and such products can be costly. o Medicines: - Bismuth subsalicylate (ex. Kayopectate, Pepto Bismol) every 30 minutes for up to 6 doses can help control diarrhea.  Avoid if pregnant. - Loperamide (Immodium) can slow down diarrhea.  Start with two tablets (4mg  total) first and then try one tablet every 6 hours.  Avoid if you are having fevers or severe pain.  If you are not better or start feeling worse, stop all medicines and call your doctor for advice o Call your doctor if you are getting worse or not better.  Sometimes further testing (cultures, endoscopy, X-ray studies, bloodwork, etc) may be needed to help diagnose and treat the cause of the diarrhea.  Managing  Pain  Pain after surgery or related to activity is often due to strain/injury to muscle, tendon, nerves and/or incisions.  This pain is usually short-term and will improve in a few months.   Many people find it helpful to do the following things TOGETHER to help speed the process of healing and to get back to regular activity more quickly:  1. Avoid heavy physical activity a.  no lifting greater than 20 pounds b. Do not push through the pain.  Listen to your body and avoid positions and maneuvers than reproduce the pain c. Walking is okay as tolerated, but go slowly and stop when getting sore.  d. Remember: If it hurts to do it, then dont do it! 2. Take Anti-inflammatory medication  a. Take with food/snack around the clock for 1-2 weeks i. This helps the muscle and nerve tissues become less irritable and calm down faster b. Choose ONE of the following over-the-counter medications: i. Naproxen 220mg  tabs (ex.  Aleve) 1-2 pills twice a day  ii. Ibuprofen 200mg  tabs (ex. Advil, Motrin) 3-4 pills with every meal and just before bedtime iii. Acetaminophen 500mg  tabs (Tylenol) 1-2 pills with every meal and just before bedtime 3. Use a Heating pad or Ice/Cold Pack a. 4-6 times a day b. May use warm bath/hottub  or showers 4. Try Gentle Massage and/or Stretching  a. at the area of pain many times a day b. stop if you feel pain - do not overdo it  Try these steps together to help you body heal faster and avoid making things get worse.  Doing just one of these things may not be enough.    If you are not getting better after two weeks or are noticing you are getting worse, contact our office for further advice; we may need to re-evaluate you & see what other things we can do to help.   Constipation, Adult Constipation is when a person has fewer than three bowel movements a week, has difficulty having a bowel movement, or has stools that are dry, hard, or larger than normal. As people grow  older, constipation is more common. A low-fiber diet, not taking in enough fluids, and taking certain medicines may make constipation worse.  CAUSES   Certain medicines, such as antidepressants, pain medicine, iron supplements, antacids, and water pills.   Certain diseases, such as diabetes, irritable bowel syndrome (IBS), thyroid disease, or depression.   Not drinking enough water.   Not eating enough fiber-rich foods.   Stress or travel.   Lack of physical activity or exercise.   Ignoring the urge to have a bowel movement.   Using laxatives too much.  SIGNS AND SYMPTOMS   Having fewer than three bowel movements a week.   Straining to have a bowel movement.   Having stools that are hard, dry, or larger than normal.   Feeling full or bloated.   Pain in the lower abdomen.   Not feeling relief after having a bowel movement.  DIAGNOSIS  Your health care provider will take a medical history and perform a physical exam. Further testing may be done for severe constipation. Some tests may include:  A barium enema X-ray to examine your rectum, colon, and, sometimes, your small intestine.   A sigmoidoscopy to examine your lower colon.   A colonoscopy to examine your entire colon. TREATMENT  Treatment will depend on the severity of your constipation and what is causing it. Some dietary treatments include drinking more fluids and eating more fiber-rich foods. Lifestyle treatments may include regular exercise. If these diet and lifestyle recommendations do not help, your health care provider may recommend taking over-the-counter laxative medicines to help you have bowel movements. Prescription medicines may be prescribed if over-the-counter medicines do not work.  HOME CARE INSTRUCTIONS   Eat foods that have a lot of fiber, such as fruits, vegetables, whole grains, and beans.  Limit foods high in fat and processed sugars, such as french fries, hamburgers, cookies,  candies, and soda.   A fiber supplement may be added to your diet if you cannot get enough fiber from foods.   Drink enough fluids to keep your urine clear or pale yellow.   Exercise regularly or as directed by your health care provider.   Go to the restroom when you have the urge to go. Do not hold it.   Only take over-the-counter or prescription medicines as directed by your health care provider. Do not take other medicines for  constipation without talking to your health care provider first.  [place] IF:   You have bright red blood in your stool.   Your constipation lasts for more than 4 days or gets worse.   You have abdominal or rectal pain.   You have thin, pencil-like stools.   You have unexplained weight loss. MAKE SURE YOU:   Understand these instructions.  Will watch your condition.  Will get help right away if you are not doing well or get worse.   This information is not intended to replace advice given to you by your health care provider. Make sure you discuss any questions you have with your health care provider.   Document Released: 08/30/2004 Document Revised: 12/23/2014 Document Reviewed: 09/13/2013 Elsevier Interactive Patient Education 2016 Elsevier Inc.  High-Fiber Diet Fiber, also called dietary fiber, is a type of carbohydrate found in fruits, vegetables, whole grains, and beans. A high-fiber diet can have many health benefits. Your health care provider may recommend a high-fiber diet to help:  Prevent constipation. Fiber can make your bowel movements more regular.  Lower your cholesterol.  Relieve hemorrhoids, uncomplicated diverticulosis, or irritable bowel syndrome.  Prevent overeating as part of a weight-loss plan.  Prevent heart disease, type 2 diabetes, and certain cancers. WHAT IS MY PLAN? The recommended daily intake of fiber includes:  38 grams for men under age 68.  42 grams for men over age 42.  49  grams for women under age 63.  71 grams for women over age 65. You can get the recommended daily intake of dietary fiber by eating a variety of fruits, vegetables, grains, and beans. Your health care provider may also recommend a fiber supplement if it is not possible to get enough fiber through your diet. WHAT DO I NEED TO KNOW ABOUT A HIGH-FIBER DIET?  Fiber supplements have not been widely studied for their effectiveness, so it is better to get fiber through food sources.  Always check the fiber content on thenutrition facts label of any prepackaged food. Look for foods that contain at least 5 grams of fiber per serving.  Ask your dietitian if you have questions about specific foods that are related to your condition, especially if those foods are not listed in the following section.  Increase your daily fiber consumption gradually. Increasing your intake of dietary fiber too quickly may cause bloating, cramping, or gas.  Drink plenty of water. Water helps you to digest fiber. WHAT FOODS CAN I EAT? Grains Whole-grain breads. Multigrain cereal. Oats and oatmeal. Brown rice. Barley. Bulgur wheat. [place]. Bran muffins. Popcorn. Rye wafer crackers. Vegetables Sweet potatoes. Spinach. Kale. Artichokes. Cabbage. Broccoli. Green peas. Carrots. Squash. Fruits Berries. Pears. Apples. Oranges. Avocados. Prunes and raisins. Dried figs. Meats and Other Protein Sources [place], kidney, pinto, and soy beans. Split peas. Lentils. Nuts and seeds. Dairy Fiber-fortified yogurt. Beverages Fiber-fortified soy milk. Fiber-fortified orange juice. Other Fiber bars. The items listed above may not be a complete list of recommended foods or beverages. Contact your dietitian for more options. WHAT FOODS ARE NOT RECOMMENDED? Grains White bread. Pasta made with refined flour. White rice. Vegetables Fried potatoes. Canned vegetables. Well-cooked vegetables.  Fruits Fruit juice. Cooked, strained fruit. Meats  and Other Protein Sources Fatty cuts of meat. Fried Sales executive or fried fish. Dairy Milk. Yogurt. Cream cheese. Sour cream. Beverages Soft drinks. Other Cakes and pastries. Butter and oils. The items listed above may not be a complete list of foods and beverages to avoid.  Contact your dietitian for more information. WHAT ARE SOME TIPS FOR INCLUDING HIGH-FIBER FOODS IN MY DIET?  Eat a wide variety of high-fiber foods.  Make sure that half of all grains consumed each day are whole grains.  Replace breads and cereals made from refined flour or white flour with whole-grain breads and cereals.  Replace white rice with brown rice, bulgur wheat, or millet.  Start the day with a breakfast that is high in fiber, such as a cereal that contains at least 5 grams of fiber per serving.  Use beans in place of meat in soups, salads, or pasta.  Eat high-fiber snacks, such as berries, raw vegetables, nuts, or popcorn.   This information is not intended to replace advice given to you by your health care provider. Make sure you discuss any questions you have with your health care provider.   Document Released: 12/02/2005 Document Revised: 12/23/2014 Document Reviewed: 05/17/2014 Elsevier Interactive Patient Education [employer].

## 2016-05-01 ENCOUNTER — Other Ambulatory Visit: Payer: Self-pay | Admitting: Internal Medicine

## 2016-05-02 ENCOUNTER — Other Ambulatory Visit: Payer: Self-pay | Admitting: General Practice

## 2016-05-02 MED ORDER — WARFARIN SODIUM 5 MG PO TABS: ORAL_TABLET | ORAL | Status: DC

## 2016-06-07 ENCOUNTER — Other Ambulatory Visit: Payer: Self-pay | Admitting: Cardiology

## 2016-06-10 NOTE — Telephone Encounter (Signed)
Rx request sent to pharmacy.  

## 2016-08-06 ENCOUNTER — Other Ambulatory Visit: Payer: Self-pay | Admitting: Neurology

## 2016-09-05 ENCOUNTER — Other Ambulatory Visit: Payer: Self-pay | Admitting: Internal Medicine

## 2016-09-06 ENCOUNTER — Other Ambulatory Visit: Payer: Self-pay | Admitting: General Practice

## 2016-09-06 MED ORDER — WARFARIN SODIUM 5 MG PO TABS: ORAL_TABLET | ORAL | 0 refills | Status: DC

## 2016-09-19 ENCOUNTER — Ambulatory Visit: Payer: Medicare Other | Admitting: Nurse Practitioner

## 2016-09-20 ENCOUNTER — Ambulatory Visit (INDEPENDENT_AMBULATORY_CARE_PROVIDER_SITE_OTHER): Payer: Medicare Other | Admitting: Nurse Practitioner

## 2016-09-20 ENCOUNTER — Encounter: Payer: Self-pay | Admitting: Nurse Practitioner

## 2016-09-20 VITALS — BP 155/82 | HR 81 | Ht 66.0 in | Wt 146.6 lb

## 2016-09-20 DIAGNOSIS — R569 Unspecified convulsions: Secondary | ICD-10-CM

## 2016-09-20 DIAGNOSIS — I25810 Atherosclerosis of coronary artery bypass graft(s) without angina pectoris: Secondary | ICD-10-CM

## 2016-09-20 DIAGNOSIS — R2681 Unsteadiness on feet: Secondary | ICD-10-CM

## 2016-09-20 MED ORDER — LEVETIRACETAM 750 MG PO TABS: 750.0000 mg | ORAL_TABLET | Freq: Two times a day (BID) | ORAL | 3 refills | Status: DC

## 2016-09-20 NOTE — Patient Instructions (Signed)
Continue Keppra at current dose, will refill for 1 year Call for any seizure activity Use walker at all times for safe ambulation F/U yearly

## 2016-09-20 NOTE — Progress Notes (Signed)
I have read the note, and I agree with the clinical assessment and plan.  Dayn Barich KEITH   
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
## 2016-10-05 ENCOUNTER — Other Ambulatory Visit: Payer: Self-pay | Admitting: Nurse Practitioner

## 2016-10-07 ENCOUNTER — Other Ambulatory Visit: Payer: Self-pay | Admitting: Internal Medicine

## 2016-10-08 ENCOUNTER — Other Ambulatory Visit: Payer: Self-pay | Admitting: General Practice

## 2016-10-08 MED ORDER — WARFARIN SODIUM 5 MG PO TABS: ORAL_TABLET | ORAL | 0 refills | Status: DC

## 2016-11-11 ENCOUNTER — Ambulatory Visit: Payer: Self-pay | Admitting: General Practice

## 2016-12-09 ENCOUNTER — Emergency Department (HOSPITAL_COMMUNITY)
Admission: EM | Admit: 2016-12-09 | Discharge: 2016-12-09 | Disposition: A | Discharge status: Home / Self Care | Payer: Medicare Other | Attending: Emergency Medicine | Admitting: Emergency Medicine

## 2016-12-09 ENCOUNTER — Emergency Department (HOSPITAL_BASED_OUTPATIENT_CLINIC_OR_DEPARTMENT_OTHER)
Admit: 2016-12-09 | Discharge: 2016-12-09 | Disposition: A | Discharge status: Home / Self Care | Payer: Medicare Other | Attending: Emergency Medicine | Admitting: Emergency Medicine

## 2016-12-09 ENCOUNTER — Encounter (HOSPITAL_COMMUNITY): Payer: Self-pay | Admitting: Emergency Medicine

## 2016-12-09 ENCOUNTER — Emergency Department (HOSPITAL_COMMUNITY): Payer: Medicare Other

## 2016-12-09 DIAGNOSIS — Z87891 Personal history of nicotine dependence: Secondary | ICD-10-CM | POA: Insufficient documentation

## 2016-12-09 DIAGNOSIS — I110 Hypertensive heart disease with heart failure: Secondary | ICD-10-CM | POA: Insufficient documentation

## 2016-12-09 DIAGNOSIS — S80212A Abrasion, left knee, initial encounter: Secondary | ICD-10-CM | POA: Insufficient documentation

## 2016-12-09 DIAGNOSIS — M25561 Pain in right knee: Secondary | ICD-10-CM

## 2016-12-09 DIAGNOSIS — Y939 Activity, unspecified: Secondary | ICD-10-CM | POA: Diagnosis not present

## 2016-12-09 DIAGNOSIS — Z8673 Personal history of transient ischemic attack (TIA), and cerebral infarction without residual deficits: Secondary | ICD-10-CM | POA: Diagnosis not present

## 2016-12-09 DIAGNOSIS — W19XXXA Unspecified fall, initial encounter: Secondary | ICD-10-CM

## 2016-12-09 DIAGNOSIS — Z7901 Long term (current) use of anticoagulants: Secondary | ICD-10-CM | POA: Insufficient documentation

## 2016-12-09 DIAGNOSIS — W010XXA Fall on same level from slipping, tripping and stumbling without subsequent striking against object, initial encounter: Secondary | ICD-10-CM | POA: Diagnosis not present

## 2016-12-09 DIAGNOSIS — M79609 Pain in unspecified limb: Secondary | ICD-10-CM

## 2016-12-09 DIAGNOSIS — Y999 Unspecified external cause status: Secondary | ICD-10-CM | POA: Insufficient documentation

## 2016-12-09 DIAGNOSIS — I2510 Atherosclerotic heart disease of native coronary artery without angina pectoris: Secondary | ICD-10-CM | POA: Diagnosis not present

## 2016-12-09 DIAGNOSIS — Z951 Presence of aortocoronary bypass graft: Secondary | ICD-10-CM | POA: Diagnosis not present

## 2016-12-09 DIAGNOSIS — S80211A Abrasion, right knee, initial encounter: Secondary | ICD-10-CM | POA: Insufficient documentation

## 2016-12-09 DIAGNOSIS — S8991XA Unspecified injury of right lower leg, initial encounter: Secondary | ICD-10-CM | POA: Diagnosis present

## 2016-12-09 DIAGNOSIS — Y92009 Unspecified place in unspecified non-institutional (private) residence as the place of occurrence of the external cause: Secondary | ICD-10-CM

## 2016-12-09 DIAGNOSIS — T148XXA Other injury of unspecified body region, initial encounter: Secondary | ICD-10-CM

## 2016-12-09 DIAGNOSIS — I509 Heart failure, unspecified: Secondary | ICD-10-CM | POA: Insufficient documentation

## 2016-12-09 LAB — PROTIME-INR
INR: 3.25
PROTHROMBIN TIME: 33.9 s — AB (ref 11.4–15.2)

## 2016-12-09 MED ORDER — ACETAMINOPHEN 325 MG PO TABS
650.0000 mg | ORAL_TABLET | Freq: Once | ORAL | Status: AC
  Administered 2016-12-09: 650 mg via ORAL
  Filled 2016-12-09: qty 2

## 2016-12-09 NOTE — Discharge Instructions (Signed)
Take over the counter tylenol, as directed on packaging, as needed for discomfort. Wear the ace wrap for comfort. Use your walker or wheelchair for comfort until you are seen in follow up. Apply moist heat or ice to the area(s) of discomfort, for 15 minutes at a time, several times per day for the next few days.  Do not fall asleep on a heating or ice pack.  Call your regular medical doctor tomorrow to schedule a follow up appointment in the next 2 days. Call the Orthopedic doctor tomorrow to schedule a follow up appointment this week.  Return to the Emergency Department immediately if worsening.

## 2016-12-09 NOTE — ED Notes (Signed)
Bed: [facility] Expected date:  Expected time:  Means of arrival:  Comments: 

## 2016-12-09 NOTE — ED Notes (Signed)
[facility] called for transport.  

## 2016-12-09 NOTE — ED Notes (Signed)
US at bedside

## 2016-12-09 NOTE — ED Triage Notes (Addendum)
Per EMS pt from [facility] independent living facility with fall trying to get newspaper yesterday. Pt unable to bear weight to right knee. Bilateral abrasions to knees.

## 2016-12-09 NOTE — ED Notes (Signed)
Bed: WA23 Expected date:  Expected time:  Means of arrival:  Comments: EMS 
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
## 2016-12-09 NOTE — Progress Notes (Signed)
*  PRELIMINARY RESULTS* Vascular Ultrasound Right lower extremity venous duplex has been completed.  Preliminary findings: No evidence of DVT or baker's cyst.  Landry Mellow, RDMS, RVT  12/09/2016, 12:40 PM

## 2017-03-08 ENCOUNTER — Other Ambulatory Visit: Payer: Self-pay | Admitting: Cardiology

## 2017-04-04 ENCOUNTER — Other Ambulatory Visit: Payer: Self-pay | Admitting: Cardiology

## 2017-04-04 NOTE — Telephone Encounter (Signed)
New message      *STAT* If patient is at the pharmacy, call can be transferred to refill team.   1. Which medications need to be refilled? (please list name of each medication and dose if known)  hydrALAZINE (APRESOLINE) 100 MG tablet TAKE 1 TABLET BY MOUTH 3 TIMES A DAY     2. Which pharmacy/location (including street and city if local pharmacy) [facility]  3. Do they need a 30 day or 90 day supply? 37   Pt is leaving out of town , he is aware he needs a follow up appt

## 2017-05-02 ENCOUNTER — Other Ambulatory Visit: Payer: Self-pay | Admitting: Cardiology

## 2017-05-02 ENCOUNTER — Other Ambulatory Visit: Payer: Self-pay | Admitting: *Deleted

## 2017-05-02 NOTE — Telephone Encounter (Signed)
New message    Pt son is calling, pt is out of medication. *STAT* If patient is at the pharmacy, call can be transferred to refill team.   1. Which medications need to be refilled? (please list name of each medication and dose if known) hydralazine 100 mg  2. Which pharmacy/location (including street and city if local pharmacy) is [facility]  3. Do they need a 30 day or 90 day supply? 30 day

## 2017-05-29 ENCOUNTER — Encounter: Payer: Self-pay | Admitting: Neurology

## 2017-05-29 ENCOUNTER — Ambulatory Visit (INDEPENDENT_AMBULATORY_CARE_PROVIDER_SITE_OTHER): Payer: Medicare Other | Admitting: Neurology

## 2017-05-29 VITALS — BP 118/70 | HR 50 | Ht 66.0 in

## 2017-05-29 DIAGNOSIS — R569 Unspecified convulsions: Secondary | ICD-10-CM

## 2017-05-29 DIAGNOSIS — R2681 Unsteadiness on feet: Secondary | ICD-10-CM

## 2017-05-29 DIAGNOSIS — E538 Deficiency of other specified B group vitamins: Secondary | ICD-10-CM
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
## 2017-05-30 ENCOUNTER — Telehealth: Payer: Self-pay | Admitting: *Deleted

## 2017-05-30 LAB — VITAMIN B12: VITAMIN B 12: 254 pg/mL (ref 232–1245)

## 2017-05-30 LAB — SEDIMENTATION RATE: Sed Rate: 13 mm/hr (ref 0–30)

## 2017-05-30 NOTE — Telephone Encounter (Signed)
-----   Message from Kathrynn Ducking, MD sent at 05/30/2017  7:21 AM EDT -----  The blood work results are unremarkable. Please call the patient.  ----- Message ----- From: Lavone Neri Lab Results In Sent: 05/30/2017   5:40 AM To: Kathrynn Ducking, MD

## 2017-05-30 NOTE — Telephone Encounter (Signed)
Called and LVM for son about unremarkable labs per CW,MD note. Gave [facility] phone number if he has further questions or concerns.

## 2017-06-06 ENCOUNTER — Inpatient Hospital Stay: Admission: RE | Admit: 2017-06-06 | Payer: Medicare Other | Source: Ambulatory Visit

## 2017-06-06 ENCOUNTER — Ambulatory Visit
Admission: RE | Admit: 2017-06-06 | Discharge: 2017-06-06 | Disposition: A | Discharge status: Home / Self Care | Payer: Medicare Other | Source: Ambulatory Visit | Attending: Neurology | Admitting: Neurology

## 2017-06-06 ENCOUNTER — Telehealth: Payer: Self-pay | Admitting: Neurology

## 2017-06-06 DIAGNOSIS — R2681 Unsteadiness on feet: Secondary | ICD-10-CM

## 2017-06-06 NOTE — Telephone Encounter (Signed)
I called patient. CT the brain is unremarkable, nothing that would be causing a change in walking or balance.  CT brain 06/06/17:  IMPRESSION: Atrophy and chronic ischemia. No acute abnormality.

## 2017-06-09 NOTE — Telephone Encounter (Signed)
The patient had a significant change in his ability to and leg following a fall in December 2017. He has not responded to physical therapy. CT the head does not show an etiology for the changing gait.  We could look at the neck and low back, but I am not sure that he would be a candidate for surgery to found an issue such as spinal stenosis.

## 2017-06-09 NOTE — Telephone Encounter (Signed)
Patients son Clair Gulling (listed on DPR) called office in reference to CT scan being normal, and he would like to know what the next steps are.  Please call

## 2017-06-09 NOTE — Telephone Encounter (Signed)
Dr Willis- please advise 

## 2017-06-12 ENCOUNTER — Other Ambulatory Visit: Payer: Self-pay | Admitting: Cardiology

## 2017-06-27 ENCOUNTER — Other Ambulatory Visit: Payer: Self-pay | Admitting: Physician Assistant

## 2017-06-28 ENCOUNTER — Other Ambulatory Visit: Payer: Self-pay | Admitting: Cardiology

## 2017-06-30 ENCOUNTER — Telehealth: Payer: Self-pay | Admitting: Cardiology

## 2017-06-30 MED ORDER — HYDRALAZINE HCL 100 MG PO TABS: 100.0000 mg | ORAL_TABLET | Freq: Three times a day (TID) | ORAL | 0 refills | Status: DC

## 2017-06-30 NOTE — Telephone Encounter (Signed)
New message       *STAT* If patient is at the pharmacy, call can be transferred to refill team.   1. Which medications need to be refilled? (please list name of each medication and dose if known)  Hydralazine 100mg  2. Which pharmacy/location (including street and city if local pharmacy) is medication to be sent to[facility]  3. Do they need a 30 day or 90 day supply? 90day

## 2017-06-30 NOTE — Telephone Encounter (Signed)
rx sent to pharmacy

## 2017-08-19 ENCOUNTER — Other Ambulatory Visit: Payer: Self-pay | Admitting: Cardiology

## 2017-08-19 NOTE — Telephone Encounter (Signed)
REFILL 

## 2017-09-19 ENCOUNTER — Other Ambulatory Visit: Payer: Self-pay | Admitting: Cardiology

## 2017-09-19 ENCOUNTER — Ambulatory Visit: Payer: Medicare Other | Admitting: Neurology

## 2017-09-22 ENCOUNTER — Other Ambulatory Visit: Payer: Self-pay | Admitting: Nurse Practitioner

## 2017-10-06 ENCOUNTER — Other Ambulatory Visit: Payer: Self-pay | Admitting: *Deleted

## 2017-10-06 MED ORDER — HYDRALAZINE HCL 100 MG PO TABS: ORAL_TABLET | ORAL | 0 refills | Status: DC

## 2017-10-10 ENCOUNTER — Encounter: Payer: Self-pay | Admitting: Cardiology

## 2017-10-10 ENCOUNTER — Ambulatory Visit (INDEPENDENT_AMBULATORY_CARE_PROVIDER_SITE_OTHER): Payer: Medicare Other | Admitting: Cardiology

## 2017-10-10 VITALS — BP 146/58 | HR 53 | Ht 70.0 in | Wt 142.8 lb

## 2017-10-10 DIAGNOSIS — I482 Chronic atrial fibrillation, unspecified: Secondary | ICD-10-CM

## 2017-10-10 DIAGNOSIS — I10 Essential (primary) hypertension: Secondary | ICD-10-CM

## 2017-10-10 DIAGNOSIS — I25810 Atherosclerosis of coronary artery bypass graft(s) without angina pectoris: Secondary | ICD-10-CM | POA: Diagnosis not present

## 2017-10-10 MED ORDER — LABETALOL HCL 200 MG PO TABS: 200.0000 mg | ORAL_TABLET | Freq: Two times a day (BID) | ORAL | 11 refills | Status: DC

## 2017-10-10 NOTE — Patient Instructions (Signed)
Reduce labetalol to 200 mg twice a day  Continue your other therapy  I will see you in 6 months.
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
## 2017-10-23 ENCOUNTER — Other Ambulatory Visit: Payer: Self-pay | Admitting: Cardiology

## 2017-11-10 ENCOUNTER — Ambulatory Visit: Payer: Medicare Other | Admitting: Cardiology

## 2017-11-10 ENCOUNTER — Other Ambulatory Visit: Payer: Self-pay | Admitting: Cardiology

## 2017-11-11 NOTE — Telephone Encounter (Signed)
REFILL 

## 2018-02-06 ENCOUNTER — Telehealth: Payer: Self-pay | Admitting: Cardiology

## 2018-02-06 ENCOUNTER — Other Ambulatory Visit: Payer: Self-pay | Admitting: *Deleted

## 2018-02-06 DIAGNOSIS — I482 Chronic atrial fibrillation, unspecified: Secondary | ICD-10-CM

## 2018-02-06 MED ORDER — HYDRALAZINE HCL 100 MG PO TABS: 100.0000 mg | ORAL_TABLET | Freq: Three times a day (TID) | ORAL | 5 refills | Status: DC

## 2018-02-06 MED ORDER — LABETALOL HCL 200 MG PO TABS: 200.0000 mg | ORAL_TABLET | Freq: Two times a day (BID) | ORAL | 11 refills | Status: DC

## 2018-02-06 MED ORDER — HYDRALAZINE HCL 100 MG PO TABS: 100.0000 mg | ORAL_TABLET | Freq: Three times a day (TID) | ORAL | 1 refills | Status: AC

## 2018-02-06 NOTE — Telephone Encounter (Signed)
New message     *STAT* If patient is at the pharmacy, call can be transferred to refill team.   1. Which medications need to be refilled? (please list name of each medication and dose if known) hydrALAZINE (APRESOLINE) 100 MG tablet and abetalol (NORMODYNE) 200 MG tablet 2. Which pharmacy/location (including street and city if local pharmacy) is medication to be sent to?[id number] - [place], [place] - [address]  3. Do they need a 30 day or 90 day supply? [place]

## 2018-02-19 ENCOUNTER — Emergency Department (HOSPITAL_COMMUNITY)
Admission: EM | Admit: 2018-02-19 | Discharge: 2018-02-20 | Disposition: A | Discharge status: Skilled Nursing Facility | Payer: Medicare Other | Attending: Emergency Medicine | Admitting: Emergency Medicine

## 2018-02-19 ENCOUNTER — Emergency Department (HOSPITAL_COMMUNITY): Payer: Medicare Other

## 2018-02-19 ENCOUNTER — Other Ambulatory Visit: Payer: Self-pay

## 2018-02-19 ENCOUNTER — Encounter (HOSPITAL_COMMUNITY): Payer: Self-pay | Admitting: *Deleted

## 2018-02-19 DIAGNOSIS — Y939 Activity, unspecified: Secondary | ICD-10-CM | POA: Insufficient documentation

## 2018-02-19 DIAGNOSIS — Z87891 Personal history of nicotine dependence: Secondary | ICD-10-CM | POA: Insufficient documentation

## 2018-02-19 DIAGNOSIS — Z79899 Other long term (current) drug therapy: Secondary | ICD-10-CM | POA: Insufficient documentation

## 2018-02-19 DIAGNOSIS — I509 Heart failure, unspecified: Secondary | ICD-10-CM | POA: Diagnosis not present

## 2018-02-19 DIAGNOSIS — S0990XA Unspecified injury of head, initial encounter: Secondary | ICD-10-CM | POA: Insufficient documentation

## 2018-02-19 DIAGNOSIS — Z043 Encounter for examination and observation following other accident: Secondary | ICD-10-CM | POA: Diagnosis present

## 2018-02-19 DIAGNOSIS — Y92122 Bedroom in nursing home as the place of occurrence of the external cause: Secondary | ICD-10-CM | POA: Insufficient documentation

## 2018-02-19 DIAGNOSIS — W19XXXA Unspecified fall, initial encounter: Secondary | ICD-10-CM

## 2018-02-19 DIAGNOSIS — I10 Essential (primary) hypertension: Secondary | ICD-10-CM | POA: Insufficient documentation

## 2018-02-19 DIAGNOSIS — M6281 Muscle weakness (generalized): Secondary | ICD-10-CM | POA: Insufficient documentation

## 2018-02-19 DIAGNOSIS — W1830XA Fall on same level, unspecified, initial encounter: Secondary | ICD-10-CM | POA: Diagnosis not present

## 2018-02-19 DIAGNOSIS — Y999 Unspecified external cause status: Secondary | ICD-10-CM | POA: Diagnosis not present

## 2018-02-19 DIAGNOSIS — I2510 Atherosclerotic heart disease of native coronary artery without angina pectoris: Secondary | ICD-10-CM | POA: Insufficient documentation

## 2018-02-19 DIAGNOSIS — I4891 Unspecified atrial fibrillation: Secondary | ICD-10-CM | POA: Insufficient documentation

## 2018-02-19 LAB — CBC WITH DIFFERENTIAL/PLATELET
BASOS ABS: 0 10*3/uL (ref 0.0–0.1)
BASOS PCT: 0 %
EOS ABS: 0.1 10*3/uL (ref 0.0–0.7)
EOS PCT: 1 %
HCT: 38.6 % — ABNORMAL LOW (ref 39.0–52.0)
Hemoglobin: 12.8 g/dL — ABNORMAL LOW (ref 13.0–17.0)
Lymphocytes Relative: 13 %
Lymphs Abs: 0.6 10*3/uL — ABNORMAL LOW (ref 0.7–4.0)
MCH: 31.6 pg (ref 26.0–34.0)
MCHC: 33.2 g/dL (ref 30.0–36.0)
MCV: 95.3 fL (ref 78.0–100.0)
MONO ABS: 0.4 10*3/uL (ref 0.1–1.0)
Monocytes Relative: 10 %
Neutro Abs: 3.2 10*3/uL (ref 1.7–7.7)
Neutrophils Relative %: 76 %
PLATELETS: 210 10*3/uL (ref 150–400)
RBC: 4.05 MIL/uL — ABNORMAL LOW (ref 4.22–5.81)
RDW: 14.2 % (ref 11.5–15.5)
WBC: 4.3 10*3/uL (ref 4.0–10.5)

## 2018-02-19 LAB — CBG MONITORING, ED: Glucose-Capillary: 115 mg/dL — ABNORMAL HIGH (ref 65–99)

## 2018-02-19 LAB — PROTIME-INR
INR: 1.05
PROTHROMBIN TIME: 13.6 s (ref 11.4–15.2)

## 2018-02-19 LAB — BASIC METABOLIC PANEL
Anion gap: 10 (ref 5–15)
BUN: 29 mg/dL — ABNORMAL HIGH (ref 6–20)
CHLORIDE: 97 mmol/L — AB (ref 101–111)
CO2: 26 mmol/L (ref 22–32)
Calcium: 9 mg/dL (ref 8.9–10.3)
Creatinine, Ser: 1.2 mg/dL (ref 0.61–1.24)
GFR calc non Af Amer: 51 mL/min — ABNORMAL LOW (ref >60)
GFR, EST AFRICAN AMERICAN: 60 mL/min — AB (ref >60)
Glucose, Bld: 113 mg/dL — ABNORMAL HIGH (ref 65–99)
POTASSIUM: 4.6 mmol/L (ref 3.5–5.1)
SODIUM: 133 mmol/L — AB (ref 135–145)

## 2018-02-19 MED ORDER — SODIUM CHLORIDE 0.9 % IV BOLUS (SEPSIS)
500.0000 mL | Freq: Once | INTRAVENOUS | Status: AC
  Administered 2018-02-19: 500 mL via INTRAVENOUS

## 2018-02-19 MED ORDER — LABETALOL HCL 200 MG PO TABS
200.0000 mg | ORAL_TABLET | Freq: Two times a day (BID) | ORAL | Status: DC
Start: 2018-02-20 — End: 2018-02-20
  Administered 2018-02-20: 200 mg via ORAL
  Filled 2018-02-19: qty 1

## 2018-02-19 MED ORDER — SODIUM CHLORIDE 0.9 % IV SOLN
INTRAVENOUS | Status: DC
  Administered 2018-02-19: 20:00:00 via INTRAVENOUS

## 2018-02-19 MED ORDER — HYDRALAZINE HCL 50 MG PO TABS
100.0000 mg | ORAL_TABLET | Freq: Three times a day (TID) | ORAL | Status: DC
  Administered 2018-02-20: 100 mg via ORAL
  Filled 2018-02-19: qty 2

## 2018-02-19 NOTE — ED Triage Notes (Addendum)
Per [facility]- Pt resides at [facility]. Unwitnessed fall. Pt states he slipped out of wheelchair and hit the back of his head on the wheelchair causing abrasion. No LOC, neck or back pain. [place]. No other complaints. NEG STROKE SCREEN
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
## 2018-02-19 NOTE — ED Notes (Signed)
Pt remembers the entire event, denies LOC. Stated that the pt became weak and his legs "gave out." Pt has been dealing with a sickness and has not been eating or drinking normally.

## 2018-02-19 NOTE — Discharge Instructions (Signed)
Continue your current medications, follow-up with your doctor next week if your symptoms are not improving, return as needed for worsening symptoms

## 2018-02-20 DIAGNOSIS — S0990XA Unspecified injury of head, initial encounter: Secondary | ICD-10-CM | POA: Diagnosis not present

## 2018-02-20 NOTE — ED Notes (Signed)
Patient taken by [facility] to his facility.

## 2018-02-20 NOTE — ED Notes (Signed)
Pt has not received his nighttime HTN medications. He is going to be given those before [facility] transports.

## 2018-02-20 NOTE — ED Notes (Signed)
[facility] called for pt 

## 2018-06-01 ENCOUNTER — Ambulatory Visit: Payer: Medicare Other | Admitting: Adult Health

## 2018-06-03 ENCOUNTER — Ambulatory Visit: Payer: Medicare Other | Admitting: Adult Health

## 2018-07-14 ENCOUNTER — Emergency Department (HOSPITAL_COMMUNITY): Payer: Medicare Other

## 2018-07-14 ENCOUNTER — Other Ambulatory Visit: Payer: Self-pay

## 2018-07-14 ENCOUNTER — Encounter (HOSPITAL_COMMUNITY): Payer: Self-pay | Admitting: Emergency Medicine

## 2018-07-14 ENCOUNTER — Emergency Department (HOSPITAL_COMMUNITY)
Admission: EM | Admit: 2018-07-14 | Discharge: 2018-07-14 | Disposition: A | Discharge status: Home / Self Care | Payer: Medicare Other | Attending: Emergency Medicine | Admitting: Emergency Medicine

## 2018-07-14 DIAGNOSIS — I110 Hypertensive heart disease with heart failure: Secondary | ICD-10-CM | POA: Diagnosis not present

## 2018-07-14 DIAGNOSIS — I2510 Atherosclerotic heart disease of native coronary artery without angina pectoris: Secondary | ICD-10-CM | POA: Diagnosis not present

## 2018-07-14 DIAGNOSIS — I509 Heart failure, unspecified: Secondary | ICD-10-CM | POA: Insufficient documentation

## 2018-07-14 DIAGNOSIS — Z951 Presence of aortocoronary bypass graft: Secondary | ICD-10-CM | POA: Diagnosis not present

## 2018-07-14 DIAGNOSIS — Z79899 Other long term (current) drug therapy: Secondary | ICD-10-CM | POA: Diagnosis not present

## 2018-07-14 DIAGNOSIS — Z87891 Personal history of nicotine dependence: Secondary | ICD-10-CM | POA: Insufficient documentation

## 2018-07-14 DIAGNOSIS — R531 Weakness: Secondary | ICD-10-CM | POA: Insufficient documentation

## 2018-07-14 LAB — CBC WITH DIFFERENTIAL/PLATELET
Basophils Absolute: 0.1 10*3/uL (ref 0.0–0.1)
Basophils Relative: 1 %
EOS ABS: 0.2 10*3/uL (ref 0.0–0.7)
EOS PCT: 6 %
HCT: 35.1 % — ABNORMAL LOW (ref 39.0–52.0)
HEMOGLOBIN: 11.5 g/dL — AB (ref 13.0–17.0)
LYMPHS ABS: 0.4 10*3/uL — AB (ref 0.7–4.0)
Lymphocytes Relative: 11 %
MCH: 31.4 pg (ref 26.0–34.0)
MCHC: 32.8 g/dL (ref 30.0–36.0)
MCV: 95.9 fL (ref 78.0–100.0)
MONOS PCT: 15 %
Monocytes Absolute: 0.6 10*3/uL (ref 0.1–1.0)
Neutro Abs: 2.7 10*3/uL (ref 1.7–7.7)
Neutrophils Relative %: 67 %
PLATELETS: 179 10*3/uL (ref 150–400)
RBC: 3.66 MIL/uL — ABNORMAL LOW (ref 4.22–5.81)
RDW: 14.4 % (ref 11.5–15.5)
WBC: 4 10*3/uL (ref 4.0–10.5)

## 2018-07-14 LAB — COMPREHENSIVE METABOLIC PANEL
ALT: 16 U/L (ref 0–44)
AST: 29 U/L (ref 15–41)
Albumin: 3.8 g/dL (ref 3.5–5.0)
Alkaline Phosphatase: 108 U/L (ref 38–126)
Anion gap: 10 (ref 5–15)
BUN: 35 mg/dL — AB (ref 8–23)
CHLORIDE: 102 mmol/L (ref 98–111)
CO2: 25 mmol/L (ref 22–32)
CREATININE: 1.29 mg/dL — AB (ref 0.61–1.24)
Calcium: 9 mg/dL (ref 8.9–10.3)
GFR calc Af Amer: 55 mL/min — ABNORMAL LOW (ref >60)
GFR calc non Af Amer: 47 mL/min — ABNORMAL LOW (ref >60)
Glucose, Bld: 84 mg/dL (ref 70–99)
Potassium: 4.6 mmol/L (ref 3.5–5.1)
SODIUM: 137 mmol/L (ref 135–145)
Total Bilirubin: 0.9 mg/dL (ref 0.3–1.2)
Total Protein: 6.9 g/dL (ref 6.5–8.1)

## 2018-07-14 LAB — PROTIME-INR
INR: 1.21
Prothrombin Time: 15.2 seconds (ref 11.4–15.2)

## 2018-07-14 LAB — TROPONIN I: Troponin I: <0.03 ng/mL (ref <0.03)

## 2018-07-14 LAB — BRAIN NATRIURETIC PEPTIDE: B NATRIURETIC PEPTIDE 5: 283.5 pg/mL — AB (ref 0.0–100.0)

## 2018-07-14 NOTE — ED Notes (Signed)
Bed: WA08 Expected date:  Expected time:  Means of arrival:  Comments: EMS-hypotensive

## 2018-07-14 NOTE — ED Triage Notes (Signed)
Patient is coming from assisted living, [facility]. Patient has complaints of generalized weakness. Patient doesn't seem to be in any distress.

## 2018-07-14 NOTE — ED Notes (Signed)
Notified [facility] for transportation back to [facility].

## 2018-07-14 NOTE — Discharge Instructions (Addendum)
Please return for any problem. Follow up with your regular physician as instructed.  °
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
## 2018-09-21 ENCOUNTER — Other Ambulatory Visit: Payer: Self-pay | Admitting: Nurse Practitioner

## 2018-09-27 ENCOUNTER — Emergency Department (HOSPITAL_COMMUNITY): Payer: Medicare Other

## 2018-09-27 ENCOUNTER — Inpatient Hospital Stay (HOSPITAL_COMMUNITY)
Admission: EM | Admit: 2018-09-27 | Discharge: 2018-10-02 | DRG: 193 | Disposition: A | Discharge status: Skilled Nursing Facility | Payer: Medicare Other | Attending: Internal Medicine | Admitting: Internal Medicine

## 2018-09-27 ENCOUNTER — Encounter (HOSPITAL_COMMUNITY): Payer: Self-pay

## 2018-09-27 ENCOUNTER — Other Ambulatory Visit: Payer: Self-pay

## 2018-09-27 DIAGNOSIS — Z79899 Other long term (current) drug therapy: Secondary | ICD-10-CM

## 2018-09-27 DIAGNOSIS — R071 Chest pain on breathing: Secondary | ICD-10-CM | POA: Diagnosis not present

## 2018-09-27 DIAGNOSIS — R079 Chest pain, unspecified: Secondary | ICD-10-CM | POA: Diagnosis present

## 2018-09-27 DIAGNOSIS — M4856XA Collapsed vertebra, not elsewhere classified, lumbar region, initial encounter for fracture: Secondary | ICD-10-CM | POA: Diagnosis present

## 2018-09-27 DIAGNOSIS — F419 Anxiety disorder, unspecified: Secondary | ICD-10-CM | POA: Diagnosis present

## 2018-09-27 DIAGNOSIS — Z7901 Long term (current) use of anticoagulants: Secondary | ICD-10-CM

## 2018-09-27 DIAGNOSIS — R0602 Shortness of breath: Secondary | ICD-10-CM | POA: Diagnosis not present

## 2018-09-27 DIAGNOSIS — R001 Bradycardia, unspecified: Secondary | ICD-10-CM | POA: Diagnosis not present

## 2018-09-27 DIAGNOSIS — I714 Abdominal aortic aneurysm, without rupture, unspecified: Secondary | ICD-10-CM | POA: Diagnosis present

## 2018-09-27 DIAGNOSIS — J189 Pneumonia, unspecified organism: Secondary | ICD-10-CM | POA: Diagnosis present

## 2018-09-27 DIAGNOSIS — F329 Major depressive disorder, single episode, unspecified: Secondary | ICD-10-CM | POA: Diagnosis present

## 2018-09-27 DIAGNOSIS — Z888 Allergy status to other drugs, medicaments and biological substances status: Secondary | ICD-10-CM

## 2018-09-27 DIAGNOSIS — Z66 Do not resuscitate: Secondary | ICD-10-CM | POA: Diagnosis present

## 2018-09-27 DIAGNOSIS — R569 Unspecified convulsions: Secondary | ICD-10-CM

## 2018-09-27 DIAGNOSIS — Z974 Presence of external hearing-aid: Secondary | ICD-10-CM

## 2018-09-27 DIAGNOSIS — R109 Unspecified abdominal pain: Secondary | ICD-10-CM | POA: Diagnosis present

## 2018-09-27 DIAGNOSIS — I160 Hypertensive urgency: Secondary | ICD-10-CM | POA: Diagnosis present

## 2018-09-27 DIAGNOSIS — Z87891 Personal history of nicotine dependence: Secondary | ICD-10-CM

## 2018-09-27 DIAGNOSIS — Z8673 Personal history of transient ischemic attack (TIA), and cerebral infarction without residual deficits: Secondary | ICD-10-CM

## 2018-09-27 DIAGNOSIS — N400 Enlarged prostate without lower urinary tract symptoms: Secondary | ICD-10-CM | POA: Diagnosis present

## 2018-09-27 DIAGNOSIS — R778 Other specified abnormalities of plasma proteins: Secondary | ICD-10-CM

## 2018-09-27 DIAGNOSIS — D649 Anemia, unspecified: Secondary | ICD-10-CM | POA: Diagnosis present

## 2018-09-27 DIAGNOSIS — I4891 Unspecified atrial fibrillation: Secondary | ICD-10-CM | POA: Diagnosis present

## 2018-09-27 DIAGNOSIS — R296 Repeated falls: Secondary | ICD-10-CM | POA: Diagnosis present

## 2018-09-27 DIAGNOSIS — G40409 Other generalized epilepsy and epileptic syndromes, not intractable, without status epilepticus: Secondary | ICD-10-CM | POA: Diagnosis present

## 2018-09-27 DIAGNOSIS — Z8249 Family history of ischemic heart disease and other diseases of the circulatory system: Secondary | ICD-10-CM

## 2018-09-27 DIAGNOSIS — I2510 Atherosclerotic heart disease of native coronary artery without angina pectoris: Secondary | ICD-10-CM

## 2018-09-27 DIAGNOSIS — I4811 Longstanding persistent atrial fibrillation: Secondary | ICD-10-CM | POA: Diagnosis not present

## 2018-09-27 DIAGNOSIS — J9601 Acute respiratory failure with hypoxia: Secondary | ICD-10-CM | POA: Diagnosis not present

## 2018-09-27 DIAGNOSIS — R791 Abnormal coagulation profile: Secondary | ICD-10-CM | POA: Diagnosis present

## 2018-09-27 DIAGNOSIS — F0390 Unspecified dementia without behavioral disturbance: Secondary | ICD-10-CM | POA: Diagnosis present

## 2018-09-27 DIAGNOSIS — R7989 Other specified abnormal findings of blood chemistry: Secondary | ICD-10-CM | POA: Diagnosis present

## 2018-09-27 DIAGNOSIS — I708 Atherosclerosis of other arteries: Secondary | ICD-10-CM | POA: Diagnosis present

## 2018-09-27 DIAGNOSIS — I080 Rheumatic disorders of both mitral and aortic valves: Secondary | ICD-10-CM | POA: Diagnosis present

## 2018-09-27 DIAGNOSIS — Z951 Presence of aortocoronary bypass graft: Secondary | ICD-10-CM

## 2018-09-27 DIAGNOSIS — K760 Fatty (change of) liver, not elsewhere classified: Secondary | ICD-10-CM | POA: Diagnosis present

## 2018-09-27 DIAGNOSIS — H9190 Unspecified hearing loss, unspecified ear: Secondary | ICD-10-CM | POA: Diagnosis present

## 2018-09-27 DIAGNOSIS — J690 Pneumonitis due to inhalation of food and vomit: Secondary | ICD-10-CM | POA: Diagnosis present

## 2018-09-27 DIAGNOSIS — R269 Unspecified abnormalities of gait and mobility: Secondary | ICD-10-CM | POA: Diagnosis present

## 2018-09-27 DIAGNOSIS — R188 Other ascites: Secondary | ICD-10-CM | POA: Diagnosis present

## 2018-09-27 DIAGNOSIS — I4821 Permanent atrial fibrillation: Secondary | ICD-10-CM | POA: Diagnosis present

## 2018-09-27 DIAGNOSIS — Z981 Arthrodesis status: Secondary | ICD-10-CM

## 2018-09-27 DIAGNOSIS — J9811 Atelectasis: Secondary | ICD-10-CM | POA: Diagnosis present

## 2018-09-27 DIAGNOSIS — K828 Other specified diseases of gallbladder: Secondary | ICD-10-CM | POA: Diagnosis present

## 2018-09-27 DIAGNOSIS — K219 Gastro-esophageal reflux disease without esophagitis: Secondary | ICD-10-CM | POA: Diagnosis present

## 2018-09-27 DIAGNOSIS — Z23 Encounter for immunization: Secondary | ICD-10-CM

## 2018-09-27 DIAGNOSIS — N179 Acute kidney failure, unspecified: Secondary | ICD-10-CM | POA: Diagnosis not present

## 2018-09-27 DIAGNOSIS — E785 Hyperlipidemia, unspecified: Secondary | ICD-10-CM | POA: Diagnosis present

## 2018-09-27 DIAGNOSIS — I10 Essential (primary) hypertension: Secondary | ICD-10-CM | POA: Diagnosis present

## 2018-09-27 LAB — COMPREHENSIVE METABOLIC PANEL
ALBUMIN: 4 g/dL (ref 3.5–5.0)
ALK PHOS: 129 U/L — AB (ref 38–126)
ALT: 16 U/L (ref 0–44)
AST: 31 U/L (ref 15–41)
Anion gap: 11 (ref 5–15)
BILIRUBIN TOTAL: 0.9 mg/dL (ref 0.3–1.2)
BUN: 28 mg/dL — AB (ref 8–23)
CALCIUM: 9.2 mg/dL (ref 8.9–10.3)
CO2: 26 mmol/L (ref 22–32)
CREATININE: 1.16 mg/dL (ref 0.61–1.24)
Chloride: 98 mmol/L (ref 98–111)
GFR calc Af Amer: >60 mL/min (ref >60)
GFR, EST NON AFRICAN AMERICAN: 54 mL/min — AB (ref >60)
Glucose, Bld: 103 mg/dL — ABNORMAL HIGH (ref 70–99)
Potassium: 5 mmol/L (ref 3.5–5.1)
Sodium: 135 mmol/L (ref 135–145)
TOTAL PROTEIN: 7.5 g/dL (ref 6.5–8.1)

## 2018-09-27 LAB — LIPASE, BLOOD: Lipase: 35 U/L (ref 11–51)

## 2018-09-27 LAB — URINALYSIS, ROUTINE W REFLEX MICROSCOPIC
Bacteria, UA: NONE SEEN
Bilirubin Urine: NEGATIVE
Glucose, UA: NEGATIVE mg/dL
Hgb urine dipstick: NEGATIVE
Ketones, ur: NEGATIVE mg/dL
Leukocytes, UA: NEGATIVE
Nitrite: NEGATIVE
PH: 7 (ref 5.0–8.0)
Protein, ur: 30 mg/dL — AB
SPECIFIC GRAVITY, URINE: 1.014 (ref 1.005–1.030)

## 2018-09-27 LAB — CBC WITH DIFFERENTIAL/PLATELET
Abs Immature Granulocytes: 0.01 10*3/uL (ref 0.00–0.07)
BASOS ABS: 0.1 10*3/uL (ref 0.0–0.1)
Basophils Relative: 1 %
EOS PCT: 6 %
Eosinophils Absolute: 0.3 10*3/uL (ref 0.0–0.5)
HEMATOCRIT: 42.3 % (ref 39.0–52.0)
HEMOGLOBIN: 13.6 g/dL (ref 13.0–17.0)
IMMATURE GRANULOCYTES: 0 %
LYMPHS ABS: 0.7 10*3/uL (ref 0.7–4.0)
LYMPHS PCT: 13 %
MCH: 31 pg (ref 26.0–34.0)
MCHC: 32.2 g/dL (ref 30.0–36.0)
MCV: 96.4 fL (ref 80.0–100.0)
MONOS PCT: 10 %
Monocytes Absolute: 0.6 10*3/uL (ref 0.1–1.0)
NEUTROS PCT: 70 %
NRBC: 0 % (ref 0.0–0.2)
Neutro Abs: 3.8 10*3/uL (ref 1.7–7.7)
Platelets: 170 10*3/uL (ref 150–400)
RBC: 4.39 MIL/uL (ref 4.22–5.81)
RDW: 14 % (ref 11.5–15.5)
WBC: 5.5 10*3/uL (ref 4.0–10.5)

## 2018-09-27 LAB — PROTIME-INR
INR: 0.94
PROTHROMBIN TIME: 12.5 s (ref 11.4–15.2)

## 2018-09-27 LAB — I-STAT CG4 LACTIC ACID, ED: LACTIC ACID, VENOUS: 1.27 mmol/L (ref 0.5–1.9)

## 2018-09-27 LAB — TROPONIN I: Troponin I: 0.03 ng/mL (ref <0.03)

## 2018-09-27 MED ORDER — HYDRALAZINE HCL 50 MG PO TABS
100.0000 mg | ORAL_TABLET | Freq: Three times a day (TID) | ORAL | Status: DC
  Administered 2018-09-28 (×2): 100 mg via ORAL
  Filled 2018-09-27 (×2): qty 2

## 2018-09-27 MED ORDER — SODIUM CHLORIDE 0.9 % IV SOLN
2.0000 g | INTRAVENOUS | Status: DC
  Administered 2018-09-27 – 2018-09-29 (×3): 2 g via INTRAVENOUS
  Filled 2018-09-27: qty 2
  Filled 2018-09-27 (×4): qty 20

## 2018-09-27 MED ORDER — SODIUM CHLORIDE 0.9 % IJ SOLN
INTRAMUSCULAR | Status: AC
  Filled 2018-09-27: qty 50

## 2018-09-27 MED ORDER — LABETALOL HCL 200 MG PO TABS
200.0000 mg | ORAL_TABLET | Freq: Two times a day (BID) | ORAL | Status: DC
  Administered 2018-09-28 (×2): 200 mg via ORAL
  Filled 2018-09-27 (×2): qty 1

## 2018-09-27 MED ORDER — HYDRALAZINE HCL 20 MG/ML IJ SOLN
10.0000 mg | Freq: Once | INTRAMUSCULAR | Status: AC
  Administered 2018-09-28: 10 mg via INTRAVENOUS
  Filled 2018-09-27: qty 1

## 2018-09-27 MED ORDER — SODIUM CHLORIDE 0.9 % IV SOLN
500.0000 mg | INTRAVENOUS | Status: DC
  Administered 2018-09-28 – 2018-09-29 (×3): 500 mg via INTRAVENOUS
  Filled 2018-09-27 (×5): qty 500

## 2018-09-27 MED ORDER — LEVETIRACETAM 500 MG PO TABS
750.0000 mg | ORAL_TABLET | Freq: Two times a day (BID) | ORAL | Status: DC
  Administered 2018-09-28 – 2018-10-02 (×10): 750 mg via ORAL
  Filled 2018-09-27 (×11): qty 1

## 2018-09-27 MED ORDER — SODIUM CHLORIDE 0.9 % IV SOLN
INTRAVENOUS | Status: DC
  Administered 2018-09-27: 20:00:00 via INTRAVENOUS

## 2018-09-27 MED ORDER — IOHEXOL 300 MG/ML  SOLN
75.0000 mL | Freq: Once | INTRAMUSCULAR | Status: AC | PRN
  Administered 2018-09-27: 75 mL via INTRAVENOUS
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
## 2018-09-27 NOTE — ED Triage Notes (Signed)
EMS reports from [facility], Staff stated Pt has been feeling weaker than normal and generalized abdominal and back pain.   BP 198/96 HR 68 RR 16 Sp02 95 RA CBG 128

## 2018-09-27 NOTE — ED Notes (Signed)
Patient transported to X-ray 

## 2018-09-27 NOTE — ED Notes (Signed)
Pt placed on 5 lead cardiac monitor
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
## 2018-09-27 NOTE — ED Notes (Addendum)
Date and time results received: 09/27/18 2140 (use smartphrase ".now" to insert current time)  Test: Troponin Critical Value: .03  Name of Provider Notified: Zenia Resides  Orders Received? Or Actions Taken?: Orders Received - See Orders for details

## 2018-09-28 ENCOUNTER — Observation Stay (HOSPITAL_COMMUNITY): Payer: Medicare Other

## 2018-09-28 ENCOUNTER — Observation Stay (HOSPITAL_BASED_OUTPATIENT_CLINIC_OR_DEPARTMENT_OTHER): Payer: Medicare Other

## 2018-09-28 ENCOUNTER — Other Ambulatory Visit: Payer: Self-pay

## 2018-09-28 DIAGNOSIS — I160 Hypertensive urgency: Secondary | ICD-10-CM | POA: Diagnosis present

## 2018-09-28 DIAGNOSIS — I351 Nonrheumatic aortic (valve) insufficiency: Secondary | ICD-10-CM | POA: Diagnosis not present

## 2018-09-28 DIAGNOSIS — K219 Gastro-esophageal reflux disease without esophagitis: Secondary | ICD-10-CM | POA: Diagnosis present

## 2018-09-28 DIAGNOSIS — H9190 Unspecified hearing loss, unspecified ear: Secondary | ICD-10-CM | POA: Diagnosis not present

## 2018-09-28 DIAGNOSIS — G40409 Other generalized epilepsy and epileptic syndromes, not intractable, without status epilepticus: Secondary | ICD-10-CM | POA: Diagnosis present

## 2018-09-28 DIAGNOSIS — R071 Chest pain on breathing: Secondary | ICD-10-CM | POA: Diagnosis not present

## 2018-09-28 DIAGNOSIS — J189 Pneumonia, unspecified organism: Secondary | ICD-10-CM | POA: Diagnosis not present

## 2018-09-28 DIAGNOSIS — Z66 Do not resuscitate: Secondary | ICD-10-CM | POA: Diagnosis not present

## 2018-09-28 DIAGNOSIS — R7989 Other specified abnormal findings of blood chemistry: Secondary | ICD-10-CM | POA: Diagnosis present

## 2018-09-28 DIAGNOSIS — R0602 Shortness of breath: Secondary | ICD-10-CM | POA: Diagnosis present

## 2018-09-28 DIAGNOSIS — I482 Chronic atrial fibrillation, unspecified: Secondary | ICD-10-CM

## 2018-09-28 DIAGNOSIS — K760 Fatty (change of) liver, not elsewhere classified: Secondary | ICD-10-CM | POA: Diagnosis present

## 2018-09-28 DIAGNOSIS — R188 Other ascites: Secondary | ICD-10-CM | POA: Diagnosis not present

## 2018-09-28 DIAGNOSIS — N179 Acute kidney failure, unspecified: Secondary | ICD-10-CM | POA: Diagnosis not present

## 2018-09-28 DIAGNOSIS — Z23 Encounter for immunization: Secondary | ICD-10-CM | POA: Diagnosis not present

## 2018-09-28 DIAGNOSIS — I4821 Permanent atrial fibrillation: Secondary | ICD-10-CM | POA: Diagnosis not present

## 2018-09-28 DIAGNOSIS — R296 Repeated falls: Secondary | ICD-10-CM | POA: Diagnosis present

## 2018-09-28 DIAGNOSIS — R1084 Generalized abdominal pain: Secondary | ICD-10-CM | POA: Diagnosis not present

## 2018-09-28 DIAGNOSIS — N400 Enlarged prostate without lower urinary tract symptoms: Secondary | ICD-10-CM | POA: Diagnosis present

## 2018-09-28 DIAGNOSIS — M4856XA Collapsed vertebra, not elsewhere classified, lumbar region, initial encounter for fracture: Secondary | ICD-10-CM | POA: Diagnosis not present

## 2018-09-28 DIAGNOSIS — E785 Hyperlipidemia, unspecified: Secondary | ICD-10-CM | POA: Diagnosis present

## 2018-09-28 DIAGNOSIS — R001 Bradycardia, unspecified: Secondary | ICD-10-CM

## 2018-09-28 DIAGNOSIS — I714 Abdominal aortic aneurysm, without rupture: Secondary | ICD-10-CM | POA: Diagnosis present

## 2018-09-28 DIAGNOSIS — F419 Anxiety disorder, unspecified: Secondary | ICD-10-CM | POA: Diagnosis not present

## 2018-09-28 DIAGNOSIS — F329 Major depressive disorder, single episode, unspecified: Secondary | ICD-10-CM | POA: Diagnosis present

## 2018-09-28 DIAGNOSIS — J9811 Atelectasis: Secondary | ICD-10-CM | POA: Diagnosis not present

## 2018-09-28 DIAGNOSIS — I350 Nonrheumatic aortic (valve) stenosis: Secondary | ICD-10-CM

## 2018-09-28 DIAGNOSIS — I2510 Atherosclerotic heart disease of native coronary artery without angina pectoris: Secondary | ICD-10-CM | POA: Diagnosis present

## 2018-09-28 DIAGNOSIS — J9601 Acute respiratory failure with hypoxia: Secondary | ICD-10-CM | POA: Diagnosis not present

## 2018-09-28 DIAGNOSIS — R269 Unspecified abnormalities of gait and mobility: Secondary | ICD-10-CM | POA: Diagnosis not present

## 2018-09-28 LAB — COMPREHENSIVE METABOLIC PANEL
ALT: 16 U/L (ref 0–44)
AST: 27 U/L (ref 15–41)
Albumin: 3.3 g/dL — ABNORMAL LOW (ref 3.5–5.0)
Alkaline Phosphatase: 99 U/L (ref 38–126)
Anion gap: 9 (ref 5–15)
BUN: 23 mg/dL (ref 8–23)
CO2: 24 mmol/L (ref 22–32)
Calcium: 8.5 mg/dL — ABNORMAL LOW (ref 8.9–10.3)
Chloride: 98 mmol/L (ref 98–111)
Creatinine, Ser: 1.09 mg/dL (ref 0.61–1.24)
GFR calc Af Amer: >60 mL/min (ref >60)
GFR calc non Af Amer: 58 mL/min — ABNORMAL LOW (ref >60)
Glucose, Bld: 110 mg/dL — ABNORMAL HIGH (ref 70–99)
Potassium: 4.7 mmol/L (ref 3.5–5.1)
Sodium: 131 mmol/L — ABNORMAL LOW (ref 135–145)
Total Bilirubin: 0.8 mg/dL (ref 0.3–1.2)
Total Protein: 6.1 g/dL — ABNORMAL LOW (ref 6.5–8.1)

## 2018-09-28 LAB — CBC
HCT: 38.9 % — ABNORMAL LOW (ref 39.0–52.0)
Hemoglobin: 12.3 g/dL — ABNORMAL LOW (ref 13.0–17.0)
MCH: 30.8 pg (ref 26.0–34.0)
MCHC: 31.6 g/dL (ref 30.0–36.0)
MCV: 97.3 fL (ref 80.0–100.0)
Platelets: 148 10*3/uL — ABNORMAL LOW (ref 150–400)
RBC: 4 MIL/uL — ABNORMAL LOW (ref 4.22–5.81)
RDW: 14 % (ref 11.5–15.5)
WBC: 5 10*3/uL (ref 4.0–10.5)
nRBC: 0 % (ref 0.0–0.2)

## 2018-09-28 LAB — TROPONIN I
Troponin I: 0.03 ng/mL (ref <0.03)
Troponin I: 0.03 ng/mL (ref <0.03)
Troponin I: 0.03 ng/mL (ref <0.03)

## 2018-09-28 LAB — PHOSPHORUS: PHOSPHORUS: 3.4 mg/dL (ref 2.5–4.6)

## 2018-09-28 LAB — ECHOCARDIOGRAM COMPLETE: WEIGHTICAEL: 2088.2 [oz_av]

## 2018-09-28 LAB — MAGNESIUM: Magnesium: 2.1 mg/dL (ref 1.7–2.4)

## 2018-09-28 LAB — HIV ANTIBODY (ROUTINE TESTING W REFLEX): HIV Screen 4th Generation wRfx: NONREACTIVE

## 2018-09-28 LAB — STREP PNEUMONIAE URINARY ANTIGEN: Strep Pneumo Urinary Antigen: NEGATIVE

## 2018-09-28 MED ORDER — SODIUM CHLORIDE 0.9 % IV SOLN
INTRAVENOUS | Status: DC
  Administered 2018-09-28 – 2018-10-01 (×6): via INTRAVENOUS

## 2018-09-28 MED ORDER — ENSURE ENLIVE PO LIQD
237.0000 mL | Freq: Two times a day (BID) | ORAL | Status: DC
  Administered 2018-09-29 – 2018-10-02 (×6): 237 mL via ORAL

## 2018-09-28 MED ORDER — POLYETHYLENE GLYCOL 3350 17 G PO PACK
17.0000 g | PACK | Freq: Two times a day (BID) | ORAL | Status: DC
  Administered 2018-09-28 – 2018-10-02 (×8): 17 g via ORAL
  Filled 2018-09-28 (×9): qty 1

## 2018-09-28 MED ORDER — SODIUM CHLORIDE 0.9 % IV SOLN: 1.0000 g | INTRAVENOUS | Status: DC

## 2018-09-28 MED ORDER — INFLUENZA VAC SPLIT HIGH-DOSE 0.5 ML IM SUSY
0.5000 mL | PREFILLED_SYRINGE | INTRAMUSCULAR | Status: AC
  Administered 2018-09-29: 0.5 mL via INTRAMUSCULAR
  Filled 2018-09-28: qty 0.5

## 2018-09-28 MED ORDER — PANTOPRAZOLE SODIUM 40 MG PO TBEC
40.0000 mg | DELAYED_RELEASE_TABLET | Freq: Two times a day (BID) | ORAL | Status: DC
  Administered 2018-09-28 – 2018-10-02 (×10): 40 mg via ORAL
  Filled 2018-09-28 (×10): qty 1

## 2018-09-28 MED ORDER — SODIUM CHLORIDE 0.9 % IV SOLN: 500.0000 mg | INTRAVENOUS | Status: DC

## 2018-09-28 MED ORDER — HYDRALAZINE HCL 20 MG/ML IJ SOLN
10.0000 mg | Freq: Once | INTRAMUSCULAR | Status: AC
  Administered 2018-09-28: 10 mg via INTRAVENOUS
  Filled 2018-09-28: qty 1

## 2018-09-28 NOTE — Progress Notes (Signed)
PROGRESS NOTE    Joseph Bentley  VEH:[id number] DOB: 1926/02/16 DOA: 09/27/2018 PCP: Reymundo Poll, MD   Brief Narrative:  HPI per Dr. Jossie Ng on 09/27/18 Joseph Bentley is a 84 y.o. male with medical history significant of coronary artery disease, seizure disorder, GERD, hypertension, atrial fibrillation on chronic warfarin therapy who presented to the ER with chest pain and shortness of breath.  Patient was doing all right until this evening when he felt substernal chest pain.  Pain is in the epigastric region with some abdominal discomfort.  Denied any cough or fever.  Denied any nausea vomiting or diarrhea.  Patient also denies hematemesis or melena.  Pain was diffuse.  He is hard of hearing and a poor historian.  Work-up was done in the ER showing minimally elevated troponin at 0.03 by CT abdomen and pelvis confirms bilateral pneumonia.  Also other incidental findings.  Patient is being admitted with a diagnosis of community-acquired pneumonia.  His chest pain seems to be atypical.  ED Course: Patient's temperature is 98.4 blood pressure 200/85 his pulse is 70 respiratory rate of 30 oxygen sat 92% on room air.  He has a white count of 5.5 hemoglobin 13.6 and platelet 170.  Sodium 135 potassium 5.0, chloride 98, CO2 26 with BUN 28 and creatinine 1.16.  Troponin 0 0.03 INR 0.94 and glucose 103.  Lactic acid is 1.27.  Urinalysis essentially negative.  CT abdomen and pelvis showed bilateral pleural effusion with infiltrates in the lung bases probably pneumonia or aspiration, distended gallbladder with suggestion of sludge diffuse fatty infiltration of the liver, 4.1 cm infrarenal abdominal aortic aneurysm with high-grade stenosis in the distal left external iliac artery, compression deformities at L1 and L4.  **She is very hard of hearing and denies any complaints at this time however does have some mild abdominal tenderness on palpation the right side.  Cardiology consulted as patient became  bradycardic and started having pauses.  We will hold all further labetalol doses.  Assessment & Plan:   Principal Problem:   Community acquired pneumonia Active Problems:   Atrial fibrillation ([place])   CAD (coronary artery disease)   GERD (gastroesophageal reflux disease)   Hypertensive urgency   Chest pain  Community-acquired pneumonia versus aspiration pneumonitis -He is afebrile and has no white count -CT of the abdomen pelvis with contrast minimal bilateral pleural effusions with coarse infiltrates in the lung bases likely representing pneumonia or aspiration -Chest x-ray showed low lung volumes and hazy opacities of the lung bases bilaterally which favored atelectasis there is chronic pleural-parenchymal scarring at the right costophrenic angle with elevation the right hemidiaphragm -Chest pain could be pleuritic in nature -Continue with IV ceftriaxone and IV azithromycin -Check blood and sputum culture -Blood cultures are pending -Repeat chest x-ray in the a.m.  Chest pain rule out ACS/history of CAD -Troponins have been flat at 0.03 and seems atypical -Likely in the setting of pneumonia, A. fib, and likely pleuritic in nature -Cardiology consulted for further evaluation recommendations -Echocardiogram is being done and showed EF of 60-65% with Normal Wall motion  -Beta-blockers on hold due to bradycardia -Not on aspirin -Cardiology would not pursue further ischemic evaluation particularly in the patient's age  Atrial fibrillation with atrial flutter with slow ventricular response and Bradycardia with Pauses  -Hold beta-blocker currently -Continue to monitor on telemetry -Has been previously on Coumadin but unclear if he is actually taking -Cardiology consulted for further evaluation and recommending holding labetalol again -Patient is asymptomatic  Aortic Stenosis -ECHOCardiogram shows moderate stenosis -Cardiology following   Hypertension -Currently holding  labetalol -Per cardiology he will likely need an additional antihypertensive medication that does not have AV nodal blocking properties -Cardiology recommending following blood pressure and adding as needed.  Patient was given IV hydralazine 10 mg once yesterday  Abdominal Pain -Patient has gallbladder distention and will obtain a right upper quadrant ultrasound to rule out cholecystitis -CT scan showed distended gallbladder with suggestion of layering sludge but no stones or wall thickening and there is diffuse fatty infiltration of the liver -Ultrasound showed gallbladder sludge and small amount of pericholecystic fluid without other evidence of acute cholecystitis -There is a small amount perihepatic ascites -We will discuss with general surgery in the a.m. about possible evaluation  Infrarenal abdominal aortic aneurysm -Was 4.1 cm and focal high-grade stenosis in the distal left external iliac artery noted -We will need outpatient follow-up with vascular surgery and cardiology  Compression fractures of the spine -It was an incidental finding involves L1 and L4 -Patient asymptomatic -Continue to monitor  GERD -This will cause of patient's abdominal pain and epigastric pain.  Continue with PPIs with pantoprazole 40 mill grams p.o. twice daily  History of epilepsy and history of grand mal seizure -Last seizure was about 5 years ago -Continue with levetiracetam home dose at 750 mg po BID  DVT prophylaxis: SCD's; Has been on Coumadin in the past but currently being held? Code Status: Listed as Full Code in Chart but has DNR Paperwork at Home. Remains DNR as patient confirmed it today  Family Communication: No family present at bedside  Disposition Plan: Remain Inpatient for further workup and treatment  Consultants:   Cardiology Dr. Stanford Breed   Procedures:  ECHOCARDIOGRAM ------------------------------------------------------------------- Study Conclusions  - Left ventricle:  The cavity size was normal. There was mild   concentric hypertrophy. Systolic function was normal. The   estimated ejection fraction was in the range of 60% to 65%. Wall   motion was normal; there were no regional wall motion   abnormalities. - Ventricular septum: The contour showed systolic flattening   consistent with right ventricular pressure overload. - Aortic valve: Valve mobility was restricted. There was moderate   stenosis. There was mild regurgitation. Peak velocity (S): 359   cm/s. Mean gradient (S): 29 mm Hg. Valve area (VTI): 0.44 cm^2.   Valve area (Vmax): 0.48 cm^2. Valve area (Vmean): 0.46 cm^2. - Aorta: Ascending aortic diameter: 38 mm (S). - Ascending aorta: The ascending aorta was mildly dilated. - Mitral valve: Transvalvular velocity was within the normal range.   There was no evidence for stenosis. There was mild to moderate   regurgitation. Valve area by continuity equation (using LVOT   flow): 1.25 cm^2. - Left atrium: The atrium was severely dilated. - Right ventricle: The cavity size was mildly dilated. Wall   thickness was normal. Systolic function was mildly reduced. - Right atrium: The atrium was severely dilated. - Atrial septum: No defect or patent foramen ovale was identified   by color flow Doppler. - Tricuspid valve: There was moderate regurgitation. - Pulmonary arteries: Systolic pressure was severely increased. PA   peak pressure: 82 mm Hg (S).  Impressions:  - Compared with the echo 01/08/13, PASP has increased from 55 mmHg   to 82 mmHg.   Antimicrobials:  Anti-infectives (From admission, onward)   Start     Dose/Rate Route Frequency Ordered Stop   09/28/18 0103  cefTRIAXone (ROCEPHIN) 1 g in sodium chloride 0.9 %  100 mL IVPB  Status:  Discontinued     1 g 200 mL/hr over 30 Minutes Intravenous Every 24 hours 09/28/18 0104 09/28/18 0120   09/28/18 0103  azithromycin (ZITHROMAX) 500 mg in sodium chloride 0.9 % 250 mL IVPB  Status:   Discontinued     500 mg 250 mL/hr over 60 Minutes Intravenous Every 24 hours 09/28/18 0104 09/28/18 0120   09/27/18 2215  cefTRIAXone (ROCEPHIN) 2 g in sodium chloride 0.9 % 100 mL IVPB     2 g 200 mL/hr over 30 Minutes Intravenous Every 24 hours 09/27/18 2209     09/27/18 2215  azithromycin (ZITHROMAX) 500 mg in sodium chloride 0.9 % 250 mL IVPB     500 mg 250 mL/hr over 60 Minutes Intravenous Every 24 hours 09/27/18 2209       Subjective: And examined at bedside is extremely hard of hearing.  Denies any complaints and denied any chest pain.  Had some abdominal pain but states is not very bad states as well.  No nausea or vomiting.  No other concerns or plans at this time.  Objective: Vitals:   09/28/18 0453 09/28/18 0845 09/28/18 1058 09/28/18 1259  BP:  (!) 163/74 (!) 109/52 (!) 160/75  Pulse:  78  61  Resp:    (!) 32  Temp:    98.2 F (36.8 C)  TempSrc:    Oral  SpO2:    95%  Weight: 59.2 kg       Intake/Output Summary (Last 24 hours) at 09/28/2018 1746 Last data filed at 09/28/2018 1542 Gross per 24 hour  Intake 2131.98 ml  Output 285 ml  Net 1846.98 ml   Filed Weights   09/28/18 0453  Weight: 59.2 kg   Examination: Physical Exam:  Constitutional: She is a thin, frail, elderly Caucasian male who is currently in NAD and appears calm but uncomfortable Eyes: Lids and conjunctivae normal, sclerae anicteric  ENMT: External Ears, Nose appear normal. Extremely hard of hearing. Neck: Appears normal, supple, no cervical masses, normal ROM, no appreciable thyromegaly; No JVD Respiratory: Diminished to auscultation bilaterally, no wheezing, rales, rhonchi or crackles. Normal respiratory effort and patient is not tachypenic. No accessory muscle use.  Cardiovascular: Irregularly Irregular and bradycardic. Has a 3/6 Systolic Murmur, Abdomen: Soft, Somewhat tender, non-distended. No masses palpated. No appreciable hepatosplenomegaly. Bowel sounds positive x3.  GU:  Deferred. Musculoskeletal: No clubbing / cyanosis of digits/nails.  Skin: No rashes, lesions, ulcers on a limited skin evaluation. No induration; Warm and dry.  Neurologic: CN 2-12 grossly intact with no focal deficits. Romberg sign and cerebellar reflexes not assessed.  Psychiatric: Normal judgment and insight. Alert and oriented x 2. Normal and pleasant mood and appropriate affect.   Data Reviewed: I have personally reviewed following labs and imaging studies  CBC: Recent Labs  Lab 09/27/18 1952 09/28/18 0511  WBC 5.5 5.0  NEUTROABS 3.8  --   HGB 13.6 12.3*  HCT 42.3 38.9*  MCV 96.4 97.3  PLT 170 782*   Basic Metabolic Panel: Recent Labs  Lab 09/27/18 1952 09/28/18 0511 09/28/18 0916  NA 135 131*  --   K 5.0 4.7  --   CL 98 98  --   CO2 26 24  --   GLUCOSE 103* 110*  --   BUN 28* 23  --   CREATININE 1.16 1.09  --   CALCIUM 9.2 8.5*  --   MG  --   --  2.1  PHOS  --   --  3.4   GFR: Estimated Creatinine Clearance: 37.7 mL/min (by C-G formula based on SCr of 1.09 mg/dL). Liver Function Tests: Recent Labs  Lab 09/27/18 1952 09/28/18 0511  AST 31 27  ALT 16 16  ALKPHOS 129* 99  BILITOT 0.9 0.8  PROT 7.5 6.1*  ALBUMIN 4.0 3.3*   Recent Labs  Lab 09/27/18 1952  LIPASE 35   No results for input(s): AMMONIA in the last 168 hours. Coagulation Profile: Recent Labs  Lab 09/27/18 1952  INR 0.94   Cardiac Enzymes: Recent Labs  Lab 09/27/18 1952 09/28/18 0916 09/28/18 1453  TROPONINI 0.03* 0.03* 0.03*   BNP (last 3 results) No results for input(s): PROBNP in the last 8760 hours. HbA1C: No results for input(s): HGBA1C in the last 72 hours. CBG: No results for input(s): GLUCAP in the last 168 hours. Lipid Profile: No results for input(s): CHOL, HDL, LDLCALC, TRIG, CHOLHDL, LDLDIRECT in the last 72 hours. Thyroid Function Tests: No results for input(s): TSH, T4TOTAL, FREET4, T3FREE, THYROIDAB in the last 72 hours. Anemia Panel: No results for  input(s): VITAMINB12, FOLATE, FERRITIN, TIBC, IRON, RETICCTPCT in the last 72 hours. Sepsis Labs: Recent Labs  Lab 09/27/18 2000  LATICACIDVEN 1.27    No results found for this or any previous visit (from the past 240 hour(s)).   Radiology Studies: Dg Chest 2 View  Result Date: 09/27/2018 CLINICAL DATA:  Dyspnea EXAM: CHEST - 2 VIEW COMPARISON:  07/14/2018 chest radiograph. FINDINGS: Low lung volumes. Intact sternotomy wires. Stable cardiomediastinal silhouette with mild cardiomegaly. No pneumothorax. Stable chronic blunting of the right costophrenic angle with elevation of the right hemidiaphragm. No convincing pleural effusions. Borderline mild pulmonary edema. Hazy opacities at the lung bases bilaterally. IMPRESSION: 1. Low lung volumes. Hazy opacities at the lung bases bilaterally, favor atelectasis. Chronic pleural-parenchymal scarring at the right costophrenic angle with elevation of the right hemidiaphragm. 2. Stable cardiomegaly with borderline mild pulmonary edema. Electronically Signed   By: Ilona Sorrel M.D.   On: 09/27/2018 19:53   Ct Abdomen Pelvis W Contrast  Result Date: 09/27/2018 CLINICAL DATA:  Weaker than normal with generalized abdominal pain and back pain. EXAM: CT ABDOMEN AND PELVIS WITH CONTRAST TECHNIQUE: Multidetector CT imaging of the abdomen and pelvis was performed using the standard protocol following bolus administration of intravenous contrast. CONTRAST:  66mL OMNIPAQUE IOHEXOL 300 MG/ML  SOLN COMPARISON:  CT pelvis 04/03/2016 FINDINGS: Lower chest: Minimal bilateral effusions with coarse infiltrates in the lung bases possibly representing pneumonia or aspiration. Cardiac enlargement. Postoperative changes in the mediastinum consistent with coronary artery bypass. Hepatobiliary: The gallbladder is distended with suggestion of mild layering sludge. No discrete stones or wall thickening. Bile ducts are not dilated. Mild diffuse fatty infiltration of the liver without  focal lesion. Pancreas: Unremarkable. No pancreatic ductal dilatation or surrounding inflammatory changes. Spleen: Normal in size without focal abnormality. Adrenals/Urinary Tract: No adrenal gland nodules. Asymmetrical left renal atrophy suggesting possibility of chronic renal vascular disease. Calcification in the origins of both renal arteries. Renal nephrograms are symmetrical and homogeneous. No hydronephrosis or hydroureter. Bladder is unremarkable. Stomach/Bowel: Stomach and small bowel are decompressed. Scattered stool throughout the colon without colonic distention. No wall thickening is appreciated. There is mild infiltration in the presacral fat which may represent inflammatory process such as proctitis. No discrete abscess. Appendix is normal. Vascular/Lymphatic: Diffuse aortic calcification. Infrarenal abdominal aortic aneurysm measuring 4.1 cm maximal AP diameter. Suggestion of focal high-grade stenosis in the distal left external iliac artery with reconstitution of flow  distally. No significant lymphadenopathy. Reproductive: Prostate is unremarkable. Other: No free air or free fluid in the abdomen. Abdominal wall musculature appears intact. Musculoskeletal: Diffuse bone demineralization. Old appearing compression deformities at L1 and L4 with kyphoplasty at L4. Changes likely represent osteoporosis. IMPRESSION: 1. Minimal bilateral pleural effusions with coarse infiltrates in the lung bases possibly representing pneumonia or aspiration. 2. Distended gallbladder with suggestion of layering sludge. No stones or wall thickening. 3. Diffuse fatty infiltration of the liver. 4. 4.1 cm infrarenal abdominal aortic aneurysm. Focal high-grade stenosis in the distal left external iliac artery. 5. No evidence of bowel obstruction. Mild infiltration in the presacral fat may represent inflammatory process such as proctitis. 6. Asymmetrical left renal atrophy suggesting chronic renal vascular disease. 7.  Osteoporotic changes in the spine with compression deformities at L1 and L4. Electronically Signed   By: Lucienne Capers M.D.   On: 09/27/2018 21:39   US Abdomen Limited  Result Date: 09/28/2018 CLINICAL DATA:  Abdominal pain EXAM: ULTRASOUND ABDOMEN LIMITED RIGHT UPPER QUADRANT COMPARISON:  CT abdomen pelvis 09/27/2018 FINDINGS: Gallbladder: There is gallbladder sludge and small amount of pericholecystic fluid. A negative sonographic Percell Miller sign was reported by the sonographer. No wall thickening. Common bile duct: Diameter: 4 mm Liver: No focal lesion identified. Within normal limits in parenchymal echogenicity. Portal vein is patent on color Doppler imaging with normal direction of blood flow towards the liver. There is free fluid adjacent to the liver. IMPRESSION: 1. Gallbladder sludge and small amount of pericholecystic fluid without other evidence of acute cholecystitis. 2. Small volume perihepatic ascites. Electronically Signed   By: Ulyses Jarred M.D.   On: 09/28/2018 03:22   Scheduled Meds: . feeding supplement (ENSURE ENLIVE)  237 mL Oral BID BM  . [START ON 09/29/2018] Influenza vac split quadrivalent PF  0.5 mL Intramuscular Tomorrow-1000  . levETIRAcetam  750 mg Oral BID  . pantoprazole  40 mg Oral BID  . polyethylene glycol  17 g Oral BID   Continuous Infusions: . sodium chloride 50 mL/hr at 09/28/18 0600  . azithromycin Stopped (09/28/18 0111)  . cefTRIAXone (ROCEPHIN)  IV Stopped (09/27/18 2322)    LOS: 0 days   Kerney Elbe, DO [facility] PAGER is on [place]  If 7PM-7AM, please contact night-coverage [email or web address] Password [email or web address] 09/28/2018, 5:46 PM

## 2018-09-28 NOTE — Progress Notes (Signed)
  Echocardiogram 2D Echocardiogram has been performed.  Bobbye Charleston 09/28/2018, 12:23 PM

## 2018-09-28 NOTE — Progress Notes (Signed)
Patient Joseph Bentley as low as 28.  Dr. Alfredia Ferguson notified and states cardiology will be consulted.  Patient still asymptomatic at this time; wants to know when he can eat lunch.  BP 109/52.  Will continue to monitor.
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
## 2018-09-28 NOTE — Progress Notes (Addendum)
Notified by CMT that patient HR is dropping into 30s (nonsustained).  Dr. Alfredia Ferguson paged; EKG performed.  Patient alert, awake, no c/o of SOB/CP.  Troponins already ordered by Dr. Alfredia Ferguson this AM. Will continue to monitor patient.

## 2018-09-28 NOTE — Progress Notes (Signed)
Assumed care of patient, agree with previous RN assessment  

## 2018-09-28 NOTE — Progress Notes (Addendum)
Pt manual BP 179/98, NP on call notified. No new orders at this time. Will continue to monitor.    >Orders placed for one time dose of 10mg  IV hydralazine. Will carry out.

## 2018-09-29 ENCOUNTER — Inpatient Hospital Stay (HOSPITAL_COMMUNITY): Payer: Medicare Other

## 2018-09-29 DIAGNOSIS — R109 Unspecified abdominal pain: Secondary | ICD-10-CM

## 2018-09-29 LAB — CBC WITH DIFFERENTIAL/PLATELET
ABS IMMATURE GRANULOCYTES: 0.02 10*3/uL (ref 0.00–0.07)
BASOS PCT: 1 %
Basophils Absolute: 0 10*3/uL (ref 0.0–0.1)
EOS ABS: 0.4 10*3/uL (ref 0.0–0.5)
Eosinophils Relative: 8 %
HCT: 41.8 % (ref 39.0–52.0)
Hemoglobin: 13.6 g/dL (ref 13.0–17.0)
IMMATURE GRANULOCYTES: 0 %
LYMPHS ABS: 0.3 10*3/uL — AB (ref 0.7–4.0)
Lymphocytes Relative: 6 %
MCH: 30.9 pg (ref 26.0–34.0)
MCHC: 32.5 g/dL (ref 30.0–36.0)
MCV: 95 fL (ref 80.0–100.0)
MONO ABS: 0.4 10*3/uL (ref 0.1–1.0)
MONOS PCT: 7 %
NEUTROS ABS: 4.3 10*3/uL (ref 1.7–7.7)
Neutrophils Relative %: 78 %
PLATELETS: 166 10*3/uL (ref 150–400)
RBC: 4.4 MIL/uL (ref 4.22–5.81)
RDW: 13.7 % (ref 11.5–15.5)
WBC: 5.5 10*3/uL (ref 4.0–10.5)
nRBC: 0 % (ref 0.0–0.2)

## 2018-09-29 LAB — COMPREHENSIVE METABOLIC PANEL
ALT: 16 U/L (ref 0–44)
AST: 27 U/L (ref 15–41)
Albumin: 3.8 g/dL (ref 3.5–5.0)
Alkaline Phosphatase: 120 U/L (ref 38–126)
Anion gap: 10 (ref 5–15)
BUN: 21 mg/dL (ref 8–23)
CHLORIDE: 102 mmol/L (ref 98–111)
CO2: 23 mmol/L (ref 22–32)
CREATININE: 0.97 mg/dL (ref 0.61–1.24)
Calcium: 9.1 mg/dL (ref 8.9–10.3)
GFR calc Af Amer: >60 mL/min (ref >60)
Glucose, Bld: 132 mg/dL — ABNORMAL HIGH (ref 70–99)
POTASSIUM: 4.8 mmol/L (ref 3.5–5.1)
SODIUM: 135 mmol/L (ref 135–145)
Total Bilirubin: 0.8 mg/dL (ref 0.3–1.2)
Total Protein: 6.9 g/dL (ref 6.5–8.1)

## 2018-09-29 LAB — PHOSPHORUS: PHOSPHORUS: 3.4 mg/dL (ref 2.5–4.6)

## 2018-09-29 LAB — URINE CULTURE

## 2018-09-29 LAB — MAGNESIUM: MAGNESIUM: 2.2 mg/dL (ref 1.7–2.4)

## 2018-09-29 MED ORDER — METRONIDAZOLE IN NACL 5-0.79 MG/ML-% IV SOLN
500.0000 mg | Freq: Three times a day (TID) | INTRAVENOUS | Status: DC
  Administered 2018-09-29 – 2018-10-01 (×7): 500 mg via INTRAVENOUS
  Filled 2018-09-29 (×7): qty 100

## 2018-09-29 MED ORDER — HYDRALAZINE HCL 50 MG PO TABS
100.0000 mg | ORAL_TABLET | Freq: Three times a day (TID) | ORAL | Status: DC
  Administered 2018-09-29 – 2018-10-02 (×8): 100 mg via ORAL
  Filled 2018-09-29 (×8): qty 2

## 2018-09-29 MED ORDER — MORPHINE SULFATE (PF) 2 MG/ML IV SOLN
2.0000 mg | Freq: Once | INTRAVENOUS | Status: AC
  Administered 2018-09-29: 2 mg via INTRAVENOUS
  Filled 2018-09-29: qty 1

## 2018-09-29 MED ORDER — HYDRALAZINE HCL 20 MG/ML IJ SOLN
15.0000 mg | Freq: Once | INTRAMUSCULAR | Status: AC
  Administered 2018-09-29: 15 mg via INTRAVENOUS
  Filled 2018-09-29: qty 1

## 2018-09-29 MED ORDER — HYDRALAZINE HCL 20 MG/ML IJ SOLN: 10.0000 mg | Freq: Four times a day (QID) | INTRAMUSCULAR | Status: DC | PRN

## 2018-09-29 MED ORDER — HYDRALAZINE HCL 20 MG/ML IJ SOLN
10.0000 mg | Freq: Once | INTRAMUSCULAR | Status: AC
  Administered 2018-09-29: 10 mg via INTRAVENOUS
  Filled 2018-09-29: qty 1

## 2018-09-29 NOTE — Progress Notes (Signed)
Pt manual BP 202/106 with complaints of chest tightness and pain on his left side. NP on call notified. EKG obtained. Orders placed and carried out. Will continue to monitor closely.

## 2018-09-29 NOTE — Progress Notes (Signed)
Pt manual BP 190/102, Pt states that his "pain is better but not gone". NP on call notified. Will continue to monitor.

## 2018-09-29 NOTE — Progress Notes (Signed)
PROGRESS NOTE    Joseph Bentley  FWY:[id number] DOB: Jun 04, 1926 DOA: 09/27/2018 PCP: Reymundo Poll, MD   Brief Narrative:  HPI per Dr. Jossie Ng on 09/27/18 Joseph Bentley is a 84 y.o. male with medical history significant of coronary artery disease, seizure disorder, GERD, hypertension, atrial fibrillation on chronic warfarin therapy who presented to the ER with chest pain and shortness of breath.  Patient was doing all right until this evening when he felt substernal chest pain.  Pain is in the epigastric region with some abdominal discomfort.  Denied any cough or fever.  Denied any nausea vomiting or diarrhea.  Patient also denies hematemesis or melena.  Pain was diffuse.  He is hard of hearing and a poor historian.  Work-up was done in the ER showing minimally elevated troponin at 0.03 by CT abdomen and pelvis confirms bilateral pneumonia.  Also other incidental findings.  Patient is being admitted with a diagnosis of community-acquired pneumonia.  His chest pain seems to be atypical.  ED Course: Patient's temperature is 98.4 blood pressure 200/85 his pulse is 70 respiratory rate of 30 oxygen sat 92% on room air.  He has a white count of 5.5 hemoglobin 13.6 and platelet 170.  Sodium 135 potassium 5.0, chloride 98, CO2 26 with BUN 28 and creatinine 1.16.  Troponin 0 0.03 INR 0.94 and glucose 103.  Lactic acid is 1.27.  Urinalysis essentially negative.  CT abdomen and pelvis showed bilateral pleural effusion with infiltrates in the lung bases probably pneumonia or aspiration, distended gallbladder with suggestion of sludge diffuse fatty infiltration of the liver, 4.1 cm infrarenal abdominal aortic aneurysm with high-grade stenosis in the distal left external iliac artery, compression deformities at L1 and L4.  **She is very hard of hearing and denies any complaints at this time however does have some mild abdominal tenderness on palpation the right side.  Cardiology consulted as patient became  bradycardic and started having pauses.  We will hold all further labetalol doses.  His blood pressure well controlled yesterday so he started on IV hydralazine and restarted back on his home dose.  Because of concern for his gallbladder HIDA scan will be ordered and I discussed the case with general surgery PA who recommends that if this is a case patient may need a percutaneous gallbladder drain if it is consistent with acute cholecystitis.  Assessment & Plan:   Principal Problem:   Community acquired pneumonia Active Problems:   Atrial fibrillation ([place])   CAD (coronary artery disease)   GERD (gastroesophageal reflux disease)   Hypertensive urgency   Chest pain  Community-acquired pneumonia versus aspiration pneumonitis -He is afebrile and has no white count -CT of the abdomen pelvis with contrast minimal bilateral pleural effusions with coarse infiltrates in the lung bases likely representing pneumonia or aspiration -Chest x-ray showed low lung volumes and hazy opacities of the lung bases bilaterally which favored atelectasis there is chronic pleural-parenchymal scarring at the right costophrenic angle with elevation the right hemidiaphragm -Chest pain could be pleuritic in nature -Continue with IV ceftriaxone and IV azithromycin -Check blood and sputum culture -Blood cultures are showed no growth at 1 day -Repeat chest x-ray this a.m. showed stable cardiomegaly with mild bilateral pulmonary edema.  Probable mild bibasilar subsegmental atelectasis or small pleural effusions  Chest pain rule out ACS/history of CAD -Troponins have been flat at 0.03 and seems atypical -Likely in the setting of pneumonia, A. fib, and likely pleuritic in nature -Cardiology consulted for further evaluation  recommendations -Echocardiogram is being done and showed EF of 60-65% with Normal Wall motion  -Beta-blockers on hold due to bradycardia -Not on aspirin -Cardiology would not pursue further ischemic  evaluation particularly in the patient's age  Atrial fibrillation with atrial flutter with slow ventricular response and Bradycardia with Pauses  -Hold beta-blocker currently -Continue to monitor on telemetry -Has been previously on Coumadin but unclear if he is actually taking -Cardiology consulted for further evaluation and recommending holding labetalol again -Patient is asymptomatic; cardiology feels there is no indication for pacemaker currently -They are recommending avoiding AV nodal blocking agents -Etiology recommending resuming Coumadin for A. fib if mental status improves  Aortic Stenosis -ECHOCardiogram shows moderate stenosis -Cardiology following   Hypertension -Currently holding labetalol -Per cardiology he will likely need an additional antihypertensive medication that does not have AV nodal blocking properties; resume home hydralazine and was placed on IV hydralazine as well -Cardiology recommending following blood pressure and adding as needed.  Patient was given IV hydralazine 10 mg once yesterday  Abdominal Pain -Patient has gallbladder distention and will obtain a right upper quadrant ultrasound to rule out cholecystitis -CT scan showed distended gallbladder with suggestion of layering sludge but no stones or wall thickening and there is diffuse fatty infiltration of the liver -Ultrasound showed gallbladder sludge and small amount of pericholecystic fluid without other evidence of acute cholecystitis -There is a small amount perihepatic ascites -Discussed with general surgery PA about possibly evaluating and they recommend obtaining a HIDA scan and if HIDA is positive then general surgery consult -HIDA ordered and currently pending  Infrarenal abdominal aortic aneurysm -Was 4.1 cm and focal high-grade stenosis in the distal left external iliac artery noted -We will need outpatient follow-up with vascular surgery and cardiology  Compression fractures of the  spine -It was an incidental finding involves L1 and L4 -Patient asymptomatic -Continue to monitor  GERD -This will cause of patient's abdominal pain and epigastric pain.  Continue with PPIs with pantoprazole 40 mill grams p.o. twice daily  History of epilepsy and history of grand mal seizure -Last seizure was about 5 years ago -Continue with levetiracetam home dose at 750 mg po BID  DVT prophylaxis: SCD's; Has been on Coumadin in the past but currently being held? Code Status: Listed as Full Code in Chart but has DNR Paperwork at Home. Remains DNR as patient confirmed it today  Family Communication: No family present at bedside  Disposition Plan: Remain Inpatient for further workup and treatment  Consultants:   Cardiology Dr. Stanford Breed   Procedures:  ECHOCARDIOGRAM ------------------------------------------------------------------- Study Conclusions  - Left ventricle: The cavity size was normal. There was mild   concentric hypertrophy. Systolic function was normal. The   estimated ejection fraction was in the range of 60% to 65%. Wall   motion was normal; there were no regional wall motion   abnormalities. - Ventricular septum: The contour showed systolic flattening   consistent with right ventricular pressure overload. - Aortic valve: Valve mobility was restricted. There was moderate   stenosis. There was mild regurgitation. Peak velocity (S): 359   cm/s. Mean gradient (S): 29 mm Hg. Valve area (VTI): 0.44 cm^2.   Valve area (Vmax): 0.48 cm^2. Valve area (Vmean): 0.46 cm^2. - Aorta: Ascending aortic diameter: 38 mm (S). - Ascending aorta: The ascending aorta was mildly dilated. - Mitral valve: Transvalvular velocity was within the normal range.   There was no evidence for stenosis. There was mild to moderate   regurgitation. Valve area  by continuity equation (using LVOT   flow): 1.25 cm^2. - Left atrium: The atrium was severely dilated. - Right ventricle: The cavity size  was mildly dilated. Wall   thickness was normal. Systolic function was mildly reduced. - Right atrium: The atrium was severely dilated. - Atrial septum: No defect or patent foramen ovale was identified   by color flow Doppler. - Tricuspid valve: There was moderate regurgitation. - Pulmonary arteries: Systolic pressure was severely increased. PA   peak pressure: 82 mm Hg (S).  Impressions:  - Compared with the echo 01/08/13, PASP has increased from 55 mmHg   to 82 mmHg.   Antimicrobials:  Anti-infectives (From admission, onward)   Start     Dose/Rate Route Frequency Ordered Stop   09/29/18 1000  metroNIDAZOLE (FLAGYL) IVPB 500 mg     500 mg 100 mL/hr over 60 Minutes Intravenous Every 8 hours 09/29/18 0939     09/28/18 0103  cefTRIAXone (ROCEPHIN) 1 g in sodium chloride 0.9 % 100 mL IVPB  Status:  Discontinued     1 g 200 mL/hr over 30 Minutes Intravenous Every 24 hours 09/28/18 0104 09/28/18 0120   09/28/18 0103  azithromycin (ZITHROMAX) 500 mg in sodium chloride 0.9 % 250 mL IVPB  Status:  Discontinued     500 mg 250 mL/hr over 60 Minutes Intravenous Every 24 hours 09/28/18 0104 09/28/18 0120   09/27/18 2215  cefTRIAXone (ROCEPHIN) 2 g in sodium chloride 0.9 % 100 mL IVPB     2 g 200 mL/hr over 30 Minutes Intravenous Every 24 hours 09/27/18 2209     09/27/18 2215  azithromycin (ZITHROMAX) 500 mg in sodium chloride 0.9 % 250 mL IVPB     500 mg 250 mL/hr over 60 Minutes Intravenous Every 24 hours 09/27/18 2209       Subjective: Seen and examined at bedside and he was very confused this morning.  Thought he was sitting in a wheelchair though he was in his bed and states that he wanted to get back into bed and rest.  No chest pain but did have some abdominal pain no lightheadedness or dizziness.  Was agitated earlier on.  No other concerns or complaints at this time  Objective: Vitals:   09/29/18 1251 09/29/18 1900 09/29/18 2004 09/29/18 2006  BP:  (!) 167/92 (!) 153/86 (!)  153/86  Pulse:    69  Resp:    (!) 28  Temp:      TempSrc:      SpO2:    95%  Weight:      Height: 5\' 10"  (1.778 m)       Intake/Output Summary (Last 24 hours) at 09/29/2018 2031 Last data filed at 09/29/2018 1901 Gross per 24 hour  Intake 2016.48 ml  Output 500 ml  Net 1516.48 ml   Filed Weights   09/28/18 0453  Weight: 59.2 kg   Examination: Physical Exam:  Constitutional: She is a thin, frail, elderly Caucasian male who is currently very agitated and wanting to rest even though he is in bed Eyes: Sclera anicteric.  Lids and conjunctive are normal ENMT: External ears and nose appear normal.  Extremely hard of hearing Neck: Appears supple with no JVD Respiratory: Diminished to auscultation bilaterally with no appreciable wheezing, rales but does have some coarse breath sounds.  No accessory muscle usage Cardiovascular: Irregularly irregular and normal rate today.  Has a 3/6 systolic murmur Abdomen: Soft, tender to palpate.  Nondistended.  Bowel sounds present in 4 quadrants  GU: Deferred Musculoskeletal: No contractures or cyanosis. Skin: Skin is warm and dry with no appreciable rash or lesions limited skin evaluation Neurologic: Cranial nerves II through XII grossly intact no appreciable focal deficits Psychiatric: Impaired judgment and insight.  Patient is awake and alert but not oriented.  Agitated mood  Data Reviewed: I have personally reviewed following labs and imaging studies  CBC: Recent Labs  Lab 09/27/18 1952 09/28/18 0511 09/29/18 0447  WBC 5.5 5.0 5.5  NEUTROABS 3.8  --  4.3  HGB 13.6 12.3* 13.6  HCT 42.3 38.9* 41.8  MCV 96.4 97.3 95.0  PLT 170 148* 166   Basic Metabolic Panel: Recent Labs  Lab 09/27/18 1952 09/28/18 0511 09/28/18 0916 09/29/18 0447  NA 135 131*  --  135  K 5.0 4.7  --  4.8  CL 98 98  --  102  CO2 26 24  --  23  GLUCOSE 103* 110*  --  132*  BUN 28* 23  --  21  CREATININE 1.16 1.09  --  0.97  CALCIUM 9.2 8.5*  --  9.1  MG   --   --  2.1 2.2  PHOS  --   --  3.4 3.4   GFR: Estimated Creatinine Clearance: 42.4 mL/min (by C-G formula based on SCr of 0.97 mg/dL). Liver Function Tests: Recent Labs  Lab 09/27/18 1952 09/28/18 0511 09/29/18 0447  AST 31 27 27   ALT 16 16 16   ALKPHOS 129* 99 120  BILITOT 0.9 0.8 0.8  PROT 7.5 6.1* 6.9  ALBUMIN 4.0 3.3* 3.8   Recent Labs  Lab 09/27/18 1952  LIPASE 35   No results for input(s): AMMONIA in the last 168 hours. Coagulation Profile: Recent Labs  Lab 09/27/18 1952  INR 0.94   Cardiac Enzymes: Recent Labs  Lab 09/27/18 1952 09/28/18 0916 09/28/18 1453 09/28/18 2054  TROPONINI 0.03* 0.03* 0.03* 0.03*   BNP (last 3 results) No results for input(s): PROBNP in the last 8760 hours. HbA1C: No results for input(s): HGBA1C in the last 72 hours. CBG: No results for input(s): GLUCAP in the last 168 hours. Lipid Profile: No results for input(s): CHOL, HDL, LDLCALC, TRIG, CHOLHDL, LDLDIRECT in the last 72 hours. Thyroid Function Tests: No results for input(s): TSH, T4TOTAL, FREET4, T3FREE, THYROIDAB in the last 72 hours. Anemia Panel: No results for input(s): VITAMINB12, FOLATE, FERRITIN, TIBC, IRON, RETICCTPCT in the last 72 hours. Sepsis Labs: Recent Labs  Lab 09/27/18 2000  LATICACIDVEN 1.27    Recent Results (from the past 240 hour(s))  Urine Culture     Status: Abnormal   Collection Time: 09/27/18  7:32 PM  Result Value Ref Range Status   Specimen Description   Final    URINE, RANDOM Performed at [facility] [address], [place], [place] [address]    Special Requests   Final    NONE Performed at [facility], [place] [address], [place], [place] [address]    Culture (A)  Final    <10,000 COLONIES/mL INSIGNIFICANT GROWTH Performed at [facility] [address], [place], [place] [address]    Report Status 09/29/2018 FINAL  Final  Culture, blood (routine x 2) Call MD if unable to obtain prior  to antibiotics being given     Status: None (Preliminary result)   Collection Time: 09/28/18  1:44 AM  Result Value Ref Range Status   Specimen Description   Final    BLOOD LEFT ANTECUBITAL Performed at [facility]  [place] [address], [place], [place] [address]    Special Requests   Final    BOTTLES DRAWN AEROBIC AND ANAEROBIC Blood Culture adequate volume Performed at [facility] [address], [place], [place] [address]    Culture   Final    NO GROWTH 1 DAY Performed at [facility], [place] [address], [place], [place] [address]    Report Status PENDING  Incomplete  Culture, blood (routine x 2) Call MD if unable to obtain prior to antibiotics being given     Status: None (Preliminary result)   Collection Time: 09/28/18  1:44 AM  Result Value Ref Range Status   Specimen Description   Final    BLOOD LEFT ARM Performed at [facility] [address] [place], [place] [address]    Special Requests   Final    BOTTLES DRAWN AEROBIC ONLY Blood Culture adequate volume Performed at [facility] [address], [place], [place] [address]    Culture   Final    NO GROWTH 1 DAY Performed at [facility], [place] [address], [place], [place] [address]    Report Status PENDING  Incomplete     Radiology Studies: Ct Abdomen Pelvis W Contrast  Result Date: 09/27/2018 CLINICAL DATA:  Weaker than normal with generalized abdominal pain and back pain. EXAM: CT ABDOMEN AND PELVIS WITH CONTRAST TECHNIQUE: Multidetector CT imaging of the abdomen and pelvis was performed using the standard protocol following bolus administration of intravenous contrast. CONTRAST:  79mL OMNIPAQUE IOHEXOL 300 MG/ML  SOLN COMPARISON:  CT pelvis 04/03/2016 FINDINGS: Lower chest: Minimal bilateral effusions with coarse infiltrates in the lung bases possibly representing pneumonia or aspiration. Cardiac enlargement. Postoperative changes in  the mediastinum consistent with coronary artery bypass. Hepatobiliary: The gallbladder is distended with suggestion of mild layering sludge. No discrete stones or wall thickening. Bile ducts are not dilated. Mild diffuse fatty infiltration of the liver without focal lesion. Pancreas: Unremarkable. No pancreatic ductal dilatation or surrounding inflammatory changes. Spleen: Normal in size without focal abnormality. Adrenals/Urinary Tract: No adrenal gland nodules. Asymmetrical left renal atrophy suggesting possibility of chronic renal vascular disease. Calcification in the origins of both renal arteries. Renal nephrograms are symmetrical and homogeneous. No hydronephrosis or hydroureter. Bladder is unremarkable. Stomach/Bowel: Stomach and small bowel are decompressed. Scattered stool throughout the colon without colonic distention. No wall thickening is appreciated. There is mild infiltration in the presacral fat which may represent inflammatory process such as proctitis. No discrete abscess. Appendix is normal. Vascular/Lymphatic: Diffuse aortic calcification. Infrarenal abdominal aortic aneurysm measuring 4.1 cm maximal AP diameter. Suggestion of focal high-grade stenosis in the distal left external iliac artery with reconstitution of flow distally. No significant lymphadenopathy. Reproductive: Prostate is unremarkable. Other: No free air or free fluid in the abdomen. Abdominal wall musculature appears intact. Musculoskeletal: Diffuse bone demineralization. Old appearing compression deformities at L1 and L4 with kyphoplasty at L4. Changes likely represent osteoporosis. IMPRESSION: 1. Minimal bilateral pleural effusions with coarse infiltrates in the lung bases possibly representing pneumonia or aspiration. 2. Distended gallbladder with suggestion of layering sludge. No stones or wall thickening. 3. Diffuse fatty infiltration of the liver. 4. 4.1 cm infrarenal abdominal aortic aneurysm. Focal high-grade stenosis in  the distal left external iliac artery. 5. No evidence of bowel obstruction. Mild infiltration in the presacral fat may represent inflammatory process such as proctitis. 6. Asymmetrical left renal atrophy suggesting chronic renal vascular disease. 7. Osteoporotic  changes in the spine with compression deformities at L1 and L4. Electronically Signed   By: Lucienne Capers M.D.   On: 09/27/2018 21:39   US Abdomen Limited  Result Date: 09/28/2018 CLINICAL DATA:  Abdominal pain EXAM: ULTRASOUND ABDOMEN LIMITED RIGHT UPPER QUADRANT COMPARISON:  CT abdomen pelvis 09/27/2018 FINDINGS: Gallbladder: There is gallbladder sludge and small amount of pericholecystic fluid. A negative sonographic Percell Miller sign was reported by the sonographer. No wall thickening. Common bile duct: Diameter: 4 mm Liver: No focal lesion identified. Within normal limits in parenchymal echogenicity. Portal vein is patent on color Doppler imaging with normal direction of blood flow towards the liver. There is free fluid adjacent to the liver. IMPRESSION: 1. Gallbladder sludge and small amount of pericholecystic fluid without other evidence of acute cholecystitis. 2. Small volume perihepatic ascites. Electronically Signed   By: Ulyses Jarred M.D.   On: 09/28/2018 03:22   Dg Chest Port 1 View  Result Date: 09/29/2018 CLINICAL DATA:  Shortness of breath. EXAM: PORTABLE CHEST 1 VIEW COMPARISON:  Radiographs of September 27, 2018. FINDINGS: Stable cardiomegaly. Status post coronary artery bypass graft. Atherosclerosis of abdominal aorta is noted. Mild bilateral pulmonary edema may be present. Mild bibasilar subsegmental atelectasis is noted with possible small pleural effusions. Bony thorax is unremarkable. IMPRESSION: Stable cardiomegaly with mild bilateral pulmonary edema. Probable mild bibasilar subsegmental atelectasis or small pleural effusions. Aortic Atherosclerosis (ICD10-I70.0). Electronically Signed   By: Marijo Conception, M.D.   On: 09/29/2018  08:33   Scheduled Meds: . feeding supplement (ENSURE ENLIVE)  237 mL Oral BID BM  . hydrALAZINE  100 mg Oral Q8H  . levETIRAcetam  750 mg Oral BID  . pantoprazole  40 mg Oral BID  . polyethylene glycol  17 g Oral BID   Continuous Infusions: . sodium chloride 50 mL/hr at 09/29/18 0600  . azithromycin Stopped (09/28/18 2326)  . cefTRIAXone (ROCEPHIN)  IV 2 g (09/29/18 2008)  . metronidazole 500 mg (09/29/18 1726)    LOS: 1 day   Kerney Elbe, DO [facility] PAGER is on [place]  If 7PM-7AM, please contact night-coverage [email or web address] Password [email or web address] 09/29/2018, 8:31 PM

## 2018-09-29 NOTE — Plan of Care (Signed)
Patient without complaint of abdominal pain, chest pain or other on 7 a to 7 p shift, tolerating heart healthy diet.  Patient up to recliner with 2 max assist. Son at bedside for part of shift.

## 2018-09-29 NOTE — Progress Notes (Signed)
Initial Nutrition Assessment  DOCUMENTATION CODES:   Not applicable  INTERVENTION:   Ensure Enlive po BID, each supplement provides 350 kcal and 20 grams of protein  NUTRITION DIAGNOSIS:   Inadequate oral intake related to lethargy/confusion as evidenced by meal completion < 25%.  GOAL:   Patient will meet greater than or equal to 90% of their needs  MONITOR:   PO intake, Supplement acceptance, Labs, Weight trends, I & O's  REASON FOR ASSESSMENT:   Malnutrition Screening Tool    ASSESSMENT:   Patient with PMH significant for CAD, CHF,hearing difficulty, HLD, stroke, seizure disorder, GERD, HTN, and A Fib. Presents this admission with CAP and chest pain.    Pt confused this morning asking RD if he can eat in the dining room. Not oriented to place and was unable to provide history. No family at bedside. Meal completions look to be inconsistent. Pt consumed 80% of his breakfast yesterday but other meals charted as 0%. Spoke with RN who reports pt refused breakfast this morning but drank 2 glasses of orange juice. RD assisted pt with Ensure, he seemed to enjoy the taste. Will continue with these and attempt to speak with family if possible.   Pt unable to provide wt history. Records indicate pt weighed 143 lb on 10/10/17 and 131 lb this admission (8.4% wt loss in one year, insignificant loss for time frame). Suspect some form of malnutrition but unable to diagnose withotu supporting evidence. Nutrition-Focused physical exam completed.   Medications reviewed and include: miralax, NS @ 50 ml/hr Labs reviewed.   NUTRITION - FOCUSED PHYSICAL EXAM:    Most Recent Value  Orbital Region  No depletion  Upper Arm Region  Moderate depletion  Thoracic and Lumbar Region  Unable to assess  Buccal Region  No depletion  Temple Region  Mild depletion  Clavicle Bone Region  Mild depletion  Clavicle and Acromion Bone Region  Moderate depletion  Scapular Bone Region  Unable to assess   Dorsal Hand  Moderate depletion  Patellar Region  Severe depletion  Anterior Thigh Region  Severe depletion  Posterior Calf Region  Severe depletion  Edema (RD Assessment)  None     Diet Order:   Diet Order            Diet Heart Room service appropriate? Yes; Fluid consistency: Thin  Diet effective now              EDUCATION NEEDS:   Not appropriate for education at this time  Skin:  Skin Assessment: Reviewed RN Assessment  Last BM:  09/26/18  Height:   Ht Readings from Last 1 Encounters:  09/29/18 5\' 10"  (1.778 m)    Weight:   Wt Readings from Last 1 Encounters:  09/28/18 59.2 kg    Ideal Body Weight:  75.5 kg  BMI:  Body mass index is 18.73 kg/m.  Estimated Nutritional Needs:   Kcal:  1750-1950 kcal  Protein:  85-100 grams  Fluid:  >/= 1.7 L/day  Mariana Single RD, LDN Clinical Nutrition Pager # - [phone]

## 2018-09-29 NOTE — Progress Notes (Addendum)
Progress Note  Patient Name: Joseph Bentley Date of Encounter: 09/29/2018  Primary Cardiologist: Peter Martinique, MD   Subjective   Resting comfortably but confused this morning. Not oriented to place. No current pain.   Inpatient Medications    Scheduled Meds: . feeding supplement (ENSURE ENLIVE)  237 mL Oral BID BM  . Influenza vac split quadrivalent PF  0.5 mL Intramuscular Tomorrow-1000  . levETIRAcetam  750 mg Oral BID  . pantoprazole  40 mg Oral BID  . polyethylene glycol  17 g Oral BID   Continuous Infusions: . sodium chloride 50 mL/hr at 09/29/18 0600  . azithromycin Stopped (09/28/18 2326)  . cefTRIAXone (ROCEPHIN)  IV Stopped (09/28/18 2149)   PRN Meds: hydrALAZINE   Vital Signs    Vitals:   09/29/18 0524 09/29/18 0621 09/29/18 0656 09/29/18 0758  BP: (!) 177/99 (!) 190/102 (!) 201/91 (!) 162/78  Pulse:   81   Resp: (!) 26  (!) 25   Temp:      TempSrc:      SpO2: 98%  94%   Weight:        Intake/Output Summary (Last 24 hours) at 09/29/2018 0826 Last data filed at 09/29/2018 0600 Gross per 24 hour  Intake 2133.19 ml  Output 925 ml  Net 1208.19 ml   Filed Weights   09/28/18 0453  Weight: 59.2 kg    Telemetry    Afib 90s. No recurrent bradycardia or pauses in the last 24 hrs - Personally Reviewed  ECG    afib 90s - Personally Reviewed  Physical Exam   GEN: elderly WM, confused but in No acute distress.   Neck: No JVD Cardiac: irregularly irregular rhythm, regular rate, murmur noted at RUSB and apex Respiratory: Clear to auscultation bilaterally. GI: Soft, nontender, non-distended  MS: No edema; No deformity. Neuro: confused  Psych: confused, ? Dementia  Labs    Chemistry Recent Labs  Lab 09/27/18 1952 09/28/18 0511 09/29/18 0447  NA 135 131* 135  K 5.0 4.7 4.8  CL 98 98 102  CO2 26 24 23   GLUCOSE 103* 110* 132*  BUN 28* 23 21  CREATININE 1.16 1.09 0.97  CALCIUM 9.2 8.5* 9.1  PROT 7.5 6.1* 6.9  ALBUMIN 4.0 3.3* 3.8  AST  31 27 27   ALT 16 16 16   ALKPHOS 129* 99 120  BILITOT 0.9 0.8 0.8  GFRNONAA 54* 58* >60  GFRAA >60 >60 >60  ANIONGAP 11 9 10      Hematology Recent Labs  Lab 09/27/18 1952 09/28/18 0511 09/29/18 0447  WBC 5.5 5.0 5.5  RBC 4.39 4.00* 4.40  HGB 13.6 12.3* 13.6  HCT 42.3 38.9* 41.8  MCV 96.4 97.3 95.0  MCH 31.0 30.8 30.9  MCHC 32.2 31.6 32.5  RDW 14.0 14.0 13.7  PLT 170 148* 166    Cardiac Enzymes Recent Labs  Lab 09/27/18 1952 09/28/18 0916 09/28/18 1453 09/28/18 2054  TROPONINI 0.03* 0.03* 0.03* 0.03*   No results for input(s): TROPIPOC in the last 168 hours.   BNPNo results for input(s): BNP, PROBNP in the last 168 hours.   DDimer No results for input(s): DDIMER in the last 168 hours.   Radiology    Dg Chest 2 View  Result Date: 09/27/2018 CLINICAL DATA:  Dyspnea EXAM: CHEST - 2 VIEW COMPARISON:  07/14/2018 chest radiograph. FINDINGS: Low lung volumes. Intact sternotomy wires. Stable cardiomediastinal silhouette with mild cardiomegaly. No pneumothorax. Stable chronic blunting of the right costophrenic angle with elevation of the  right hemidiaphragm. No convincing pleural effusions. Borderline mild pulmonary edema. Hazy opacities at the lung bases bilaterally. IMPRESSION: 1. Low lung volumes. Hazy opacities at the lung bases bilaterally, favor atelectasis. Chronic pleural-parenchymal scarring at the right costophrenic angle with elevation of the right hemidiaphragm. 2. Stable cardiomegaly with borderline mild pulmonary edema. Electronically Signed   By: Ilona Sorrel M.D.   On: 09/27/2018 19:53   Ct Abdomen Pelvis W Contrast  Result Date: 09/27/2018 CLINICAL DATA:  Weaker than normal with generalized abdominal pain and back pain. EXAM: CT ABDOMEN AND PELVIS WITH CONTRAST TECHNIQUE: Multidetector CT imaging of the abdomen and pelvis was performed using the standard protocol following bolus administration of intravenous contrast. CONTRAST:  106mL OMNIPAQUE IOHEXOL 300 MG/ML   SOLN COMPARISON:  CT pelvis 04/03/2016 FINDINGS: Lower chest: Minimal bilateral effusions with coarse infiltrates in the lung bases possibly representing pneumonia or aspiration. Cardiac enlargement. Postoperative changes in the mediastinum consistent with coronary artery bypass. Hepatobiliary: The gallbladder is distended with suggestion of mild layering sludge. No discrete stones or wall thickening. Bile ducts are not dilated. Mild diffuse fatty infiltration of the liver without focal lesion. Pancreas: Unremarkable. No pancreatic ductal dilatation or surrounding inflammatory changes. Spleen: Normal in size without focal abnormality. Adrenals/Urinary Tract: No adrenal gland nodules. Asymmetrical left renal atrophy suggesting possibility of chronic renal vascular disease. Calcification in the origins of both renal arteries. Renal nephrograms are symmetrical and homogeneous. No hydronephrosis or hydroureter. Bladder is unremarkable. Stomach/Bowel: Stomach and small bowel are decompressed. Scattered stool throughout the colon without colonic distention. No wall thickening is appreciated. There is mild infiltration in the presacral fat which may represent inflammatory process such as proctitis. No discrete abscess. Appendix is normal. Vascular/Lymphatic: Diffuse aortic calcification. Infrarenal abdominal aortic aneurysm measuring 4.1 cm maximal AP diameter. Suggestion of focal high-grade stenosis in the distal left external iliac artery with reconstitution of flow distally. No significant lymphadenopathy. Reproductive: Prostate is unremarkable. Other: No free air or free fluid in the abdomen. Abdominal wall musculature appears intact. Musculoskeletal: Diffuse bone demineralization. Old appearing compression deformities at L1 and L4 with kyphoplasty at L4. Changes likely represent osteoporosis. IMPRESSION: 1. Minimal bilateral pleural effusions with coarse infiltrates in the lung bases possibly representing pneumonia  or aspiration. 2. Distended gallbladder with suggestion of layering sludge. No stones or wall thickening. 3. Diffuse fatty infiltration of the liver. 4. 4.1 cm infrarenal abdominal aortic aneurysm. Focal high-grade stenosis in the distal left external iliac artery. 5. No evidence of bowel obstruction. Mild infiltration in the presacral fat may represent inflammatory process such as proctitis. 6. Asymmetrical left renal atrophy suggesting chronic renal vascular disease. 7. Osteoporotic changes in the spine with compression deformities at L1 and L4. Electronically Signed   By: Lucienne Capers M.D.   On: 09/27/2018 21:39   US Abdomen Limited  Result Date: 09/28/2018 CLINICAL DATA:  Abdominal pain EXAM: ULTRASOUND ABDOMEN LIMITED RIGHT UPPER QUADRANT COMPARISON:  CT abdomen pelvis 09/27/2018 FINDINGS: Gallbladder: There is gallbladder sludge and small amount of pericholecystic fluid. A negative sonographic Percell Miller sign was reported by the sonographer. No wall thickening. Common bile duct: Diameter: 4 mm Liver: No focal lesion identified. Within normal limits in parenchymal echogenicity. Portal vein is patent on color Doppler imaging with normal direction of blood flow towards the liver. There is free fluid adjacent to the liver. IMPRESSION: 1. Gallbladder sludge and small amount of pericholecystic fluid without other evidence of acute cholecystitis. 2. Small volume perihepatic ascites. Electronically Signed   By: Lennette Bihari  Collins [place] M.D.   On: 09/28/2018 03:22    Cardiac Studies   2D Echo 09/28/18 Study Conclusions  - Left ventricle: The cavity size was normal. There was mild   concentric hypertrophy. Systolic function was normal. The   estimated ejection fraction was in the range of 60% to 65%. Wall   motion was normal; there were no regional wall motion   abnormalities. - Ventricular septum: The contour showed systolic flattening   consistent with right ventricular pressure overload. - Aortic valve:  Valve mobility was restricted. There was moderate   stenosis. There was mild regurgitation. Peak velocity (S): 359   cm/s. Mean gradient (S): 29 mm Hg. Valve area (VTI): 0.44 cm^2.   Valve area (Vmax): 0.48 cm^2. Valve area (Vmean): 0.46 cm^2. - Aorta: Ascending aortic diameter: 38 mm (S). - Ascending aorta: The ascending aorta was mildly dilated. - Mitral valve: Transvalvular velocity was within the normal range.   There was no evidence for stenosis. There was mild to moderate   regurgitation. Valve area by continuity equation (using LVOT   flow): 1.25 cm^2. - Left atrium: The atrium was severely dilated. - Right ventricle: The cavity size was mildly dilated. Wall   thickness was normal. Systolic function was mildly reduced. - Right atrium: The atrium was severely dilated. - Atrial septum: No defect or patent foramen ovale was identified   by color flow Doppler. - Tricuspid valve: There was moderate regurgitation. - Pulmonary arteries: Systolic pressure was severely increased. PA   peak pressure: 82 mm Hg (S).  Impressions:  - Compared with the echo 01/08/13, PASP has increased from 55 mmHg   to 82 mmHg.  Patient Profile     RENA SWEEDEN is a 84 y.o. male with a hx of CAD s/p CABG, mild AS/AI and MR on echo in 2014, chronic afib on chronic anticoagulation w/ coumadin, HTN, h/o CVA and seizure disorder, admitted for CAP, who is being seen today for the evaluation of bradycardia at the request of Dr. Alfredia Ferguson, Internal Medicine.   Assessment & Plan   1. Bradycardia:  Resolved after  blocker washout. He had a 5 sec pause yesterday, in the setting of labetalol but no recurrent pauses or significant bradycardia in the last 24 hrs. Continue to avoid AV nodal blocking agents.   2. Chest Pain: pt noted recent CP on admit, but as outlined above pt is unable to provide detailed history. He is 84 y/o, hard of hearing and ? Some underlying dementia. Troponins have been cycled x 3 and flat at  0.03>>0.03>>0.03. Enzyme elevation is not c/w ACS. Echo also done yesterday and showed normal LVEF and normal wall motion. There has been slight diease progression of his  Aortic stenosis but now only moderate. No indication for further ischemic w/u. Continue medical management.   3. Aortic Stenosis: slight disease progression since last study in 2014. AS is now moderate. Mean gradient 29 mmHg. EF normal. Dr. Martinique can continue to follow outpatient.    4. Mitral Regurgitation: mild- mod MR noted on echo yesterday.   5. CAD: h/o CABG in 2001. LHC in 2008 showed patent grafts. Recent CP and dyspnea with flat, low level troponin level not c/w ACS. Echo shows normal LVEF and wall motion. Given advanced age, would continue medical management for now. No plans for invasive therapies. His ? blocker has been discontinued. He is not currently on ASA nor statin. Last LDL in 2017 was 135. Will defer to MD.   6. Afib/flutter:  CVR. ? blocker discontinued due to profound bradycardia. HRs on tele in the 80s-90s. Would continue to avoid AV nodal blocking agents.  Was on coumadin as outpatient but currently not ordered. ??.    7. HTN: BPs elevated since labetalol was discontinued. He has been getting IV hydralazine. He was on po hydralazine as outpatient 100 mg TID. Recommend resuming and monitor BP response.   For questions or updates, please contact [place] Please consult [email or web address] for contact info under        Signed, Lyda Jester, PA-C  09/29/2018, 8:26 AM    As above, patient seen and examined.  Patient is confused today.  Thinks he is in the dining room.  His heart rate has improved after discontinuing labetalol.  No indication for pacemaker.  His blood pressure is elevated.  Would resume hydralazine and follow.  Adjust regimen as needed.  Could add amlodipine if needed.  Avoid AV nodal blocking agents.  There was question of chest pain at time of admission but seemed more consistent  with abdominal pain.  Enzymes are negative.  Echocardiogram shows normal LV function.  Would not pursue further ischemia evaluation.  Aortic stenosis moderate on repeat study.  Would resume Coumadin for atrial fibrillation if mental status improves.  Other issues per primary care.  [facility] will sign off.   Medication Recommendations: As outlined above would resume hydralazine.  If blood pressure remains elevated add amlodipine.  Avoid AV nodal blocking agents. Other recommendations (labs, testing, etc): No additional recommendations Follow up as an outpatient: Follow-up Dr. Martinique 4 to 6 weeks after discharge.  Kirk Ruths, MD

## 2018-09-30 ENCOUNTER — Inpatient Hospital Stay (HOSPITAL_COMMUNITY): Payer: Medicare Other

## 2018-09-30 DIAGNOSIS — R0602 Shortness of breath: Secondary | ICD-10-CM

## 2018-09-30 DIAGNOSIS — J690 Pneumonitis due to inhalation of food and vomit: Secondary | ICD-10-CM

## 2018-09-30 DIAGNOSIS — J9601 Acute respiratory failure with hypoxia: Secondary | ICD-10-CM

## 2018-09-30 DIAGNOSIS — J189 Pneumonia, unspecified organism: Principal | ICD-10-CM

## 2018-09-30 DIAGNOSIS — R109 Unspecified abdominal pain: Secondary | ICD-10-CM | POA: Diagnosis present

## 2018-09-30 DIAGNOSIS — I714 Abdominal aortic aneurysm, without rupture, unspecified: Secondary | ICD-10-CM | POA: Diagnosis present

## 2018-09-30 DIAGNOSIS — I10 Essential (primary) hypertension: Secondary | ICD-10-CM

## 2018-09-30 DIAGNOSIS — R569 Unspecified convulsions: Secondary | ICD-10-CM

## 2018-09-30 DIAGNOSIS — R071 Chest pain on breathing: Secondary | ICD-10-CM

## 2018-09-30 DIAGNOSIS — R1084 Generalized abdominal pain: Secondary | ICD-10-CM

## 2018-09-30 DIAGNOSIS — K219 Gastro-esophageal reflux disease without esophagitis: Secondary | ICD-10-CM

## 2018-09-30 DIAGNOSIS — I160 Hypertensive urgency: Secondary | ICD-10-CM

## 2018-09-30 LAB — CBC WITH DIFFERENTIAL/PLATELET
Abs Immature Granulocytes: 0.02 10*3/uL (ref 0.00–0.07)
Basophils Absolute: 0 10*3/uL (ref 0.0–0.1)
Basophils Relative: 1 %
EOS ABS: 0.1 10*3/uL (ref 0.0–0.5)
EOS PCT: 2 %
HEMATOCRIT: 37.7 % — AB (ref 39.0–52.0)
HEMOGLOBIN: 11.9 g/dL — AB (ref 13.0–17.0)
Immature Granulocytes: 0 %
LYMPHS ABS: 0.5 10*3/uL — AB (ref 0.7–4.0)
LYMPHS PCT: 8 %
MCH: 30.7 pg (ref 26.0–34.0)
MCHC: 31.6 g/dL (ref 30.0–36.0)
MCV: 97.4 fL (ref 80.0–100.0)
Monocytes Absolute: 0.8 10*3/uL (ref 0.1–1.0)
Monocytes Relative: 14 %
Neutro Abs: 4.4 10*3/uL (ref 1.7–7.7)
Neutrophils Relative %: 75 %
Platelets: 143 10*3/uL — ABNORMAL LOW (ref 150–400)
RBC: 3.87 MIL/uL — AB (ref 4.22–5.81)
RDW: 14 % (ref 11.5–15.5)
WBC: 5.9 10*3/uL (ref 4.0–10.5)
nRBC: 0 % (ref 0.0–0.2)

## 2018-09-30 LAB — COMPREHENSIVE METABOLIC PANEL
ALK PHOS: 89 U/L (ref 38–126)
ALT: 13 U/L (ref 0–44)
ANION GAP: 8 (ref 5–15)
AST: 23 U/L (ref 15–41)
Albumin: 3 g/dL — ABNORMAL LOW (ref 3.5–5.0)
BILIRUBIN TOTAL: 0.7 mg/dL (ref 0.3–1.2)
BUN: 29 mg/dL — ABNORMAL HIGH (ref 8–23)
CALCIUM: 8.7 mg/dL — AB (ref 8.9–10.3)
CO2: 25 mmol/L (ref 22–32)
Chloride: 104 mmol/L (ref 98–111)
Creatinine, Ser: 1.37 mg/dL — ABNORMAL HIGH (ref 0.61–1.24)
GFR calc Af Amer: 51 mL/min — ABNORMAL LOW (ref >60)
GFR calc non Af Amer: 44 mL/min — ABNORMAL LOW (ref >60)
Glucose, Bld: 102 mg/dL — ABNORMAL HIGH (ref 70–99)
Potassium: 4.5 mmol/L (ref 3.5–5.1)
Sodium: 137 mmol/L (ref 135–145)
TOTAL PROTEIN: 5.9 g/dL — AB (ref 6.5–8.1)

## 2018-09-30 LAB — PHOSPHORUS: PHOSPHORUS: 4.2 mg/dL (ref 2.5–4.6)

## 2018-09-30 LAB — MAGNESIUM: MAGNESIUM: 2.2 mg/dL (ref 1.7–2.4)

## 2018-09-30 LAB — GLUCOSE, CAPILLARY: GLUCOSE-CAPILLARY: 139 mg/dL — AB (ref 70–99)

## 2018-09-30 MED ORDER — TECHNETIUM TC 99M MEBROFENIN IV KIT
4.6000 | PACK | Freq: Once | INTRAVENOUS | Status: AC | PRN
  Administered 2018-09-30: 4.6 via INTRAVENOUS

## 2018-09-30 MED ORDER — AMLODIPINE BESYLATE 5 MG PO TABS
5.0000 mg | ORAL_TABLET | Freq: Every day | ORAL | Status: DC
  Administered 2018-09-30: 5 mg via ORAL
  Filled 2018-09-30: qty 1

## 2018-09-30 MED ORDER — CEFDINIR 300 MG PO CAPS
300.0000 mg | ORAL_CAPSULE | Freq: Two times a day (BID) | ORAL | Status: AC
  Administered 2018-09-30 – 2018-10-02 (×4): 300 mg via ORAL
  Filled 2018-09-30 (×4): qty 1

## 2018-09-30 MED ORDER — AMLODIPINE BESYLATE 5 MG PO TABS
5.0000 mg | ORAL_TABLET | Freq: Every day | ORAL | Status: DC
  Administered 2018-10-01: 5 mg via ORAL
  Filled 2018-09-30: qty 1

## 2018-09-30 MED ORDER — AZITHROMYCIN 250 MG PO TABS
250.0000 mg | ORAL_TABLET | Freq: Every day | ORAL | Status: AC
  Administered 2018-10-01 – 2018-10-02 (×2): 250 mg via ORAL
  Filled 2018-09-30 (×2): qty 1

## 2018-09-30 NOTE — Plan of Care (Signed)
  Problem: Clinical Measurements: Goal: Will remain free from infection Outcome: Progressing Goal: Respiratory complications will improve Outcome: Progressing Goal: Cardiovascular complication will be avoided Outcome: Progressing   Problem: Coping: Goal: Level of anxiety will decrease Outcome: Progressing   Problem: Elimination: Goal: Will not experience complications related to urinary retention Outcome: Progressing   Problem: Safety: Goal: Ability to remain free from injury will improve Outcome: Progressing   Carlynn Herald, RN 09/30/18 3:40 AM

## 2018-09-30 NOTE — Progress Notes (Signed)
PROGRESS NOTE  LEVANDER KATZENSTEIN KLK:[id number] DOB: 1926/09/26 DOA: 09/27/2018 PCP: Reymundo Poll, MD  HPI/Brief Narrative  Joseph Bentley is a 84 y.o. year old male with medical history significant for CAD, seizure disorder, hypertension, atrial fibrillation on chronic warfarin who presented on 09/27/2018 with chest and abdominal pain and was found to have acute hypoxic respiratory failure secondary to community-acquired pneumonia.  Completed 3 days of IV azithromycin and ceftriaxone.  Subjective No complaints today  Assessment/Plan:  #Acute hypoxic respiratory failure secondary to either presumed community-acquired pneumonia or aspiration pneumonitis.  Strep pneumo negative, no findings on blood cultures, no RVP obatined. repeat chest x-ray show mild bilateral pulmonary edema with small pleural effusions and some atelectasis.  We will encourage incentive spirometry, flutter valve, de-escalate from IV antibiotics to cefdenir and azithromycin  #Hypertension, not at goal.  Still elevated SBP is 170s.  Likely related to discontinuing home beta-blocker.  Will add amlodipine 5 mg and continue previous home regimen of hydralazine  AKI.  Current creatinine 1.37 previous baseline 0.9-1.2.  Likely prerenal etiology related to diminished p.o. intake as patient was n.p.o. awaiting HIDA scan for today.  Repeat on 10/17 in a.m., 24 hours of gentle hydration.  #Abdominal pain, improving. Distended gallbladder  found on CT abdomen imaging signs are suggestive of layering sludge.  Right upper quadrant ultrasound confirmed gallbladder sludge with no evidence of acute cholecystitis small perihepatic ascites.  HIDA scan obtained on 10/16 showed no biliary stones.  Patient remains without abdominal pain and improved supportive care with Protonix twice daily likely resulting of GERD.  His semiology remain unremarkable  #Atrial fibrillation with atrial letter, slow ventricular response resolved. Cardiology was  consulted and recommended holding patient's labetalol.  Patient is remained asymptomatic no indication for pacemaker.  Will need to continue to avoid AV nodal blocking agents in the future.  Cardiology recommends resuming warfarin once patient becomes more alert.  #.  Monitor aortic stenosis, stable.  Coronary we will continue to follow as outpatient.  #Infrarenal abdominal aortic aneurysm.  4.1 cm AAA on CT abdominal imaging with focal high-grade stenosis in the distal left external iliac artery.  Will need follow-up with vascular surgery and cardiology as outpatient.  #Compression fractures of L1 and L4, incidental finding.  Patient remains asymptomatic  #History of epilepsy and grand mal seizures.  Continue home [place].  Has remained seizure-free during hospital stay.  History of CAD with presumed presenting symptom of chest pain, resolved.  History of CABG in 2001.  Presented with chest pain and dyspnea however only slightly elevated troponin with flat trend.  TTE showed no wall motion normalities.  Cardiology recommends continue medical management with no plan for ischemic evaluation.  Will follow with Dr. Martinique in 4 to 6 weeks after discharge as outpatient  Code Status: DNR  Family Communication: Discussed with son over phone  Disposition Plan: Yes/no IV antibiotics to oral, need a definitive plan for oral anticoagulation, possible home in the next 24 hours   Consultants:  Cardiology  Procedures: HIDA scan  Antimicrobials: Anti-infectives (From admission, onward)   Start     Dose/Rate Route Frequency Ordered Stop   09/29/18 1000  metroNIDAZOLE (FLAGYL) IVPB 500 mg     500 mg 100 mL/hr over 60 Minutes Intravenous Every 8 hours 09/29/18 0939     09/28/18 0103  cefTRIAXone (ROCEPHIN) 1 g in sodium chloride 0.9 % 100 mL IVPB  Status:  Discontinued     1 g 200 mL/hr over 30 Minutes  Intravenous Every 24 hours 09/28/18 0104 09/28/18 0120   09/28/18 0103  azithromycin (ZITHROMAX)  500 mg in sodium chloride 0.9 % 250 mL IVPB  Status:  Discontinued     500 mg 250 mL/hr over 60 Minutes Intravenous Every 24 hours 09/28/18 0104 09/28/18 0120   09/27/18 2215  cefTRIAXone (ROCEPHIN) 2 g in sodium chloride 0.9 % 100 mL IVPB     2 g 200 mL/hr over 30 Minutes Intravenous Every 24 hours 09/27/18 2209     09/27/18 2215  azithromycin (ZITHROMAX) 500 mg in sodium chloride 0.9 % 250 mL IVPB     500 mg 250 mL/hr over 60 Minutes Intravenous Every 24 hours 09/27/18 2209           Cultures:  10/13 blood cultures negative x2  10/13 urine culture less than 10,000 colonies, insignificant growth  Telemetry: Yes  DVT prophylaxis: SCDs   Objective: Vitals:   09/29/18 2006 09/30/18 0459 09/30/18 1055 09/30/18 1327  BP: (!) 153/86 127/75  (!) 171/84  Pulse: 69 69  74  Resp: (!) 28 (!) 22  18  Temp:  98.7 F (37.1 C)  98.4 F (36.9 C)  TempSrc:  Oral  Oral  SpO2: 95% 95% 97% 98%  Weight:      Height:        Intake/Output Summary (Last 24 hours) at 09/30/2018 1735 Last data filed at 09/30/2018 0600 Gross per 24 hour  Intake 1639.69 ml  Output -  Net 1639.69 ml   Filed Weights   09/28/18 0453  Weight: 59.2 kg    Exam: General-elderly male, lying in bed in no distress, very hard of hearing Eyes-EOMI, anicteric sclera ENT-moist oral mucosa CV-regular rate and rhythm, systolic murmur heard throughout chest Respiratory- normal respiratory effort, 2 L nasal cannula, diminished breath sounds throughout, minimal crackles at bases on anterior lung fields Neurologic no gross focal neurologic deficits  Data Reviewed: CBC: Recent Labs  Lab 09/27/18 1952 09/28/18 0511 09/29/18 0447 09/30/18 0417  WBC 5.5 5.0 5.5 5.9  NEUTROABS 3.8  --  4.3 4.4  HGB 13.6 12.3* 13.6 11.9*  HCT 42.3 38.9* 41.8 37.7*  MCV 96.4 97.3 95.0 97.4  PLT 170 148* 166 127*   Basic Metabolic Panel: Recent Labs  Lab 09/27/18 1952 09/28/18 0511 09/28/18 0916 09/29/18 0447  09/30/18 0417  NA 135 131*  --  135 137  K 5.0 4.7  --  4.8 4.5  CL 98 98  --  102 104  CO2 26 24  --  23 25  GLUCOSE 103* 110*  --  132* 102*  BUN 28* 23  --  21 29*  CREATININE 1.16 1.09  --  0.97 1.37*  CALCIUM 9.2 8.5*  --  9.1 8.7*  MG  --   --  2.1 2.2 2.2  PHOS  --   --  3.4 3.4 4.2   GFR: Estimated Creatinine Clearance: 30 mL/min (A) (by C-G formula based on SCr of 1.37 mg/dL (H)). Liver Function Tests: Recent Labs  Lab 09/27/18 1952 09/28/18 0511 09/29/18 0447 09/30/18 0417  AST 31 27 27 23   ALT 16 16 16 13   ALKPHOS 129* 99 120 89  BILITOT 0.9 0.8 0.8 0.7  PROT 7.5 6.1* 6.9 5.9*  ALBUMIN 4.0 3.3* 3.8 3.0*   Recent Labs  Lab 09/27/18 1952  LIPASE 35   No results for input(s): AMMONIA in the last 168 hours. Coagulation Profile: Recent Labs  Lab 09/27/18 1952  INR 0.94  Cardiac Enzymes: Recent Labs  Lab 09/27/18 1952 09/28/18 0916 09/28/18 1453 09/28/18 2054  TROPONINI 0.03* 0.03* 0.03* 0.03*   BNP (last 3 results) No results for input(s): PROBNP in the last 8760 hours. HbA1C: No results for input(s): HGBA1C in the last 72 hours. CBG: Recent Labs  Lab 09/29/18 2134  GLUCAP 139*   Lipid Profile: No results for input(s): CHOL, HDL, LDLCALC, TRIG, CHOLHDL, LDLDIRECT in the last 72 hours. Thyroid Function Tests: No results for input(s): TSH, T4TOTAL, FREET4, T3FREE, THYROIDAB in the last 72 hours. Anemia Panel: No results for input(s): VITAMINB12, FOLATE, FERRITIN, TIBC, IRON, RETICCTPCT in the last 72 hours. Urine analysis:    Component Value Date/Time   COLORURINE YELLOW 09/27/2018 1932   APPEARANCEUR CLEAR 09/27/2018 1932   LABSPEC 1.014 09/27/2018 1932   PHURINE 7.0 09/27/2018 1932   GLUCOSEU NEGATIVE 09/27/2018 1932   GLUCOSEU NEGATIVE 03/04/2014 1515   HGBUR NEGATIVE 09/27/2018 1932   BILIRUBINUR NEGATIVE 09/27/2018 1932   KETONESUR NEGATIVE 09/27/2018 1932   PROTEINUR 30 (A) 09/27/2018 1932   UROBILINOGEN 0.2 03/04/2014 1515    NITRITE NEGATIVE 09/27/2018 [place] 09/27/2018 1932   Sepsis Labs: @LABRCNTIP (procalcitonin:4,lacticidven:4)  ) Recent Results (from the past 240 hour(s))  Urine Culture     Status: Abnormal   Collection Time: 09/27/18  7:32 PM  Result Value Ref Range Status   Specimen Description   Final    URINE, RANDOM Performed at [facility], [place] [address], [place], [place] [address]    Special Requests   Final    NONE Performed at [facility], [place] [address], [place], [place] [address]    Culture (A)  Final    <10,000 COLONIES/mL INSIGNIFICANT GROWTH Performed at [place] [address], [place], [place] [address]    Report Status 09/29/2018 FINAL  Final  Culture, blood (routine x 2) Call MD if unable to obtain prior to antibiotics being given     Status: None (Preliminary result)   Collection Time: 09/28/18  1:44 AM  Result Value Ref Range Status   Specimen Description   Final    BLOOD LEFT ANTECUBITAL Performed at [place] [address], [place], [place] [address]    Special Requests   Final    BOTTLES DRAWN AEROBIC AND ANAEROBIC Blood Culture adequate volume Performed at [place] [address], [place], [place] [address]    Culture   Final    NO GROWTH 2 DAYS Performed at [place] [address], [place], [place] [address]    Report Status PENDING  Incomplete  Culture, blood (routine x 2) Call MD if unable to obtain prior to antibiotics being given     Status: None (Preliminary result)   Collection Time: 09/28/18  1:44 AM  Result Value Ref Range Status   Specimen Description   Final    BLOOD LEFT ARM Performed at [place] [address] [place], [place] [address]    Special Requests   Final    BOTTLES DRAWN AEROBIC ONLY Blood Culture adequate volume Performed at [place] [address],  [place],  [address]    Culture   Final    NO GROWTH 2 DAYS Performed at [place] [address] [place],  [address]    Report Status PENDING  Incomplete      Studies: Nm Hepato W/eject Fract  Result Date:  09/30/2018 CLINICAL DATA:  Acute abdominal pain EXAM: NUCLEAR MEDICINE HEPATOBILIARY IMAGING WITH GALLBLADDER EF TECHNIQUE: Sequential images of the abdomen were obtained out to 60 minutes following intravenous administration of radiopharmaceutical. After oral ingestion of Ensure, gallbladder ejection fraction was determined. At 60 min, normal ejection fraction is greater than 33%. RADIOPHARMACEUTICALS:  4.6 mCi Tc-68m  Choletec IV COMPARISON:  Ultrasound the abdomen of 09/28/2018 and CT abdomen pelvis of 09/27/2017 FINDINGS: Prompt uptake and biliary excretion of activity by the liver is seen. Gallbladder activity is visualized, consistent with patency of cystic duct. Biliary activity passes into small bowel, consistent with patent common bile duct. Calculated gallbladder ejection fraction is 26%. (Normal gallbladder ejection fraction with Ensure is greater than 33%.) IMPRESSION: 1. No evidence of cystic duct obstruction. 2. Abnormal gallbladder ejection fraction of 26%. Electronically Signed   By: Ivar Drape M.D.   On: 09/30/2018 12:11   Dg Chest Port 1 View  Result Date: 09/30/2018 CLINICAL DATA:  Shortness of breath. EXAM: PORTABLE CHEST 1 VIEW COMPARISON:  Radiograph of September 29, 2018. FINDINGS: Stable cardiomegaly. Status post coronary bypass graft. Atherosclerosis of thoracic aorta is noted. No pneumothorax is noted. Pulmonary edema appears to be improved. Stable bibasilar subsegmental atelectasis is noted with probable minimal pleural effusions. Bony thorax is unremarkable. IMPRESSION: Stable bibasilar subsegmental atelectasis with probable minimal pleural effusions. Aortic Atherosclerosis (ICD10-I70.0). Electronically Signed   By: Marijo Conception, M.D.   On:  09/30/2018 09:58    Scheduled Meds: . amLODipine  5 mg Oral Daily  . feeding supplement (ENSURE ENLIVE)  237 mL Oral BID BM  . hydrALAZINE  100 mg Oral Q8H  . levETIRAcetam  750 mg Oral BID  . pantoprazole  40 mg Oral BID  . polyethylene glycol  17 g Oral BID    Continuous Infusions: . sodium chloride 50 mL/hr at 09/30/18 0200  . azithromycin 500 mg (09/29/18 2059)  . cefTRIAXone (ROCEPHIN)  IV 2 g (09/29/18 2008)  . metronidazole 500 mg (09/30/18 1328)     LOS: 2 days     Desiree Hane, MD [facility] Pager [phone]  If 7PM-7AM, please contact night-coverage [email or web address] Password [email or web address] 09/30/2018, 5:35 PM

## 2018-10-01 LAB — BASIC METABOLIC PANEL
ANION GAP: 9 (ref 5–15)
BUN: 29 mg/dL — ABNORMAL HIGH (ref 8–23)
CHLORIDE: 104 mmol/L (ref 98–111)
CO2: 23 mmol/L (ref 22–32)
Calcium: 8.4 mg/dL — ABNORMAL LOW (ref 8.9–10.3)
Creatinine, Ser: 1.14 mg/dL (ref 0.61–1.24)
GFR calc Af Amer: >60 mL/min (ref >60)
GFR calc non Af Amer: 55 mL/min — ABNORMAL LOW (ref >60)
Glucose, Bld: 112 mg/dL — ABNORMAL HIGH (ref 70–99)
Potassium: 4.2 mmol/L (ref 3.5–5.1)
Sodium: 136 mmol/L (ref 135–145)

## 2018-10-01 NOTE — Evaluation (Signed)
Physical Therapy Evaluation Patient Details Name: Joseph Bentley MRN: [id number] DOB: 07-15-1926 Today's Date: 10/01/2018   History of Present Illness  84 y.o. year old male with medical history significant for CAD, seizure disorder, hypertension, atrial fibrillation on chronic warfarin who presented on 09/27/2018 with chest and abdominal pain and was found to have acute hypoxic respiratory failure secondary to community-acquired pneumonia  Clinical Impression  Pt admitted with above diagnosis. Pt currently with functional limitations due to the deficits listed below (see PT Problem List). Pt will benefit from skilled PT to increase their independence and safety with mobility to allow discharge to the venue listed below.  Pt agreeable to OOB to recliner today.  Pt reports he is able to transfer to a w/c at baseline.  Pt requiring at least mod assist today for mobility and required supplemental oxygen.  Recommend d/c to SNF prior to back to ILF.   SATURATION QUALIFICATIONS: (This note is used to comply with regulatory documentation for home oxygen)  Patient Saturations on Room Air at Rest = 93%  Patient Saturations on Room Air while Transferring (pt nonambulatory) = 85%  Patient Saturations on 2 Liters of oxygen  = 92%  Please briefly explain why patient needs home oxygen: to improve oxygen saturations during physical exertion such as transfers.     Follow Up Recommendations SNF    Equipment Recommendations  None recommended by PT    Recommendations for Other Services       Precautions / Restrictions Precautions Precautions: Fall Precaution Comments: very HOH      Mobility  Bed Mobility Overal bed mobility: Needs Assistance Bed Mobility: Supine to Sit     Supine to sit: Mod assist;HOB elevated     General bed mobility comments: pt able to self assist upright however required assist for scooting to EOB, utilized bed pad  Transfers Overall transfer level: Needs  assistance Equipment used: 2 person hand held assist Transfers: Stand Pivot Transfers;Sit to/from Stand Sit to Stand: Mod assist;+2 physical assistance;+2 safety/equipment Stand pivot transfers: Mod assist;+2 physical assistance;+2 safety/equipment       General transfer comment: verbal cues for technique, assist to rise and steady as well as control descent  Ambulation/Gait                Stairs            Wheelchair Mobility    Modified Rankin (Stroke Patients Only)       Balance Overall balance assessment: Needs assistance         Standing balance support: Bilateral upper extremity supported;During functional activity Standing balance-Leahy Scale: Zero                               Pertinent Vitals/Pain Pain Assessment: No/denies pain    Home Living       Type of Home: Independent living facility       Home Layout: One level Home Equipment: Wheelchair - manual Additional Comments: per RN, pt from ILF, family hires caregiver for bathing    Prior Function Level of Independence: Needs assistance   Gait / Transfers Assistance Needed: pt reports being able to transfer to w/c for mobility, meals brought to his room  ADL's / Homemaking Assistance Needed: caregiver assists with bathing        Hand Dominance        Extremity/Trunk Assessment   Upper Extremity Assessment Upper Extremity Assessment: Generalized weakness  Lower Extremity Assessment Lower Extremity Assessment: Generalized weakness    Cervical / Trunk Assessment Cervical / Trunk Assessment: Kyphotic  Communication   Communication: HOH  Cognition Arousal/Alertness: Awake/alert Behavior During Therapy: WFL for tasks assessed/performed Overall Cognitive Status: Within Functional Limits for tasks assessed                                        General Comments      Exercises     Assessment/Plan    PT Assessment Patient needs continued  PT services  PT Problem List Decreased strength;Decreased mobility;Decreased activity tolerance;Decreased balance;Decreased knowledge of use of DME;Cardiopulmonary status limiting activity       PT Treatment Interventions DME instruction;Gait training;Therapeutic exercise;Therapeutic activities;Patient/family education;Functional mobility training;Balance training;Wheelchair mobility training    PT Goals (Current goals can be found in the Care Plan section)  Acute Rehab PT Goals PT Goal Formulation: With patient Time For Goal Achievement: 10/15/18 Potential to Achieve Goals: Good    Frequency Min 2X/week   Barriers to discharge        Co-evaluation               AM-PAC PT "6 Clicks" Daily Activity  Outcome Measure Difficulty turning over in bed (including adjusting bedclothes, sheets and blankets)?: Unable Difficulty moving from lying on back to sitting on the side of the bed? : Unable Difficulty sitting down on and standing up from a chair with arms (e.g., wheelchair, bedside commode, etc,.)?: Unable Help needed moving to and from a bed to chair (including a wheelchair)?: A Lot Help needed walking in hospital room?: Total Help needed climbing 3-5 steps with a railing? : Total 6 Click Score: 7    End of Session Equipment Utilized During Treatment: Gait belt;Oxygen Activity Tolerance: Patient limited by fatigue Patient left: in chair;with chair alarm set;with call bell/phone within reach Nurse Communication: Mobility status PT Visit Diagnosis: Other abnormalities of gait and mobility (R26.89)    Time: 3435-6861 PT Time Calculation (min) (ACUTE ONLY): 16 min   Charges:   PT Evaluation $PT Eval Low Complexity: [place], PT, DPT Acute Rehabilitation Services Office: [phone] Pager: [phone]   York Ram E 10/01/2018, 4:00 PM

## 2018-10-01 NOTE — Care Management Important Message (Signed)
Important Message  Patient Details  Name: Joseph Bentley MRN: [id number] Date of Birth: 23-Dec-1925   Medicare Important Message Given:  Yes    Kerin Salen 10/01/2018, 12:22 [place] Message  Patient Details  Name: Joseph Bentley MRN: [id number] Date of Birth: 07-21-1926   Medicare Important Message Given:  Yes    Kerin Salen 10/01/2018, 12:22 PM

## 2018-10-02 DIAGNOSIS — R001 Bradycardia, unspecified: Secondary | ICD-10-CM | POA: Clinically undetermined

## 2018-10-02 MED ORDER — LABETALOL HCL 200 MG PO TABS
200.0000 mg | ORAL_TABLET | Freq: Two times a day (BID) | ORAL | Status: DC
  Administered 2018-10-02: 200 mg via ORAL
  Filled 2018-10-02: qty 1

## 2018-10-02 MED ORDER — AMLODIPINE BESYLATE 10 MG PO TABS: 10.0000 mg | ORAL_TABLET | Freq: Every day | ORAL | Status: DC

## 2018-10-02 MED ORDER — AMLODIPINE BESYLATE 10 MG PO TABS: 10.0000 mg | ORAL_TABLET | Freq: Every day | ORAL | Status: AC

## 2018-10-02 MED ORDER — ENSURE ENLIVE PO LIQD: 237.0000 mL | Freq: Two times a day (BID) | ORAL | 12 refills | Status: AC

## 2018-10-02 MED ORDER — PANTOPRAZOLE SODIUM 40 MG PO TBEC: 40.0000 mg | DELAYED_RELEASE_TABLET | Freq: Two times a day (BID) | ORAL | 0 refills | Status: AC

## 2018-10-02 MED ORDER — POLYETHYLENE GLYCOL 3350 17 G PO PACK: 17.0000 g | PACK | Freq: Two times a day (BID) | ORAL | Status: AC | PRN

## 2018-10-02 MED ORDER — LEVETIRACETAM 750 MG PO TABS: 750.0000 mg | ORAL_TABLET | Freq: Two times a day (BID) | ORAL | 0 refills | Status: AC

## 2018-10-02 MED ORDER — WARFARIN SODIUM 5 MG PO TABS: ORAL_TABLET | ORAL | 0 refills | Status: AC

## 2018-10-02 NOTE — Progress Notes (Signed)
Called report to Santiago Glad at [facility] [phone])

## 2018-10-02 NOTE — NC FL2 (Signed)
[place] LEVEL OF CARE SCREENING TOOL     IDENTIFICATION  Patient Name: Joseph Bentley Birthdate: March 30, 1926 Sex: male Admission Date (Current Location): 09/27/2018  [facility] and [place] Number:  Herbalist and Address:  [facility],  [place], [place]      Provider Number: [id number]  Attending Physician Name and Address:  Desiree Hane, MD  Relative Name and Phone Number:  Percival Glasheen [phone]    Current Level of Care: Hospital Recommended Level of Care: [place] Prior Approval Number:    Date Approved/Denied:   PASRR Number: [id number]  Discharge Plan: SNF    Current Diagnoses: Patient Active Problem List   Diagnosis Date Noted  . Abdominal pain 09/30/2018  . AAA (abdominal aortic aneurysm) ([place]) 09/30/2018  . Aspiration pneumonitis ([place]) 09/30/2018  . Acute respiratory failure with hypoxia ([place]) 09/30/2018  . Community acquired pneumonia 09/27/2018  . Hypertensive urgency 09/27/2018  . Chest pain 09/27/2018  . Herpes zoster 04/05/2016  . Pelvic fracture ([place]) 04/05/2016  . Vitamin D deficiency 01/02/2016  . Routine general medical examination at a health care facility 01/02/2016  . GERD (gastroesophageal reflux disease) 01/12/2014  . Gait instability 12/27/2013  . Hemorrhoids 08/20/2013  . Other and unspecified hyperlipidemia 08/20/2013  . Testosterone insufficiency 08/20/2013  . CAD (coronary artery disease) 01/14/2012  . Seizure ([place]) 12/08/2011  . HTN (hypertension) 12/08/2011  . Atrial fibrillation ([place]) 12/08/2011  . Anticoagulant Defne Gerling-term use 12/08/2011    Orientation RESPIRATION BLADDER Height & Weight     Self  O2(Nasal Cannula 2L/min) Incontinent Weight: 130 lb 8.2 oz (59.2 kg) Height:  5\' 10"  (177.8 cm)  BEHAVIORAL SYMPTOMS/MOOD NEUROLOGICAL BOWEL NUTRITION STATUS        Diet(Heart Healthy)  AMBULATORY STATUS COMMUNICATION OF NEEDS Skin   Total Care  Verbally Normal                       Personal Care Assistance Level of Assistance  Bathing, Feeding, Dressing Bathing Assistance: Maximum assistance Feeding assistance: Limited assistance Dressing Assistance: Maximum assistance     Functional Limitations Info  Sight, Hearing, Speech Sight Info: Adequate Hearing Info: Impaired Speech Info: Adequate    SPECIAL CARE FACTORS FREQUENCY  PT (By licensed PT), OT (By licensed OT)     PT Frequency: 5x/week OT Frequency: 5x/week            Contractures      Additional Factors Info  Code Status, Allergies Code Status Info: DNR Allergies Info: Ace Inhibitors           Current Medications (10/02/2018):  This is the current hospital active medication list Current Facility-Administered Medications  Medication Dose Route Frequency Provider Last Rate Last Dose  . feeding supplement (ENSURE ENLIVE) (ENSURE ENLIVE) liquid 237 mL  237 mL Oral BID BM Sheikh, Georgina Quint [place], DO   237 mL at 10/02/18 1027  . hydrALAZINE (APRESOLINE) injection 10 mg  10 mg Intravenous Q6H PRN Sheikh, Omair Latif, DO      . hydrALAZINE (APRESOLINE) tablet 100 mg  100 mg Oral Q8H Lelon Perla, MD   100 mg at 10/02/18 0612  . labetalol (NORMODYNE) tablet 200 mg  200 mg Oral BID Oretha Milch D, MD   200 mg at 10/02/18 1027  . levETIRAcetam (KEPPRA) tablet 750 mg  750 mg Oral BID Gala Romney L, MD   750 mg at 10/02/18 1026  . pantoprazole (PROTONIX) EC  tablet 40 mg  40 mg Oral BID Gala Romney L, MD   40 mg at 10/02/18 1027  . polyethylene glycol (MIRALAX / GLYCOLAX) packet 17 g  17 g Oral BID Elwyn Reach, MD   17 g at 10/02/18 1027     Discharge Medications: Please see discharge summary for a list of discharge medications.  Relevant Imaging Results:  Relevant Lab Results:   Additional Information SSN [id number]  Burnis Medin, LCSW

## 2018-10-02 NOTE — Clinical Social Work Note (Signed)
Clinical Social Work Assessment  Patient Details  Name: Joseph Bentley MRN: [id number] Date of Birth: 07-05-1926  Date of referral:  10/02/18               Reason for consult:  Facility Placement                Permission sought to share information with:    Permission granted to share information::     Name::        Agency::     Relationship::     Contact Information:     Housing/Transportation Living arrangements for the past 2 months:  Independent Living Facility([facility]) Source of Information:  Adult Children(Son - Marcheta Grammes) Patient Interpreter Needed:  None Criminal Activity/Legal Involvement Pertinent to Current Situation/Hospitalization:  No - Comment as needed Significant Relationships:  Adult Children Lives with:  Self, Facility Resident Do you feel safe going back to the place where you live?  (PT recommending) Need for family participation in patient care:  Yes (Comment)  Care giving concerns:  Patient from [place]. Patient's son reported that patient is wheelchair bound at baseline and able to transfer and toilet independently. Patient's son reported that patient has an aide in the morning the assists with dressing and aide at night the assists patient with bedtime. Patient's son reported that patient has a bathing aide that comes every other day to bathe patient. Patient's son reported that patient is independent with meals and managing medication. Patient has a "medical history significant for CAD, seizure disorder, hypertension, atrial fibrillation on chronic warfarin who presented on 09/27/2018 with chest and abdominal pain and was found to have acute hypoxic respiratory failure secondary to community-acquired pneumonia". PT recommending SNF.   Social Worker assessment / plan:  CSW spoke with patient's son regarding PT recommendation for SNF and discharge planning, patient unable to participate in assessment. CSW explained SNF placement  process and medicare coverage, patient's son verbalized understanding noting that patient has been to SNF in the past. CSW agreed to complete patient's FL2 and follow up with bed offers. Patient's son reported that he prefers [facility] or [facility]. CSW agreed to follow up with preferred SNFs.   CSW completed FL2 and will follow up with preferred SNFs. CSW will continue to follow and assist with discharge planning.  Employment status:  Retired Forensic scientist:  Medicare PT Recommendations:    Information / Referral to community resources:  [place]  Patient/Family's Response to care:  Patient's son appreciative of CSW assistance with discharge planning.  Patient/Family's Understanding of and Emotional Response to Diagnosis, Current Treatment, and Prognosis:  Patient's son involved in patient's care and verbalized understanding of current treatment plan. Patient's son verbalized plan for patient to discharge to SNF for ST rehab prior to returning home.  Emotional Assessment Appearance:    Attitude/Demeanor/Rapport:  Unable to Assess Affect (typically observed):  Unable to Assess Orientation:  Oriented to Self Alcohol / Substance use:  Not Applicable Psych involvement (Current and /or in the community):  No (Comment)  Discharge Needs  Concerns to be addressed:  Care Coordination Readmission within the last 30 days:  No Current discharge risk:  Lives alone Barriers to Discharge:  No Barriers Identified   Burnis Medin, LCSW 10/02/2018, 12:04 PM

## 2018-10-02 NOTE — Discharge Summary (Signed)
Discharge Summary  Joseph Bentley EXB:[id number] DOB: 1926-01-08  PCP: Reymundo Poll, MD  Admit date: 09/27/2018 Discharge date: 10/02/2018   Time spent: < 25 minutes  Admitted From: ILF Disposition:  SNF  Recommendations for Outpatient Follow-up:  1. Follow up with PCP in 1-2 weeks 2. Will need follow-up with vascular surgery and cardiology as outpatient for 4.1 cm AAA, Dr. Martinique (cardiology) follow up in 4 to 6 weeks after discharge as outpatient 3. Ok to resume home warfarin on discharge now that alert per cardiology recommendations, will need INR check 4. Discontinue labetalol in setting of bradycardia with atrial fibrillation 5. BP check in 1-2 weeks, Amlodipine 10 mg ( new medication)       Discharge Diagnoses:  Active Hospital Problems   Diagnosis Date Noted  . Community acquired pneumonia 09/27/2018  . Bradycardia 10/02/2018  . Abdominal pain 09/30/2018  . AAA (abdominal aortic aneurysm) ([place]) 09/30/2018  . Aspiration pneumonitis ([place]) 09/30/2018  . Acute respiratory failure with hypoxia ([place]) 09/30/2018  . Hypertensive urgency 09/27/2018  . Chest pain 09/27/2018  . GERD (gastroesophageal reflux disease) 01/12/2014  . CAD (coronary artery disease) 01/14/2012  . Atrial fibrillation ([place]) 12/08/2011  . HTN (hypertension) 12/08/2011  . Seizure ([place]) 12/08/2011    Resolved Hospital Problems  No resolved problems to display.    Discharge Condition: Stable   CODE STATUS:DNR   History of present illness:  Joseph Bentley is a 84 y.o. year old male with medical history significant for CAD, seizure disorder, GERD, HTN, A. fib on chronic warfarin therapy who presented on 09/27/2018 with chest tightness and dyspnea and was found to have acute hypoxic respiratory failure secondary to pneumonia. Remaining hospital course addressed in problem based format below:   Hospital Course:   Acute hypoxic respiratory failure secondary to pneumonia (community acquired disease  and likely some element of aspiration pneumonitis).  Admitting chest x-ray with hazy opacities concerning for pneumonia.  Patient was empirically started on IV ceftriaxone and azithromycin.  Strep pneumo negative, no findings on blood cultures, no RVP was obtained.  Repeat chest x-ray on 10/16 shows small pleural effusions and some atelectasis.  Patient tolerated transition to oral azithromycin and Cefdinir on 10/16. Incentive spirometry for supportive care. Currently on 2 L [place] oxygen  Nonspecific chest pain and abdominal pain.  Troponin was slightly elevated but remained stable at 0.03.  TTE showed no wall motion normalities.  Cardiology recommends continue medical management with no plan for ischemic evaluation.  Will follow with Dr. Martinique in 4 to 6 weeks after discharge as outpatient. Seems more consistent with noncardiac chest pain.  Patient was later found to have abdominal pain and concern for distended gallbladder on right upper quadrant ultrasound imaging.  General surgery recommended HIDA scan which showed no abnormalities.  Patient's abdominal pain resolved on its own in addition to supportive care with Protonix which she should continue on discharge  Atrial fibrillation with slow ventricular response, resolved.  Cardiology evaluated and recommended holding patient's labetalol and to avoid further AV nodal blocking agents in the future.  Patient has remained asymptomatic and no indication for pacemaker.  Cardiology recommends resuming warfarin as the patient has become more alert on discharge. Will need INR check.   HTN. Had elevated BP to 170s.  Likely related to discontinuing home labetalol.  Was started on amlodipine with improvement in SBP to 160s and discharged on amlodipine 10 mg. Continue hydralazine 100mg  TID  Will need BP check at facility.  Aortic stenosis,  stable.  Cardiology will continue to follow as outpatient  Infrarenal abdominal aortic aneurysm.  4.1 cm on CT abdominal imaging  with focal high-grade stenosis in the distal left external iliac artery.  Will need follow-up with vascular surgery and cardiology as outpatient.  Incidental Compression fracture of L1 and L4. Stable.Patient has remained asymptomatic  History of epilepsy and grand mal seizures.  Continue home [place].  Has remained seizure-free during hospital stay. Consultations:  Cardiology  Procedures/Studies: HIDA scan on 10/16-no evidence of cystic duct obstruction.  Discharge Exam: BP (!) 160/84 (BP Location: Right Arm)   Pulse 85   Temp 97.6 F (36.4 C) (Oral)   Resp 18   Ht 5\' 10"  (1.778 m)   Wt 59.2 kg   SpO2 98%   BMI 18.73 kg/m   General-elderly male, lying in bed in no distress, very hard of hearing Eyes-EOMI, anicteric sclera ENT-moist oral mucosa CV-regular rate and rhythm, systolic murmur heard throughout chest Respiratory- normal respiratory effort, 2 L nasal cannula, diminished breath sounds throughout,  Neurologic- no gross focal neurologic deficits   Discharge Instructions You were cared for by a hospitalist during your hospital stay. If you have any questions about your discharge medications or the care you received while you were in the hospital after you are discharged, you can call the unit and asked to speak with the hospitalist on call if the hospitalist that took care of you is not available. Once you are discharged, your primary care physician will handle any further medical issues. Please note that NO REFILLS for any discharge medications will be authorized once you are discharged, as it is imperative that you return to your primary care physician (or establish a relationship with a primary care physician if you do not have one) for your aftercare needs so that they can reassess your need for medications and monitor your lab values.  Discharge Instructions    Diet - low sodium heart healthy   Complete by:  As directed    Increase activity slowly   Complete by:  As  directed      Allergies as of 10/02/2018      Reactions   Ace Inhibitors    unknown      Medication List    STOP taking these medications   labetalol 200 MG tablet Commonly known as:  NORMODYNE     TAKE these medications   amLODipine 10 MG tablet Commonly known as:  NORVASC Take 1 tablet (10 mg total) by mouth daily.   feeding supplement (ENSURE ENLIVE) Liqd Take 237 mLs by mouth 2 (two) times daily between meals.   hydrALAZINE 100 MG tablet Commonly known as:  APRESOLINE Take 1 tablet (100 mg total) by mouth 3 (three) times daily.   levETIRAcetam 750 MG tablet Commonly known as:  KEPPRA Take 1 tablet (750 mg total) by mouth 2 (two) times daily.   pantoprazole 40 MG tablet Commonly known as:  PROTONIX Take 1 tablet (40 mg total) by mouth 2 (two) times daily. What changed:    when to take this  reasons to take this   polyethylene glycol packet Commonly known as:  MIRALAX / GLYCOLAX Take 17 g by mouth 2 (two) times daily as needed for mild constipation or moderate constipation. Take 17g by mouth BID until you achieve daily soft stools. If stools become loose, may cut back to once daily   warfarin 5 MG tablet Commonly known as:  COUMADIN TAKE AS DIRECTED BY ANTICOAGULATION CLINIC  Allergies  Allergen Reactions  . Ace Inhibitors     unknown   Follow-up Information    Erlene Quan, PA-C Follow up on 11/04/2018.   Specialties:  Cardiology, Radiology Why:  10:30 AM (Dr. Doug Sou PA) Contact information: [address] [place] [address] [phone]            The results of significant diagnostics from this hospitalization (including imaging, microbiology, ancillary and laboratory) are listed below for reference.    Significant Diagnostic Studies: Dg Chest 2 View  Result Date: 09/27/2018 CLINICAL DATA:  Dyspnea EXAM: CHEST - 2 VIEW COMPARISON:  07/14/2018 chest radiograph. FINDINGS: Low lung volumes. Intact sternotomy wires.  Stable cardiomediastinal silhouette with mild cardiomegaly. No pneumothorax. Stable chronic blunting of the right costophrenic angle with elevation of the right hemidiaphragm. No convincing pleural effusions. Borderline mild pulmonary edema. Hazy opacities at the lung bases bilaterally. IMPRESSION: 1. Low lung volumes. Hazy opacities at the lung bases bilaterally, favor atelectasis. Chronic pleural-parenchymal scarring at the right costophrenic angle with elevation of the right hemidiaphragm. 2. Stable cardiomegaly with borderline mild pulmonary edema. Electronically Signed   By: Ilona Sorrel M.D.   On: 09/27/2018 19:53   Ct Abdomen Pelvis W Contrast  Result Date: 09/27/2018 CLINICAL DATA:  Weaker than normal with generalized abdominal pain and back pain. EXAM: CT ABDOMEN AND PELVIS WITH CONTRAST TECHNIQUE: Multidetector CT imaging of the abdomen and pelvis was performed using the standard protocol following bolus administration of intravenous contrast. CONTRAST:  90mL OMNIPAQUE IOHEXOL 300 MG/ML  SOLN COMPARISON:  CT pelvis 04/03/2016 FINDINGS: Lower chest: Minimal bilateral effusions with coarse infiltrates in the lung bases possibly representing pneumonia or aspiration. Cardiac enlargement. Postoperative changes in the mediastinum consistent with coronary artery bypass. Hepatobiliary: The gallbladder is distended with suggestion of mild layering sludge. No discrete stones or wall thickening. Bile ducts are not dilated. Mild diffuse fatty infiltration of the liver without focal lesion. Pancreas: Unremarkable. No pancreatic ductal dilatation or surrounding inflammatory changes. Spleen: Normal in size without focal abnormality. Adrenals/Urinary Tract: No adrenal gland nodules. Asymmetrical left renal atrophy suggesting possibility of chronic renal vascular disease. Calcification in the origins of both renal arteries. Renal nephrograms are symmetrical and homogeneous. No hydronephrosis or hydroureter. Bladder is  unremarkable. Stomach/Bowel: Stomach and small bowel are decompressed. Scattered stool throughout the colon without colonic distention. No wall thickening is appreciated. There is mild infiltration in the presacral fat which may represent inflammatory process such as proctitis. No discrete abscess. Appendix is normal. Vascular/Lymphatic: Diffuse aortic calcification. Infrarenal abdominal aortic aneurysm measuring 4.1 cm maximal AP diameter. Suggestion of focal high-grade stenosis in the distal left external iliac artery with reconstitution of flow distally. No significant lymphadenopathy. Reproductive: Prostate is unremarkable. Other: No free air or free fluid in the abdomen. Abdominal wall musculature appears intact. Musculoskeletal: Diffuse bone demineralization. Old appearing compression deformities at L1 and L4 with kyphoplasty at L4. Changes likely represent osteoporosis. IMPRESSION: 1. Minimal bilateral pleural effusions with coarse infiltrates in the lung bases possibly representing pneumonia or aspiration. 2. Distended gallbladder with suggestion of layering sludge. No stones or wall thickening. 3. Diffuse fatty infiltration of the liver. 4. 4.1 cm infrarenal abdominal aortic aneurysm. Focal high-grade stenosis in the distal left external iliac artery. 5. No evidence of bowel obstruction. Mild infiltration in the presacral fat may represent inflammatory process such as proctitis. 6. Asymmetrical left renal atrophy suggesting chronic renal vascular disease. 7. Osteoporotic changes in the spine with compression deformities at L1 and  L4. Electronically Signed   By: Lucienne Capers M.D.   On: 09/27/2018 21:39   Nm Hepato W/eject Fract  Result Date: 09/30/2018 CLINICAL DATA:  Acute abdominal pain EXAM: NUCLEAR MEDICINE HEPATOBILIARY IMAGING WITH GALLBLADDER EF TECHNIQUE: Sequential images of the abdomen were obtained out to 60 minutes following intravenous administration of radiopharmaceutical. After oral  ingestion of Ensure, gallbladder ejection fraction was determined. At 60 min, normal ejection fraction is greater than 33%. RADIOPHARMACEUTICALS:  4.6 mCi Tc-43m  Choletec IV COMPARISON:  Ultrasound the abdomen of 09/28/2018 and CT abdomen pelvis of 09/27/2017 FINDINGS: Prompt uptake and biliary excretion of activity by the liver is seen. Gallbladder activity is visualized, consistent with patency of cystic duct. Biliary activity passes into small bowel, consistent with patent common bile duct. Calculated gallbladder ejection fraction is 26%. (Normal gallbladder ejection fraction with Ensure is greater than 33%.) IMPRESSION: 1. No evidence of cystic duct obstruction. 2. Abnormal gallbladder ejection fraction of 26%. Electronically Signed   By: Ivar Drape M.D.   On: 09/30/2018 12:11   US Abdomen Limited  Result Date: 09/28/2018 CLINICAL DATA:  Abdominal pain EXAM: ULTRASOUND ABDOMEN LIMITED RIGHT UPPER QUADRANT COMPARISON:  CT abdomen pelvis 09/27/2018 FINDINGS: Gallbladder: There is gallbladder sludge and small amount of pericholecystic fluid. A negative sonographic Percell Miller sign was reported by the sonographer. No wall thickening. Common bile duct: Diameter: 4 mm Liver: No focal lesion identified. Within normal limits in parenchymal echogenicity. Portal vein is patent on color Doppler imaging with normal direction of blood flow towards the liver. There is free fluid adjacent to the liver. IMPRESSION: 1. Gallbladder sludge and small amount of pericholecystic fluid without other evidence of acute cholecystitis. 2. Small volume perihepatic ascites. Electronically Signed   By: Ulyses Jarred M.D.   On: 09/28/2018 03:22   Dg Chest Port 1 View  Result Date: 09/30/2018 CLINICAL DATA:  Shortness of breath. EXAM: PORTABLE CHEST 1 VIEW COMPARISON:  Radiograph of September 29, 2018. FINDINGS: Stable cardiomegaly. Status post coronary bypass graft. Atherosclerosis of thoracic aorta is noted. No pneumothorax is noted.  Pulmonary edema appears to be improved. Stable bibasilar subsegmental atelectasis is noted with probable minimal pleural effusions. Bony thorax is unremarkable. IMPRESSION: Stable bibasilar subsegmental atelectasis with probable minimal pleural effusions. Aortic Atherosclerosis (ICD10-I70.0). Electronically Signed   By: Marijo Conception, M.D.   On: 09/30/2018 09:58   Dg Chest Port 1 View  Result Date: 09/29/2018 CLINICAL DATA:  Shortness of breath. EXAM: PORTABLE CHEST 1 VIEW COMPARISON:  Radiographs of September 27, 2018. FINDINGS: Stable cardiomegaly. Status post coronary artery bypass graft. Atherosclerosis of abdominal aorta is noted. Mild bilateral pulmonary edema may be present. Mild bibasilar subsegmental atelectasis is noted with possible small pleural effusions. Bony thorax is unremarkable. IMPRESSION: Stable cardiomegaly with mild bilateral pulmonary edema. Probable mild bibasilar subsegmental atelectasis or small pleural effusions. Aortic Atherosclerosis (ICD10-I70.0). Electronically Signed   By: Marijo Conception, M.D.   On: 09/29/2018 08:33    Microbiology: Recent Results (from the past 240 hour(s))  Urine Culture     Status: Abnormal   Collection Time: 09/27/18  7:32 PM  Result Value Ref Range Status   Specimen Description   Final    URINE, RANDOM Performed at [facility] [address], [place], [place] [address]    Special Requests   Final    NONE Performed at [facility], [place] [address], [place], [place] [address]    Culture (A)  Final    <10,000  COLONIES/mL INSIGNIFICANT GROWTH Performed at [facility] [address], [place], [place] [address]    Report Status 09/29/2018 FINAL  Final  Culture, blood (routine x 2) Call MD if unable to obtain prior to antibiotics being given     Status: None (Preliminary result)   Collection Time: 09/28/18  1:44 AM  Result Value Ref Range Status   Specimen Description   Final    BLOOD LEFT  ANTECUBITAL Performed at [facility] [address], [place], [place] [address]    Special Requests   Final    BOTTLES DRAWN AEROBIC AND ANAEROBIC Blood Culture adequate volume Performed at [facility] [address], [place], [place] [address]    Culture   Final    NO GROWTH 4 DAYS Performed at [facility], [place] [address] [place], [place] [address]    Report Status PENDING  Incomplete  Culture, blood (routine x 2) Call MD if unable to obtain prior to antibiotics being given     Status: None (Preliminary result)   Collection Time: 09/28/18  1:44 AM  Result Value Ref Range Status   Specimen Description   Final    BLOOD LEFT ARM Performed at [facility] [address], [place], [place] [address]    Special Requests   Final    BOTTLES DRAWN AEROBIC ONLY Blood Culture adequate volume Performed at [facility] [address] [place], [place] [address]    Culture   Final    NO GROWTH 4 DAYS Performed at [facility], [place] [address], [place],  [address]    Report Status PENDING  Incomplete     Labs: Basic Metabolic Panel: Recent Labs  Lab 09/27/18 1952 09/28/18 0511 09/28/18 0916 09/29/18 0447 09/30/18 0417 10/01/18 0410  NA 135 131*  --  135 137 136  K 5.0 4.7  --  4.8 4.5 4.2  CL 98 98  --  102 104 104  CO2 26 24  --  23 25 23   GLUCOSE 103* 110*  --  132* 102* 112*  BUN 28* 23  --  21 29* 29*  CREATININE 1.16 1.09  --  0.97 1.37* 1.14  CALCIUM 9.2 8.5*  --  9.1 8.7* 8.4*  MG  --   --  2.1 2.2 2.2  --   PHOS  --   --  3.4 3.4 4.2  --    Liver Function Tests: Recent Labs  Lab 09/27/18 1952 09/28/18 0511 09/29/18 0447 09/30/18 0417  AST 31 27 27 23   ALT 16 16 16 13   ALKPHOS 129* 99 120 89  BILITOT 0.9 0.8 0.8 0.7  PROT 7.5 6.1* 6.9 5.9*  ALBUMIN 4.0 3.3* 3.8 3.0*   Recent Labs  Lab 09/27/18 1952  LIPASE 35   No results for input(s): AMMONIA in the last  168 hours. CBC: Recent Labs  Lab 09/27/18 1952 09/28/18 0511 09/29/18 0447 09/30/18 0417  WBC 5.5 5.0 5.5 5.9  NEUTROABS 3.8  --  4.3 4.4  HGB 13.6 12.3* 13.6 11.9*  HCT 42.3 38.9* 41.8 37.7*  MCV 96.4 97.3 95.0 97.4  PLT 170 148* 166 143*   Cardiac Enzymes: Recent Labs  Lab 09/27/18 1952 09/28/18 0916 09/28/18 1453 09/28/18 2054  TROPONINI 0.03* 0.03* 0.03* 0.03*   BNP: BNP (last 3 results) Recent Labs    07/14/18 1031  BNP 283.5*    ProBNP (last 3 results) No results for  input(s): PROBNP in the last 8760 hours.  CBG: Recent Labs  Lab 09/29/18 2134  GLUCAP 139*       Signed:  Desiree Hane, MD [facility] 10/02/2018, 1:02 PM

## 2018-10-02 NOTE — Clinical Social Work Placement (Signed)
Patient received and accepted bed offer at [facility]. Facility aware of discharge and confirmed bed offer. PTAR contacted, patient's family notified. Patient's RN can call report to [phone], packet complete. CSW signing off, no other needs identified at this time.  CLINICAL SOCIAL WORK PLACEMENT  NOTE  Date:  10/02/2018  Patient Details  Name: Joseph Bentley MRN: [id number] Date of Birth: 07-Jul-1926  Clinical Social Work is seeking post-discharge placement for this patient at the [place] level of care (*CSW will initial, date and re-position this form in  chart as items are completed):  Yes   Patient/family provided with [place] Work Department's list of facilities offering this level of care within the geographic area requested by the patient (or if unable, by the patient's family).  Yes   Patient/family informed of their freedom to choose among providers that offer the needed level of care, that participate in Medicare, Medicaid or managed care program needed by the patient, have an available bed and are willing to accept the patient.  Yes   Patient/family informed of [facility]'s ownership interest in [facility] and [facility], as well as of the fact that they are under no obligation to receive care at these facilities.  PASRR submitted to EDS on       PASRR number received on       Existing PASRR number confirmed on 10/02/18     FL2 transmitted to all facilities in geographic area requested by pt/family on 10/02/18     FL2 transmitted to all facilities within larger geographic area on       Patient informed that his/her managed care company has contracts with or will negotiate with certain facilities, including the following:        Yes   Patient/family informed of bed offers received.  Patient [facility] bed at [facility]     Physician recommends and patient chooses bed at      Patient to be  transferred to [facility] on 10/02/18.  Patient to be transferred to facility by PTAR     Patient family notified on 10/02/18 of transfer.  Name of family member notified:  Son - Dezman Granda.     PHYSICIAN       Additional Comment:    _______________________________________________ Burnis Medin, LCSW 10/02/2018, 4:03 PM

## 2018-10-02 NOTE — Progress Notes (Signed)
CSW consulted for SNF placement. Patient oriented to person and unable to participate in assessment. CSW contacted patient's son Lenox Bink [phone]) to complete assessment, no answer. CSW left voicemail requesting return phone call.  Abundio Miu, [place] Social Worker [facility] Cell#: [phone]

## 2018-10-03 LAB — CULTURE, BLOOD (ROUTINE X 2)
CULTURE: NO GROWTH
Culture: NO GROWTH
SPECIAL REQUESTS: ADEQUATE
SPECIAL REQUESTS: ADEQUATE

## 2018-10-17 ENCOUNTER — Encounter (HOSPITAL_COMMUNITY): Payer: Self-pay

## 2018-10-17 ENCOUNTER — Emergency Department (HOSPITAL_COMMUNITY): Payer: Medicare Other

## 2018-10-17 ENCOUNTER — Inpatient Hospital Stay (HOSPITAL_COMMUNITY)
Admission: EM | Admit: 2018-10-17 | Discharge: 2018-11-15 | DRG: 065 | Disposition: E | Payer: Medicare Other | Attending: Internal Medicine | Admitting: Internal Medicine

## 2018-10-17 DIAGNOSIS — Z9842 Cataract extraction status, left eye: Secondary | ICD-10-CM

## 2018-10-17 DIAGNOSIS — R4701 Aphasia: Secondary | ICD-10-CM | POA: Diagnosis present

## 2018-10-17 DIAGNOSIS — I110 Hypertensive heart disease with heart failure: Secondary | ICD-10-CM | POA: Diagnosis present

## 2018-10-17 DIAGNOSIS — Z7901 Long term (current) use of anticoagulants: Secondary | ICD-10-CM

## 2018-10-17 DIAGNOSIS — I482 Chronic atrial fibrillation, unspecified: Secondary | ICD-10-CM | POA: Diagnosis not present

## 2018-10-17 DIAGNOSIS — K219 Gastro-esophageal reflux disease without esophagitis: Secondary | ICD-10-CM | POA: Diagnosis present

## 2018-10-17 DIAGNOSIS — Z87891 Personal history of nicotine dependence: Secondary | ICD-10-CM

## 2018-10-17 DIAGNOSIS — I509 Heart failure, unspecified: Secondary | ICD-10-CM | POA: Diagnosis present

## 2018-10-17 DIAGNOSIS — I6340 Cerebral infarction due to embolism of unspecified cerebral artery: Secondary | ICD-10-CM | POA: Diagnosis not present

## 2018-10-17 DIAGNOSIS — R29810 Facial weakness: Secondary | ICD-10-CM | POA: Diagnosis present

## 2018-10-17 DIAGNOSIS — G8191 Hemiplegia, unspecified affecting right dominant side: Secondary | ICD-10-CM | POA: Diagnosis present

## 2018-10-17 DIAGNOSIS — Z951 Presence of aortocoronary bypass graft: Secondary | ICD-10-CM

## 2018-10-17 DIAGNOSIS — N400 Enlarged prostate without lower urinary tract symptoms: Secondary | ICD-10-CM | POA: Diagnosis present

## 2018-10-17 DIAGNOSIS — R402362 Coma scale, best motor response, obeys commands, at arrival to emergency department: Secondary | ICD-10-CM | POA: Diagnosis present

## 2018-10-17 DIAGNOSIS — F329 Major depressive disorder, single episode, unspecified: Secondary | ICD-10-CM | POA: Diagnosis present

## 2018-10-17 DIAGNOSIS — Z961 Presence of intraocular lens: Secondary | ICD-10-CM | POA: Diagnosis present

## 2018-10-17 DIAGNOSIS — Z8249 Family history of ischemic heart disease and other diseases of the circulatory system: Secondary | ICD-10-CM

## 2018-10-17 DIAGNOSIS — Z79899 Other long term (current) drug therapy: Secondary | ICD-10-CM

## 2018-10-17 DIAGNOSIS — R296 Repeated falls: Secondary | ICD-10-CM | POA: Diagnosis not present

## 2018-10-17 DIAGNOSIS — Z515 Encounter for palliative care: Secondary | ICD-10-CM | POA: Diagnosis present

## 2018-10-17 DIAGNOSIS — I10 Essential (primary) hypertension: Secondary | ICD-10-CM

## 2018-10-17 DIAGNOSIS — I639 Cerebral infarction, unspecified: Secondary | ICD-10-CM | POA: Diagnosis present

## 2018-10-17 DIAGNOSIS — R402252 Coma scale, best verbal response, oriented, at arrival to emergency department: Secondary | ICD-10-CM | POA: Diagnosis present

## 2018-10-17 DIAGNOSIS — Z9981 Dependence on supplemental oxygen: Secondary | ICD-10-CM

## 2018-10-17 DIAGNOSIS — Z66 Do not resuscitate: Secondary | ICD-10-CM | POA: Diagnosis present

## 2018-10-17 DIAGNOSIS — R32 Unspecified urinary incontinence: Secondary | ICD-10-CM | POA: Diagnosis present

## 2018-10-17 DIAGNOSIS — R402142 Coma scale, eyes open, spontaneous, at arrival to emergency department: Secondary | ICD-10-CM | POA: Diagnosis present

## 2018-10-17 DIAGNOSIS — F419 Anxiety disorder, unspecified: Secondary | ICD-10-CM | POA: Diagnosis present

## 2018-10-17 DIAGNOSIS — G40409 Other generalized epilepsy and epileptic syndromes, not intractable, without status epilepticus: Secondary | ICD-10-CM | POA: Diagnosis present

## 2018-10-17 DIAGNOSIS — E785 Hyperlipidemia, unspecified: Secondary | ICD-10-CM | POA: Diagnosis present

## 2018-10-17 DIAGNOSIS — R29722 NIHSS score 22: Secondary | ICD-10-CM | POA: Diagnosis present

## 2018-10-17 DIAGNOSIS — Z888 Allergy status to other drugs, medicaments and biological substances status: Secondary | ICD-10-CM

## 2018-10-17 DIAGNOSIS — I2510 Atherosclerotic heart disease of native coronary artery without angina pectoris: Secondary | ICD-10-CM | POA: Diagnosis present

## 2018-10-17 DIAGNOSIS — R269 Unspecified abnormalities of gait and mobility: Secondary | ICD-10-CM | POA: Diagnosis present

## 2018-10-17 LAB — I-STAT CHEM 8, ED
BUN: 22 mg/dL (ref 8–23)
Calcium, Ion: 1.07 mmol/L — ABNORMAL LOW (ref 1.15–1.40)
Chloride: 97 mmol/L — ABNORMAL LOW (ref 98–111)
Creatinine, Ser: 0.9 mg/dL (ref 0.61–1.24)
Glucose, Bld: 99 mg/dL (ref 70–99)
HCT: 37 % — ABNORMAL LOW (ref 39.0–52.0)
HEMOGLOBIN: 12.6 g/dL — AB (ref 13.0–17.0)
POTASSIUM: 4.1 mmol/L (ref 3.5–5.1)
SODIUM: 132 mmol/L — AB (ref 135–145)
TCO2: 27 mmol/L (ref 22–32)

## 2018-10-17 LAB — CBC
HCT: 38.5 % — ABNORMAL LOW (ref 39.0–52.0)
HEMOGLOBIN: 12.5 g/dL — AB (ref 13.0–17.0)
MCH: 30.6 pg (ref 26.0–34.0)
MCHC: 32.5 g/dL (ref 30.0–36.0)
MCV: 94.4 fL (ref 80.0–100.0)
Platelets: 239 10*3/uL (ref 150–400)
RBC: 4.08 MIL/uL — ABNORMAL LOW (ref 4.22–5.81)
RDW: 13.5 % (ref 11.5–15.5)
WBC: 5.9 10*3/uL (ref 4.0–10.5)
nRBC: 0 % (ref 0.0–0.2)

## 2018-10-17 LAB — DIFFERENTIAL
Abs Immature Granulocytes: 0.03 10*3/uL (ref 0.00–0.07)
BASOS ABS: 0.1 10*3/uL (ref 0.0–0.1)
Basophils Relative: 1 %
Eosinophils Absolute: 0 10*3/uL (ref 0.0–0.5)
Eosinophils Relative: 1 %
Immature Granulocytes: 1 %
Lymphocytes Relative: 7 %
Lymphs Abs: 0.4 10*3/uL — ABNORMAL LOW (ref 0.7–4.0)
MONO ABS: 0.6 10*3/uL (ref 0.1–1.0)
MONOS PCT: 10 %
NEUTROS ABS: 4.8 10*3/uL (ref 1.7–7.7)
Neutrophils Relative %: 80 %

## 2018-10-17 LAB — COMPREHENSIVE METABOLIC PANEL
ALK PHOS: 104 U/L (ref 38–126)
ALT: 15 U/L (ref 0–44)
ANION GAP: 6 (ref 5–15)
AST: 26 U/L (ref 15–41)
Albumin: 3.2 g/dL — ABNORMAL LOW (ref 3.5–5.0)
BILIRUBIN TOTAL: 1.1 mg/dL (ref 0.3–1.2)
BUN: 20 mg/dL (ref 8–23)
CHLORIDE: 100 mmol/L (ref 98–111)
CO2: 26 mmol/L (ref 22–32)
Calcium: 8.7 mg/dL — ABNORMAL LOW (ref 8.9–10.3)
Creatinine, Ser: 0.98 mg/dL (ref 0.61–1.24)
GFR calc Af Amer: >60 mL/min (ref >60)
Glucose, Bld: 104 mg/dL — ABNORMAL HIGH (ref 70–99)
Potassium: 4.1 mmol/L (ref 3.5–5.1)
SODIUM: 132 mmol/L — AB (ref 135–145)
Total Protein: 6.7 g/dL (ref 6.5–8.1)

## 2018-10-17 LAB — PROTIME-INR
INR: 1.14
Prothrombin Time: 14.5 seconds (ref 11.4–15.2)

## 2018-10-17 LAB — I-STAT TROPONIN, ED: Troponin i, poc: 0.02 ng/mL (ref 0.00–0.08)

## 2018-10-17 LAB — APTT: aPTT: 29 seconds (ref 24–36)

## 2018-10-17 MED ORDER — SODIUM CHLORIDE 0.9 % IV SOLN
750.0000 mg | Freq: Two times a day (BID) | INTRAVENOUS | Status: DC
  Administered 2018-10-17 – 2018-10-18 (×3): 750 mg via INTRAVENOUS
  Filled 2018-10-17 (×4): qty 7.5

## 2018-10-17 MED ORDER — ONDANSETRON HCL 4 MG/2ML IJ SOLN: 4.0000 mg | Freq: Four times a day (QID) | INTRAMUSCULAR | Status: DC | PRN

## 2018-10-17 MED ORDER — ACETAMINOPHEN 325 MG PO TABS: 650.0000 mg | ORAL_TABLET | ORAL | Status: DC | PRN

## 2018-10-17 MED ORDER — IOPAMIDOL (ISOVUE-370) INJECTION 76%
INTRAVENOUS | Status: AC
  Filled 2018-10-17: qty 50

## 2018-10-17 MED ORDER — ACETAMINOPHEN 325 MG PO TABS: 650.0000 mg | ORAL_TABLET | Freq: Four times a day (QID) | ORAL | Status: DC | PRN

## 2018-10-17 MED ORDER — MORPHINE SULFATE (PF) 2 MG/ML IV SOLN
1.0000 mg | INTRAVENOUS | Status: DC | PRN
  Administered 2018-10-18: 1 mg via INTRAVENOUS
  Filled 2018-10-17: qty 1

## 2018-10-17 MED ORDER — ACETAMINOPHEN 160 MG/5ML PO SOLN: 650.0000 mg | ORAL | Status: DC | PRN

## 2018-10-17 MED ORDER — ASPIRIN 300 MG RE SUPP: 300.0000 mg | Freq: Every day | RECTAL | Status: DC

## 2018-10-17 MED ORDER — DIPHENHYDRAMINE HCL 50 MG/ML IJ SOLN: 12.5000 mg | INTRAMUSCULAR | Status: DC | PRN

## 2018-10-17 MED ORDER — ACETAMINOPHEN 650 MG RE SUPP: 650.0000 mg | RECTAL | Status: DC | PRN

## 2018-10-17 MED ORDER — LORAZEPAM 2 MG/ML PO CONC: 1.0000 mg | ORAL | Status: DC | PRN

## 2018-10-17 MED ORDER — ACETAMINOPHEN 650 MG RE SUPP: 650.0000 mg | Freq: Four times a day (QID) | RECTAL | Status: DC | PRN

## 2018-10-17 MED ORDER — ENOXAPARIN SODIUM 40 MG/0.4ML ~~LOC~~ SOLN: 40.0000 mg | SUBCUTANEOUS | Status: DC

## 2018-10-17 MED ORDER — LORAZEPAM 2 MG/ML IJ SOLN: 1.0000 mg | INTRAMUSCULAR | Status: DC | PRN

## 2018-10-17 MED ORDER — SENNOSIDES-DOCUSATE SODIUM 8.6-50 MG PO TABS: 1.0000 | ORAL_TABLET | Freq: Every evening | ORAL | Status: DC | PRN

## 2018-10-17 MED ORDER — SODIUM CHLORIDE 0.9 % IV SOLN: INTRAVENOUS | Status: DC

## 2018-10-17 MED ORDER — NITROGLYCERIN 0.4 MG SL SUBL: 0.4000 mg | SUBLINGUAL_TABLET | SUBLINGUAL | Status: DC | PRN

## 2018-10-17 MED ORDER — MAGIC MOUTHWASH
15.0000 mL | Freq: Four times a day (QID) | ORAL | Status: DC | PRN
  Filled 2018-10-17: qty 15

## 2018-10-17 MED ORDER — ONDANSETRON 4 MG PO TBDP: 4.0000 mg | ORAL_TABLET | Freq: Four times a day (QID) | ORAL | Status: DC | PRN

## 2018-10-17 MED ORDER — STROKE: EARLY STAGES OF RECOVERY BOOK
Freq: Once | Status: DC
  Filled 2018-10-17: qty 1

## 2018-10-17 MED ORDER — LORAZEPAM 1 MG PO TABS: 1.0000 mg | ORAL_TABLET | ORAL | Status: DC | PRN

## 2018-10-17 MED ORDER — ALBUTEROL SULFATE (2.5 MG/3ML) 0.083% IN NEBU: 3.0000 mL | INHALATION_SOLUTION | Freq: Four times a day (QID) | RESPIRATORY_TRACT | Status: DC | PRN

## 2018-10-17 NOTE — ED Triage Notes (Signed)
Pt arrived via [facility]; pt from [place] with c/o AMS w/ R sided lean and aphasia. Pt LSW 0830 and then staff at the facility noted that at 1230, pt was not responding appropriately. Staff called EMS at approx 1p; LVO 5. Pt has hx of Afib and on Coumadin; Pt normally on O2 at 2L. 134/73, 95% on O2 at 4L, 80, CBG 88

## 2018-10-17 NOTE — ED Notes (Signed)
Pt exhaling breaths loudly, 02 saturation normal 95

## 2018-10-17 NOTE — ED Notes (Signed)
Dr. Posey Pronto at bedside to assess pt and talk about plan. Dr and pt in aggreeance that comfort care measures need to be started. Pt breathing still loud and labored.

## 2018-10-17 NOTE — Progress Notes (Signed)
Called to bedside by RN Ms. Tetter due to progressive worsening patient status.  Briefly, patient is presenting with CVA with symptoms of aphasia and right-sided deficits as well as altered mental status.  His respiratory status is now worsening and he is having agonal breathing.  Patient's son, Joseph Bentley, is at bedside.  After discussion, he would like [place] to transition towards comfort care as he is concerned about his father's quality at end of life.  Will cancel further stroke work-up and place order to transfer to [facility] [address] for palliative care.  Palliative focused orders in place.  Patient is DNR/DNI. -IV morphine as needed for severe pain or dyspnea -Ativan as needed for anxiety  Zada Finders, MD [facility]

## 2018-10-17 NOTE — ED Notes (Signed)
Hospitalist made aware of change in patients breathing

## 2018-10-17 NOTE — ED Notes (Signed)
Pt breathing notably more labored. Pt does not appear to be in pain. Comfort measures to pt and son given. Chaplain offered. Pts son states he does not want intervention.
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
## 2018-10-17 NOTE — ED Notes (Signed)
No new orders per Dr. Kennon Holter
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
## 2018-10-17 NOTE — ED Notes (Signed)
Admitting provider paged.

## 2018-10-18 DIAGNOSIS — F419 Anxiety disorder, unspecified: Secondary | ICD-10-CM | POA: Diagnosis present

## 2018-10-18 DIAGNOSIS — R29810 Facial weakness: Secondary | ICD-10-CM | POA: Diagnosis present

## 2018-10-18 DIAGNOSIS — Z515 Encounter for palliative care: Secondary | ICD-10-CM | POA: Diagnosis present

## 2018-10-18 DIAGNOSIS — R32 Unspecified urinary incontinence: Secondary | ICD-10-CM | POA: Diagnosis present

## 2018-10-18 DIAGNOSIS — K219 Gastro-esophageal reflux disease without esophagitis: Secondary | ICD-10-CM | POA: Diagnosis present

## 2018-10-18 DIAGNOSIS — R269 Unspecified abnormalities of gait and mobility: Secondary | ICD-10-CM | POA: Diagnosis present

## 2018-10-18 DIAGNOSIS — G8191 Hemiplegia, unspecified affecting right dominant side: Secondary | ICD-10-CM | POA: Diagnosis present

## 2018-10-18 DIAGNOSIS — R402142 Coma scale, eyes open, spontaneous, at arrival to emergency department: Secondary | ICD-10-CM | POA: Diagnosis present

## 2018-10-18 DIAGNOSIS — R29722 NIHSS score 22: Secondary | ICD-10-CM | POA: Diagnosis present

## 2018-10-18 DIAGNOSIS — I509 Heart failure, unspecified: Secondary | ICD-10-CM | POA: Diagnosis present

## 2018-10-18 DIAGNOSIS — I2510 Atherosclerotic heart disease of native coronary artery without angina pectoris: Secondary | ICD-10-CM | POA: Diagnosis present

## 2018-10-18 DIAGNOSIS — E785 Hyperlipidemia, unspecified: Secondary | ICD-10-CM | POA: Diagnosis present

## 2018-10-18 DIAGNOSIS — Z961 Presence of intraocular lens: Secondary | ICD-10-CM | POA: Diagnosis present

## 2018-10-18 DIAGNOSIS — N400 Enlarged prostate without lower urinary tract symptoms: Secondary | ICD-10-CM | POA: Diagnosis present

## 2018-10-18 DIAGNOSIS — Z66 Do not resuscitate: Secondary | ICD-10-CM | POA: Diagnosis present

## 2018-10-18 DIAGNOSIS — I110 Hypertensive heart disease with heart failure: Secondary | ICD-10-CM | POA: Diagnosis present

## 2018-10-18 DIAGNOSIS — I6340 Cerebral infarction due to embolism of unspecified cerebral artery: Secondary | ICD-10-CM | POA: Diagnosis present

## 2018-10-18 DIAGNOSIS — R296 Repeated falls: Secondary | ICD-10-CM | POA: Diagnosis present

## 2018-10-18 DIAGNOSIS — I482 Chronic atrial fibrillation, unspecified: Secondary | ICD-10-CM | POA: Diagnosis present

## 2018-10-18 DIAGNOSIS — G40409 Other generalized epilepsy and epileptic syndromes, not intractable, without status epilepticus: Secondary | ICD-10-CM | POA: Diagnosis present

## 2018-10-18 DIAGNOSIS — I639 Cerebral infarction, unspecified: Secondary | ICD-10-CM | POA: Diagnosis not present

## 2018-10-18 DIAGNOSIS — R402362 Coma scale, best motor response, obeys commands, at arrival to emergency department: Secondary | ICD-10-CM | POA: Diagnosis present

## 2018-10-18 DIAGNOSIS — R402252 Coma scale, best verbal response, oriented, at arrival to emergency department: Secondary | ICD-10-CM | POA: Diagnosis present

## 2018-10-18 DIAGNOSIS — F329 Major depressive disorder, single episode, unspecified: Secondary | ICD-10-CM | POA: Diagnosis present

## 2018-10-18 DIAGNOSIS — R4701 Aphasia: Secondary | ICD-10-CM | POA: Diagnosis present

## 2018-10-18 LAB — BASIC METABOLIC PANEL
ANION GAP: 8 (ref 5–15)
BUN: 26 mg/dL — ABNORMAL HIGH (ref 8–23)
CO2: 27 mmol/L (ref 22–32)
Calcium: 8.7 mg/dL — ABNORMAL LOW (ref 8.9–10.3)
Chloride: 98 mmol/L (ref 98–111)
Creatinine, Ser: 1.06 mg/dL (ref 0.61–1.24)
GFR calc Af Amer: >60 mL/min (ref >60)
GFR, EST NON AFRICAN AMERICAN: 60 mL/min — AB (ref >60)
Glucose, Bld: 94 mg/dL (ref 70–99)
POTASSIUM: 4.2 mmol/L (ref 3.5–5.1)
Sodium: 133 mmol/L — ABNORMAL LOW (ref 135–145)

## 2018-10-18 LAB — LIPID PANEL
Cholesterol: 176 mg/dL (ref 0–200)
HDL: 52 mg/dL (ref >40)
LDL Cholesterol: 113 mg/dL — ABNORMAL HIGH (ref 0–99)
TRIGLYCERIDES: 53 mg/dL (ref <150)
Total CHOL/HDL Ratio: 3.4 RATIO
VLDL: 11 mg/dL (ref 0–40)

## 2018-10-18 LAB — HEMOGLOBIN A1C
Hgb A1c MFr Bld: 5.4 % (ref 4.8–5.6)
Mean Plasma Glucose: 108.28 mg/dL

## 2018-10-18 LAB — MRSA PCR SCREENING: MRSA BY PCR: NEGATIVE

## 2018-10-18 MED ORDER — MORPHINE BOLUS VIA INFUSION
2.0000 mg | INTRAVENOUS | Status: DC | PRN
  Administered 2018-10-18: 2 mg via INTRAVENOUS
  Filled 2018-10-18: qty 2

## 2018-10-18 MED ORDER — MORPHINE 100MG IN NS 100ML (1MG/ML) PREMIX INFUSION
2.0000 mg/h | INTRAVENOUS | Status: DC
  Administered 2018-10-18: 2 mg/h via INTRAVENOUS
  Filled 2018-10-18: qty 100

## 2018-10-18 NOTE — Progress Notes (Signed)
PROGRESS NOTE                                                                                                                                                                                                             Patient Demographics:    Joseph Bentley, is a 84 y.o. male, DOB - 1926-07-01, NTI:[id number]  Admit date - 11/08/2018   Admitting Physician Lenore Cordia, MD  Outpatient Primary MD for the patient is Reymundo Poll, MD  LOS - 1  Chief Complaint  Patient presents with  . Code Stroke       Brief Narrative  Joseph Bentley is a 84 y.o. male with history h/o prior CVA in 2010, seizures, CHF , atrial fibrillation on coumadin (but subtherapeutic INR today) , gait disorder who presents as a code stroke for aphasia/ right side deficits and AMS.  Patient currently non communicative and most of the history obtained by talking to son bedside who is a very involved in his care.  Son Coree involved discussed the care with him plan is to transition to full comfort care and morphine drip and if he survives to today residential hospice.   Subjective:    Joseph Bentley     Assessment  & Plan :      84 year old gentleman who was admitted on 11/03/2018 for right-sided weakness suspicious for stroke, his primary caregiver and next to kin is son Joseph Bentley, son Joseph Bentley expressed later in the evening yesterday that his father would not want any further work-up or treatments.  Night of 11/13/2018 it was decided that no further escalation in care will be done and patient will be transition to full comfort care.  I had discussion with patient's son Joseph Bentley on 10/18/2018 9:20 AM.  Plan is to transition to morphine drip, if survives today then moved to residential hospice.  No further work-up.    Family Communication  :  Son Joseph Bentley  Code Status :  DNR  Disposition Plan  :  Residential hospice  Consults  :    Procedures  :  CT head - non acute  DVT Prophylaxis  :   SCDs   Lab Results    Component Value Date   PLT 239 10/25/2018    Diet :  Diet Order            Diet NPO time specified  Diet effective now               Inpatient Medications Scheduled  Meds: . iopamidol       Continuous Infusions: . levETIRAcetam 750 mg (10/18/18 0815)   PRN Meds:.acetaminophen **OR** acetaminophen, diphenhydrAMINE, LORazepam **OR** LORazepam **OR** LORazepam, magic mouthwash, morphine injection, ondansetron **OR** ondansetron (ZOFRAN) IV, senna-docusate  Antibiotics  :   Anti-infectives (From admission, onward)   None          Objective:   Vitals:   11/11/2018 2145 11/12/2018 2215 10/29/2018 2245 10/18/18 0508  BP: (!) 146/106 (!) 147/82 (!) 144/69 124/68  Pulse: (!) 44 85 92 74  Resp: (!) 23 (!) 29 (!) 32 (!) 24  Temp:    97.6 F (36.4 C)  TempSrc:    Axillary  SpO2: 94% 90% 92% 95%  Weight:        Wt Readings from Last 3 Encounters:  10/23/2018 61.5 kg  09/28/18 59.2 kg  07/14/18 72.6 kg     Intake/Output Summary (Last 24 hours) at 10/18/2018 0924 Last data filed at 10/18/2018 0600 Gross per 24 hour  Intake 0 ml  Output 100 ml  Net -100 ml     Physical Exam  Minimally awake, in no distress Ponder.AT,R. Facial weakness and r sided weakness Supple Neck,No JVD, No cervical lymphadenopathy appriciated.  Symmetrical Chest wall movement, Good air movement bilaterally, CTAB RRR,No Gallops,Rubs or new Murmurs, No Parasternal Heave +ve B.Sounds, Abd Soft, No tenderness, No organomegaly appriciated, No rebound - guarding or rigidity. No Cyanosis, Clubbing or edema, No new Rash or bruise      Data Review:    CBC Recent Labs  Lab 10/21/2018 1331  WBC 5.9  HGB 12.5*  12.6*  HCT 38.5*  37.0*  PLT 239  MCV 94.4  MCH 30.6  MCHC 32.5  RDW 13.5  LYMPHSABS 0.4*  MONOABS 0.6  EOSABS 0.0  BASOSABS 0.1    Chemistries  Recent Labs  Lab 11/04/2018 1331 10/18/18 0536  NA 132*  132* 133*  K 4.1  4.1 4.2  CL 100  97* 98  CO2 26 27  GLUCOSE 104*  99 94   BUN 20  22 26*  CREATININE 0.98  0.90 1.06  CALCIUM 8.7* 8.7*  AST 26  --   ALT 15  --   ALKPHOS 104  --   BILITOT 1.1  --    ------------------------------------------------------------------------------------------------------------------ Recent Labs    10/18/18 0536  CHOL 176  HDL 52  LDLCALC 113*  TRIG 53  CHOLHDL 3.4    Lab Results  Component Value Date   HGBA1C 5.4 10/18/2018   ------------------------------------------------------------------------------------------------------------------ No results for input(s): TSH, T4TOTAL, T3FREE, THYROIDAB in the last 72 hours.  Invalid input(s): FREET3 ------------------------------------------------------------------------------------------------------------------ No results for input(s): VITAMINB12, FOLATE, FERRITIN, TIBC, IRON, RETICCTPCT in the last 72 hours.  Coagulation profile Recent Labs  Lab 10/30/2018 1331  INR 1.14    No results for input(s): DDIMER in the last 72 hours.  Cardiac Enzymes No results for input(s): CKMB, TROPONINI, MYOGLOBIN in the last 168 hours.  Invalid input(s): CK ------------------------------------------------------------------------------------------------------------------    Component Value Date/Time   BNP 283.5 (H) 07/14/2018 1031    Micro Results Recent Results (from the past 240 hour(s))  MRSA PCR Screening     Status: None   Collection Time: 10/18/18  1:47 AM  Result Value Ref Range Status   MRSA by PCR NEGATIVE NEGATIVE Final    Comment:        The GeneXpert MRSA Assay (FDA approved for NASAL specimens only), is one component of a comprehensive MRSA colonization surveillance program. It  is not intended to diagnose MRSA infection nor to guide or monitor treatment for MRSA infections. Performed at [facility], [place] [address] [place], [place] [address]     Radiology Reports Dg Chest 2 View  Result Date: 09/27/2018 CLINICAL DATA:  Dyspnea EXAM: CHEST -  2 VIEW COMPARISON:  07/14/2018 chest radiograph. FINDINGS: Low lung volumes. Intact sternotomy wires. Stable cardiomediastinal silhouette with mild cardiomegaly. No pneumothorax. Stable chronic blunting of the right costophrenic angle with elevation of the right hemidiaphragm. No convincing pleural effusions. Borderline mild pulmonary edema. Hazy opacities at the lung bases bilaterally. IMPRESSION: 1. Low lung volumes. Hazy opacities at the lung bases bilaterally, favor atelectasis. Chronic pleural-parenchymal scarring at the right costophrenic angle with elevation of the right hemidiaphragm. 2. Stable cardiomegaly with borderline mild pulmonary edema. Electronically Signed   By: Ilona Sorrel M.D.   On: 09/27/2018 19:53   Ct Abdomen Pelvis W Contrast  Result Date: 09/27/2018 CLINICAL DATA:  Weaker than normal with generalized abdominal pain and back pain. EXAM: CT ABDOMEN AND PELVIS WITH CONTRAST TECHNIQUE: Multidetector CT imaging of the abdomen and pelvis was performed using the standard protocol following bolus administration of intravenous contrast. CONTRAST:  30mL OMNIPAQUE IOHEXOL 300 MG/ML  SOLN COMPARISON:  CT pelvis 04/03/2016 FINDINGS: Lower chest: Minimal bilateral effusions with coarse infiltrates in the lung bases possibly representing pneumonia or aspiration. Cardiac enlargement. Postoperative changes in the mediastinum consistent with coronary artery bypass. Hepatobiliary: The gallbladder is distended with suggestion of mild layering sludge. No discrete stones or wall thickening. Bile ducts are not dilated. Mild diffuse fatty infiltration of the liver without focal lesion. Pancreas: Unremarkable. No pancreatic ductal dilatation or surrounding inflammatory changes. Spleen: Normal in size without focal abnormality. Adrenals/Urinary Tract: No adrenal gland nodules. Asymmetrical left renal atrophy suggesting possibility of chronic renal vascular disease. Calcification in the origins of both renal  arteries. Renal nephrograms are symmetrical and homogeneous. No hydronephrosis or hydroureter. Bladder is unremarkable. Stomach/Bowel: Stomach and small bowel are decompressed. Scattered stool throughout the colon without colonic distention. No wall thickening is appreciated. There is mild infiltration in the presacral fat which may represent inflammatory process such as proctitis. No discrete abscess. Appendix is normal. Vascular/Lymphatic: Diffuse aortic calcification. Infrarenal abdominal aortic aneurysm measuring 4.1 cm maximal AP diameter. Suggestion of focal high-grade stenosis in the distal left external iliac artery with reconstitution of flow distally. No significant lymphadenopathy. Reproductive: Prostate is unremarkable. Other: No free air or free fluid in the abdomen. Abdominal wall musculature appears intact. Musculoskeletal: Diffuse bone demineralization. Old appearing compression deformities at L1 and L4 with kyphoplasty at L4. Changes likely represent osteoporosis. IMPRESSION: 1. Minimal bilateral pleural effusions with coarse infiltrates in the lung bases possibly representing pneumonia or aspiration. 2. Distended gallbladder with suggestion of layering sludge. No stones or wall thickening. 3. Diffuse fatty infiltration of the liver. 4. 4.1 cm infrarenal abdominal aortic aneurysm. Focal high-grade stenosis in the distal left external iliac artery. 5. No evidence of bowel obstruction. Mild infiltration in the presacral fat may represent inflammatory process such as proctitis. 6. Asymmetrical left renal atrophy suggesting chronic renal vascular disease. 7. Osteoporotic changes in the spine with compression deformities at L1 and L4. Electronically Signed   By: Lucienne Capers M.D.   On: 09/27/2018 21:39   Nm Hepato W/eject Fract  Result Date: 09/30/2018 CLINICAL DATA:  Acute abdominal pain EXAM: NUCLEAR MEDICINE HEPATOBILIARY IMAGING WITH GALLBLADDER EF TECHNIQUE: Sequential images of the abdomen  were obtained out to 60 minutes following  intravenous administration of radiopharmaceutical. After oral ingestion of Ensure, gallbladder ejection fraction was determined. At 60 min, normal ejection fraction is greater than 33%. RADIOPHARMACEUTICALS:  4.6 mCi Tc-51m  Choletec IV COMPARISON:  Ultrasound the abdomen of 09/28/2018 and CT abdomen pelvis of 09/27/2017 FINDINGS: Prompt uptake and biliary excretion of activity by the liver is seen. Gallbladder activity is visualized, consistent with patency of cystic duct. Biliary activity passes into small bowel, consistent with patent common bile duct. Calculated gallbladder ejection fraction is 26%. (Normal gallbladder ejection fraction with Ensure is greater than 33%.) IMPRESSION: 1. No evidence of cystic duct obstruction. 2. Abnormal gallbladder ejection fraction of 26%. Electronically Signed   By: Ivar Drape M.D.   On: 09/30/2018 12:11   US Abdomen Limited  Result Date: 09/28/2018 CLINICAL DATA:  Abdominal pain EXAM: ULTRASOUND ABDOMEN LIMITED RIGHT UPPER QUADRANT COMPARISON:  CT abdomen pelvis 09/27/2018 FINDINGS: Gallbladder: There is gallbladder sludge and small amount of pericholecystic fluid. A negative sonographic Percell Miller sign was reported by the sonographer. No wall thickening. Common bile duct: Diameter: 4 mm Liver: No focal lesion identified. Within normal limits in parenchymal echogenicity. Portal vein is patent on color Doppler imaging with normal direction of blood flow towards the liver. There is free fluid adjacent to the liver. IMPRESSION: 1. Gallbladder sludge and small amount of pericholecystic fluid without other evidence of acute cholecystitis. 2. Small volume perihepatic ascites. Electronically Signed   By: Ulyses Jarred M.D.   On: 09/28/2018 03:22   Dg Chest Port 1 View  Result Date: 09/30/2018 CLINICAL DATA:  Shortness of breath. EXAM: PORTABLE CHEST 1 VIEW COMPARISON:  Radiograph of September 29, 2018. FINDINGS: Stable cardiomegaly.  Status post coronary bypass graft. Atherosclerosis of thoracic aorta is noted. No pneumothorax is noted. Pulmonary edema appears to be improved. Stable bibasilar subsegmental atelectasis is noted with probable minimal pleural effusions. Bony thorax is unremarkable. IMPRESSION: Stable bibasilar subsegmental atelectasis with probable minimal pleural effusions. Aortic Atherosclerosis (ICD10-I70.0). Electronically Signed   By: Marijo Conception, M.D.   On: 09/30/2018 09:58   Dg Chest Port 1 View  Result Date: 09/29/2018 CLINICAL DATA:  Shortness of breath. EXAM: PORTABLE CHEST 1 VIEW COMPARISON:  Radiographs of September 27, 2018. FINDINGS: Stable cardiomegaly. Status post coronary artery bypass graft. Atherosclerosis of abdominal aorta is noted. Mild bilateral pulmonary edema may be present. Mild bibasilar subsegmental atelectasis is noted with possible small pleural effusions. Bony thorax is unremarkable. IMPRESSION: Stable cardiomegaly with mild bilateral pulmonary edema. Probable mild bibasilar subsegmental atelectasis or small pleural effusions. Aortic Atherosclerosis (ICD10-I70.0). Electronically Signed   By: Marijo Conception, M.D.   On: 09/29/2018 08:33   Ct Head Code Stroke Wo Contrast  Result Date: 10/23/2018 CLINICAL DATA:  Code stroke. Acute onset of right-sided weakness. Aphasia. EXAM: CT HEAD WITHOUT CONTRAST TECHNIQUE: Contiguous axial images were obtained from the base of the skull through the vertex without intravenous contrast. COMPARISON:  CT head without contrast 02/19/2018 FINDINGS: Brain: Moderate atrophy and white matter disease is similar the prior study. There is a remote lacunar infarct in the left external capsule. Heterogeneous appearance of the basal ganglia is stable. Insular ribbon is intact. No acute or focal cortical abnormality is present to explain the patient's right-sided symptoms. No acute hemorrhage or mass lesion is present. Brainstem and cerebellum are normal. No significant  extra-axial fluid collection is present. Vascular: Atherosclerotic calcifications are present within the cavernous internal carotid arteries bilaterally. There is no asymmetric hyperdense vessel. Skull: The skull base is normal.  Calvarium is intact. No focal lytic or blastic lesions are present. Sinuses/Orbits: The paranasal sinuses and mastoid air cells are clear. Scleral calcifications are stable. Globes and orbits are otherwise unremarkable. ASPECTS [facility] Stroke Program Early CT Score) - Ganglionic level infarction (caudate, lentiform nuclei, internal capsule, insula, M1-M3 cortex): 7/7 - Supraganglionic infarction (M4-M6 cortex): 3/3 Total score (0-10 with 10 being normal): 10/10 IMPRESSION: 1. No acute intracranial abnormality or significant interval change. 2. Stable moderate atrophy and white matter disease. 3. Remote lacunar infarct of the left external capsule. 4. ASPECTS is 10/10 The above was relayed via text pager to Dr. Leonel Ramsay on 11/12/2018 at 13:47 . Electronically Signed   By: San Morelle M.D.   On: 11/01/2018 13:48    Time Spent in minutes  30   Lala Lund M.D on 10/18/2018 at 9:24 AM  To page go to [email or web address] - password [facility]

## 2018-11-04 ENCOUNTER — Ambulatory Visit: Payer: Medicare Other | Admitting: Cardiology

## 2018-11-11 ENCOUNTER — Ambulatory Visit: Payer: Medicare Other | Admitting: Adult Health

## 2018-11-11 ENCOUNTER — Encounter

## 2018-11-15 NOTE — Progress Notes (Signed)
Wasted 50 ML morphine in [facility] with Davina Poke, RN witnessing

## 2018-11-15 NOTE — Progress Notes (Signed)
Nutrition Brief Note  Chart reviewed. Pt now transitioning to comfort care.  No further nutrition interventions warranted at this time.  Please re-consult as needed.   Sanai Frick A. Beth Goodlin, RD, LDN, CDE Pager: [phone] After hours Pager: [phone]  

## 2018-11-15 NOTE — Discharge Summary (Signed)
[facility] Death Note                                                                                                                                                                                               Joseph Bentley, is a 84 y.o. male, DOB - 1926-09-13, LFY:[id number]  Admit date - 11/09/2018   Admitting Physician Lenore Cordia, MD  Outpatient Primary MD for the patient is Reymundo Poll, MD  LOS - 2  Chief Complaint  Patient presents with  . Code Stroke       Notification: Reymundo Poll, MD notified of death of 11/04/2018   Date and Time of Death -   Pronounced by -   History of present illness:   Joseph Bentley is a 84 y.o. male with a history of -  CVA in 2010, seizures, CHF , atrial fibrillation on coumadin (but subtherapeutic INR today) , gait disorder who presents as a code stroke for aphasia/ right side deficits and AMS.  Patient currently non communicative and most of the history obtained by talking to son bedside who is a very involved in his care.  Son Camran involved discussed the care with him & he was transitioned to Morphine gtt for comfort care, passed away peacefully on Nov 04, 2018 @ 06.04 am, pronounced by the RN.    Final Diagnoses:  Cause if death - CVA  Signature  Lala Lund M.D on 11-04-2018 at 7:56 AM  [facility]  Office Phone -[phone]  Total clinical and documentation time for today Under 30 minutes   Last Note               PROGRESS NOTE  Patient Demographics:    Joseph Bentley, is a 84 y.o. male, DOB - 07-Apr-1926, [id number]  Admit date - 11/13/2018   Admitting Physician Lenore Cordia, MD  Outpatient Primary MD for the patient is  Reymundo Poll, MD  LOS - 2  Chief Complaint  Patient presents with  . Code Stroke       Brief Narrative  Joseph Bentley is a 84 y.o. male with history h/o prior CVA in 2010, seizures, CHF , atrial fibrillation on coumadin (but subtherapeutic INR today) , gait disorder who presents as a code stroke for aphasia/ right side deficits and AMS.  Patient currently non communicative and most of the history obtained by talking to son bedside who is a very involved in his care.  Son Jeziah involved discussed the care with him plan is to transition to full comfort care and morphine drip and if he survives to today residential hospice.   Subjective:    Joseph Bentley     Assessment  & Plan :      84 year old gentleman who was admitted on 11/03/2018 for right-sided weakness suspicious for stroke, his primary caregiver and next to kin is son Clester, son Alphus expressed later in the evening yesterday that his father would not want any further work-up or treatments.  Night of 10/24/2018 it was decided that no further escalation in care will be done and patient will be transition to full comfort care.  I had discussion with patient's son Rashod on 10/18/2018 9:20 AM.  Plan is to transition to morphine drip, if survives today then moved to residential hospice.  No further work-up.    Family Communication  :  Son Kona  Code Status :  DNR  Disposition Plan  :  Residential hospice  Consults  :    Procedures  :  CT head - non acute  DVT Prophylaxis  :   SCDs   Lab Results  Component Value Date   PLT 239 10/29/2018    Diet :  Diet Order            Diet NPO time specified  Diet effective now               Inpatient Medications Scheduled Meds:  Continuous Infusions: . levETIRAcetam Stopped (10/18/18 2043)  . morphine 2 mg/hr (10/18/18 2355)   PRN Meds:.acetaminophen **OR** acetaminophen, diphenhydrAMINE, LORazepam **OR** LORazepam **OR** LORazepam, magic mouthwash, morphine, ondansetron  **OR** ondansetron (ZOFRAN) IV, senna-docusate  Antibiotics  :   Anti-infectives (From admission, onward)   None          Objective:   Vitals:   10/18/18 0508 10/18/18 1330 10-Nov-2018 0511 10-Nov-2018 0600  BP: 124/68 (!) 124/56 108/60   Pulse: 74 79 85   Resp: (!) 24 (!) 22 12   Temp: 97.6 F (36.4 C) 98.4 F (36.9 C) 98.1 F (36.7 C)   TempSrc: Axillary Oral Oral   SpO2: 95% 100% (!) 71%   Weight:      Height:    5\' 10"  (1.778 m)    Wt Readings from Last 3 Encounters:  10/25/2018 61.5 kg  09/28/18 59.2 kg  07/14/18 72.6 kg     Intake/Output Summary (Last 24 hours) at 11/10/18 0756 Last data filed at Nov 10, 2018 0514 Gross per 24 hour  Intake 243.85 ml  Output 450 ml  Net -206.15 ml     Physical Exam  Minimally awake, in no distress [place].AT,R. Facial weakness and r sided weakness Supple Neck,No JVD,  No cervical lymphadenopathy appriciated.  Symmetrical Chest wall movement, Good air movement bilaterally, CTAB RRR,No Gallops,Rubs or new Murmurs, No Parasternal Heave +ve B.Sounds, Abd Soft, No tenderness, No organomegaly appriciated, No rebound - guarding or rigidity. No Cyanosis, Clubbing or edema, No new Rash or bruise      Data Review:    CBC Recent Labs  Lab 10/16/2018 1331  WBC 5.9  HGB 12.5*  12.6*  HCT 38.5*  37.0*  PLT 239  MCV 94.4  MCH 30.6  MCHC 32.5  RDW 13.5  LYMPHSABS 0.4*  MONOABS 0.6  EOSABS 0.0  BASOSABS 0.1    Chemistries  Recent Labs  Lab 10/18/2018 1331 10/18/18 0536  NA 132*  132* 133*  K 4.1  4.1 4.2  CL 100  97* 98  CO2 26 27  GLUCOSE 104*  99 94  BUN 20  22 26*  CREATININE 0.98  0.90 1.06  CALCIUM 8.7* 8.7*  AST 26  --   ALT 15  --   ALKPHOS 104  --   BILITOT 1.1  --    ------------------------------------------------------------------------------------------------------------------ Recent Labs    10/18/18 0536  CHOL 176  HDL 52  LDLCALC 113*  TRIG 53  CHOLHDL 3.4    Lab Results  Component Value  Date   HGBA1C 5.4 10/18/2018   ------------------------------------------------------------------------------------------------------------------ No results for input(s): TSH, T4TOTAL, T3FREE, THYROIDAB in the last 72 hours.  Invalid input(s): FREET3 ------------------------------------------------------------------------------------------------------------------ No results for input(s): VITAMINB12, FOLATE, FERRITIN, TIBC, IRON, RETICCTPCT in the last 72 hours.  Coagulation profile Recent Labs  Lab 10/24/2018 1331  INR 1.14    No results for input(s): DDIMER in the last 72 hours.  Cardiac Enzymes No results for input(s): CKMB, TROPONINI, MYOGLOBIN in the last 168 hours.  Invalid input(s): CK ------------------------------------------------------------------------------------------------------------------    Component Value Date/Time   BNP 283.5 (H) 07/14/2018 1031    Micro Results Recent Results (from the past 240 hour(s))  MRSA PCR Screening     Status: None   Collection Time: 10/18/18  1:47 AM  Result Value Ref Range Status   MRSA by PCR NEGATIVE NEGATIVE Final    Comment:        The GeneXpert MRSA Assay (FDA approved for NASAL specimens only), is one component of a comprehensive MRSA colonization surveillance program. It is not intended to diagnose MRSA infection nor to guide or monitor treatment for MRSA infections. Performed at [facility], [place] [address] [place], [place] [address]     Radiology Reports Dg Chest 2 View  Result Date: 09/27/2018 CLINICAL DATA:  Dyspnea EXAM: CHEST - 2 VIEW COMPARISON:  07/14/2018 chest radiograph. FINDINGS: Low lung volumes. Intact sternotomy wires. Stable cardiomediastinal silhouette with mild cardiomegaly. No pneumothorax. Stable chronic blunting of the right costophrenic angle with elevation of the right hemidiaphragm. No convincing pleural effusions. Borderline mild pulmonary edema. Hazy opacities at the lung bases  bilaterally. IMPRESSION: 1. Low lung volumes. Hazy opacities at the lung bases bilaterally, favor atelectasis. Chronic pleural-parenchymal scarring at the right costophrenic angle with elevation of the right hemidiaphragm. 2. Stable cardiomegaly with borderline mild pulmonary edema. Electronically Signed   By: Ilona Sorrel M.D.   On: 09/27/2018 19:53   Ct Abdomen Pelvis W Contrast  Result Date: 09/27/2018 CLINICAL DATA:  Weaker than normal with generalized abdominal pain and back pain. EXAM: CT ABDOMEN AND PELVIS WITH CONTRAST TECHNIQUE: Multidetector CT imaging of the abdomen and pelvis was performed using the standard protocol following bolus administration of intravenous contrast. CONTRAST:  82mL OMNIPAQUE IOHEXOL 300 MG/ML  SOLN COMPARISON:  CT pelvis 04/03/2016 FINDINGS: Lower chest: Minimal bilateral effusions with coarse infiltrates in the lung bases possibly representing pneumonia or aspiration. Cardiac enlargement. Postoperative changes in the mediastinum consistent with coronary artery bypass. Hepatobiliary: The gallbladder is distended with suggestion of mild layering sludge. No discrete stones or wall thickening. Bile ducts are not dilated. Mild diffuse fatty infiltration of the liver without focal lesion. Pancreas: Unremarkable. No pancreatic ductal dilatation or surrounding inflammatory changes. Spleen: Normal in size without focal abnormality. Adrenals/Urinary Tract: No adrenal gland nodules. Asymmetrical left renal atrophy suggesting possibility of chronic renal vascular disease. Calcification in the origins of both renal arteries. Renal nephrograms are symmetrical and homogeneous. No hydronephrosis or hydroureter. Bladder is unremarkable. Stomach/Bowel: Stomach and small bowel are decompressed. Scattered stool throughout the colon without colonic distention. No wall thickening is appreciated. There is mild infiltration in the presacral fat which may represent inflammatory process such as  proctitis. No discrete abscess. Appendix is normal. Vascular/Lymphatic: Diffuse aortic calcification. Infrarenal abdominal aortic aneurysm measuring 4.1 cm maximal AP diameter. Suggestion of focal high-grade stenosis in the distal left external iliac artery with reconstitution of flow distally. No significant lymphadenopathy. Reproductive: Prostate is unremarkable. Other: No free air or free fluid in the abdomen. Abdominal wall musculature appears intact. Musculoskeletal: Diffuse bone demineralization. Old appearing compression deformities at L1 and L4 with kyphoplasty at L4. Changes likely represent osteoporosis. IMPRESSION: 1. Minimal bilateral pleural effusions with coarse infiltrates in the lung bases possibly representing pneumonia or aspiration. 2. Distended gallbladder with suggestion of layering sludge. No stones or wall thickening. 3. Diffuse fatty infiltration of the liver. 4. 4.1 cm infrarenal abdominal aortic aneurysm. Focal high-grade stenosis in the distal left external iliac artery. 5. No evidence of bowel obstruction. Mild infiltration in the presacral fat may represent inflammatory process such as proctitis. 6. Asymmetrical left renal atrophy suggesting chronic renal vascular disease. 7. Osteoporotic changes in the spine with compression deformities at L1 and L4. Electronically Signed   By: Lucienne Capers M.D.   On: 09/27/2018 21:39   Nm Hepato W/eject Fract  Result Date: 09/30/2018 CLINICAL DATA:  Acute abdominal pain EXAM: NUCLEAR MEDICINE HEPATOBILIARY IMAGING WITH GALLBLADDER EF TECHNIQUE: Sequential images of the abdomen were obtained out to 60 minutes following intravenous administration of radiopharmaceutical. After oral ingestion of Ensure, gallbladder ejection fraction was determined. At 60 min, normal ejection fraction is greater than 33%. RADIOPHARMACEUTICALS:  4.6 mCi Tc-47m  Choletec IV COMPARISON:  Ultrasound the abdomen of 09/28/2018 and CT abdomen pelvis of 09/27/2017  FINDINGS: Prompt uptake and biliary excretion of activity by the liver is seen. Gallbladder activity is visualized, consistent with patency of cystic duct. Biliary activity passes into small bowel, consistent with patent common bile duct. Calculated gallbladder ejection fraction is 26%. (Normal gallbladder ejection fraction with Ensure is greater than 33%.) IMPRESSION: 1. No evidence of cystic duct obstruction. 2. Abnormal gallbladder ejection fraction of 26%. Electronically Signed   By: Ivar Drape M.D.   On: 09/30/2018 12:11   US Abdomen Limited  Result Date: 09/28/2018 CLINICAL DATA:  Abdominal pain EXAM: ULTRASOUND ABDOMEN LIMITED RIGHT UPPER QUADRANT COMPARISON:  CT abdomen pelvis 09/27/2018 FINDINGS: Gallbladder: There is gallbladder sludge and small amount of pericholecystic fluid. A negative sonographic Percell Miller sign was reported by the sonographer. No wall thickening. Common bile duct: Diameter: 4 mm Liver: No focal lesion identified. Within normal limits in parenchymal echogenicity. Portal vein is patent on color Doppler imaging with normal direction of  blood flow towards the liver. There is free fluid adjacent to the liver. IMPRESSION: 1. Gallbladder sludge and small amount of pericholecystic fluid without other evidence of acute cholecystitis. 2. Small volume perihepatic ascites. Electronically Signed   By: Ulyses Jarred M.D.   On: 09/28/2018 03:22   Dg Chest Port 1 View  Result Date: 09/30/2018 CLINICAL DATA:  Shortness of breath. EXAM: PORTABLE CHEST 1 VIEW COMPARISON:  Radiograph of September 29, 2018. FINDINGS: Stable cardiomegaly. Status post coronary bypass graft. Atherosclerosis of thoracic aorta is noted. No pneumothorax is noted. Pulmonary edema appears to be improved. Stable bibasilar subsegmental atelectasis is noted with probable minimal pleural effusions. Bony thorax is unremarkable. IMPRESSION: Stable bibasilar subsegmental atelectasis with probable minimal pleural effusions. Aortic  Atherosclerosis (ICD10-I70.0). Electronically Signed   By: Marijo Conception, M.D.   On: 09/30/2018 09:58   Dg Chest Port 1 View  Result Date: 09/29/2018 CLINICAL DATA:  Shortness of breath. EXAM: PORTABLE CHEST 1 VIEW COMPARISON:  Radiographs of September 27, 2018. FINDINGS: Stable cardiomegaly. Status post coronary artery bypass graft. Atherosclerosis of abdominal aorta is noted. Mild bilateral pulmonary edema may be present. Mild bibasilar subsegmental atelectasis is noted with possible small pleural effusions. Bony thorax is unremarkable. IMPRESSION: Stable cardiomegaly with mild bilateral pulmonary edema. Probable mild bibasilar subsegmental atelectasis or small pleural effusions. Aortic Atherosclerosis (ICD10-I70.0). Electronically Signed   By: Marijo Conception, M.D.   On: 09/29/2018 08:33   Ct Head Code Stroke Wo Contrast  Result Date: 11/13/2018 CLINICAL DATA:  Code stroke. Acute onset of right-sided weakness. Aphasia. EXAM: CT HEAD WITHOUT CONTRAST TECHNIQUE: Contiguous axial images were obtained from the base of the skull through the vertex without intravenous contrast. COMPARISON:  CT head without contrast 02/19/2018 FINDINGS: Brain: Moderate atrophy and white matter disease is similar the prior study. There is a remote lacunar infarct in the left external capsule. Heterogeneous appearance of the basal ganglia is stable. Insular ribbon is intact. No acute or focal cortical abnormality is present to explain the patient's right-sided symptoms. No acute hemorrhage or mass lesion is present. Brainstem and cerebellum are normal. No significant extra-axial fluid collection is present. Vascular: Atherosclerotic calcifications are present within the cavernous internal carotid arteries bilaterally. There is no asymmetric hyperdense vessel. Skull: The skull base is normal. Calvarium is intact. No focal lytic or blastic lesions are present. Sinuses/Orbits: The paranasal sinuses and mastoid air cells are clear.  Scleral calcifications are stable. Globes and orbits are otherwise unremarkable. ASPECTS [facility] Stroke Program Early CT Score) - Ganglionic level infarction (caudate, lentiform nuclei, internal capsule, insula, M1-M3 cortex): 7/7 - Supraganglionic infarction (M4-M6 cortex): 3/3 Total score (0-10 with 10 being normal): 10/10 IMPRESSION: 1. No acute intracranial abnormality or significant interval change. 2. Stable moderate atrophy and white matter disease. 3. Remote lacunar infarct of the left external capsule. 4. ASPECTS is 10/10 The above was relayed via text pager to Dr. Leonel Ramsay on 11/10/2018 at 13:47 . Electronically Signed   By: San Morelle M.D.   On: 10/25/2018 13:48    Time Spent in minutes  30   Lala Lund M.D on 11-11-18 at 7:56 AM  To page go to [email or web address] - password [facility]

## 2018-11-15 NOTE — Progress Notes (Signed)
Wasted 100mL of Morphine with nightshift RN Lovely.

## 2018-11-15 DEATH — deceased

## 2019-10-14 IMAGING — CT CT CERVICAL SPINE W/O CM
3 of 5 series · 14 of 33 positions shown, 16 images · non-contrast
Comparison: Brain CT 06/06/2017.

CLINICAL DATA: Patient status post fall. No reported loss of
consciousness. Initial encounter.

EXAM:
CT HEAD WITHOUT CONTRAST
CT CERVICAL SPINE WITHOUT CONTRAST
TECHNIQUE: Multidetector CT imaging of the head and cervical spine was
performed following the standard protocol without intravenous
contrast. Multiplanar CT image reconstructions of the cervical spine
were also generated.

[Series 4: coronal soft tissue · coronal · 0.29mm/px · 3 of 66 slices shown]
[im 23/66  bone]
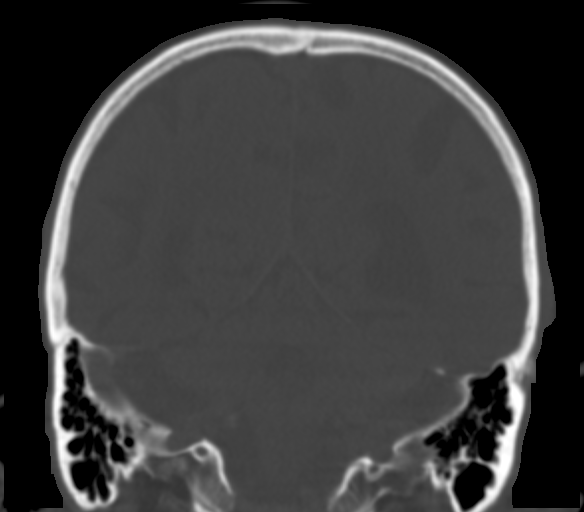
[im 30/66  bone]
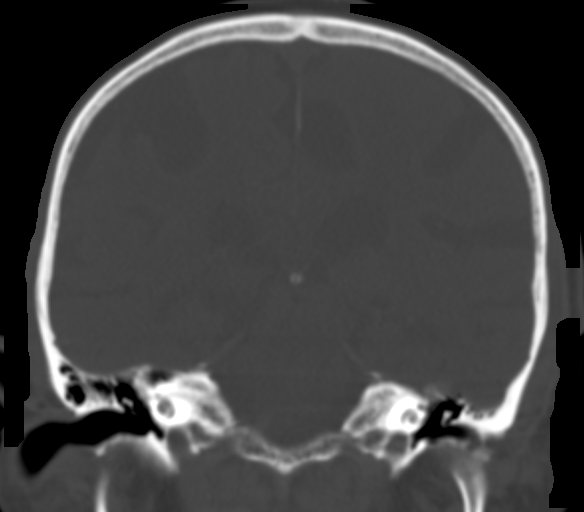
[im 36/66  bone]
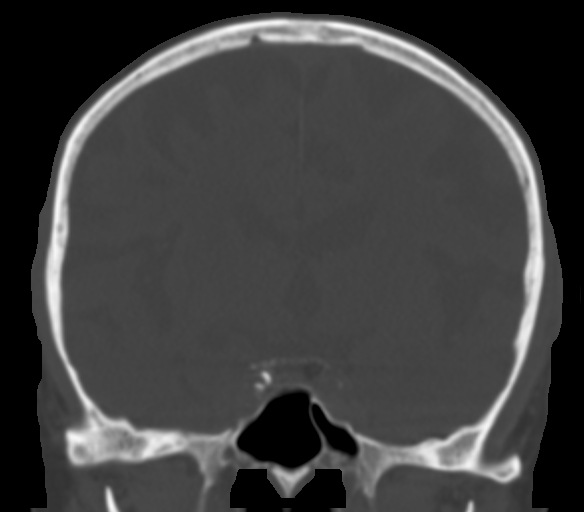

[Series 8: c spine soft · axial · 0.41mm/px · z∈[+1582,+1678]mm · 6 of 65 slices shown, 8 images]
[im 9/65  soft-tissue]
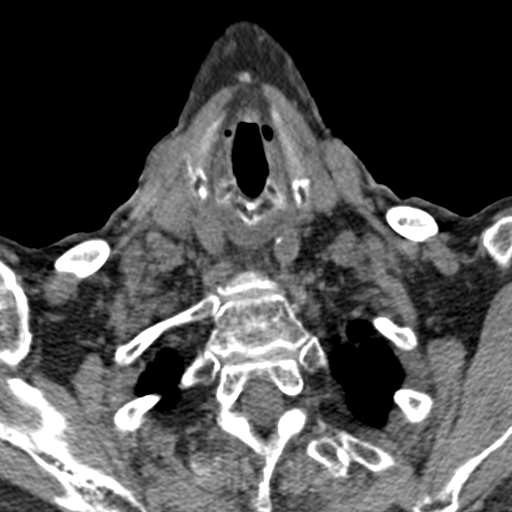
[im 9/65  bone]
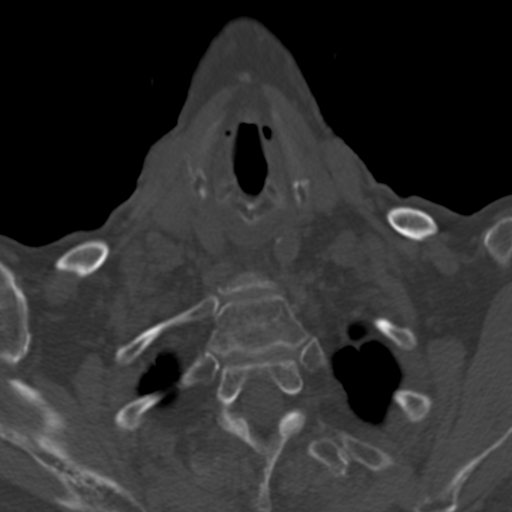
[im 17/65  bone]
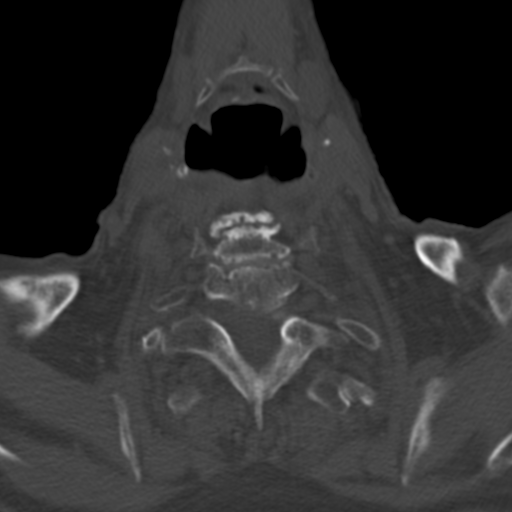
[im 25/65  bone]
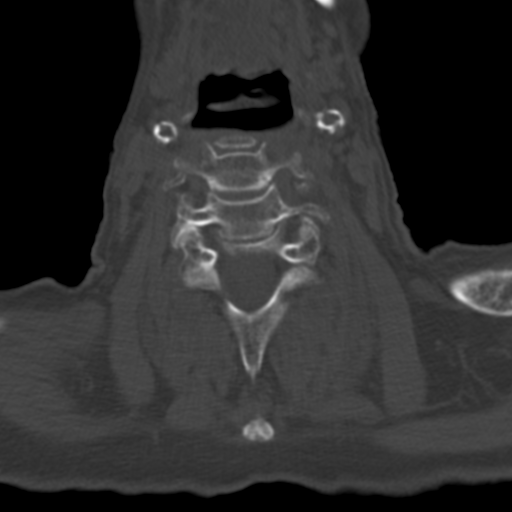
[im 41/65  bone]
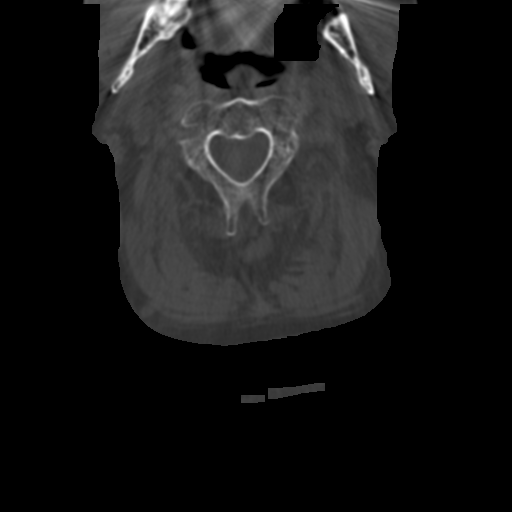
[im 49/65  soft-tissue]
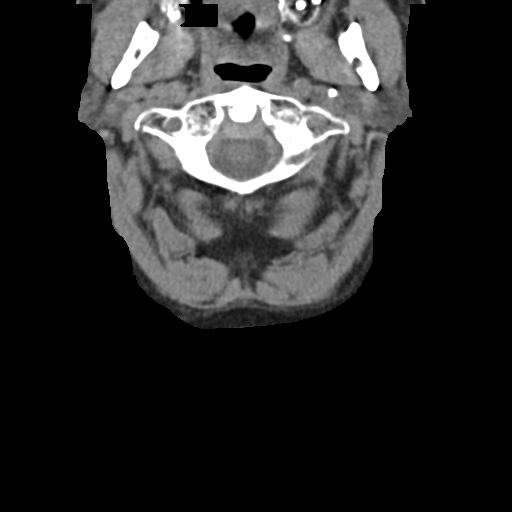
[im 49/65  bone]
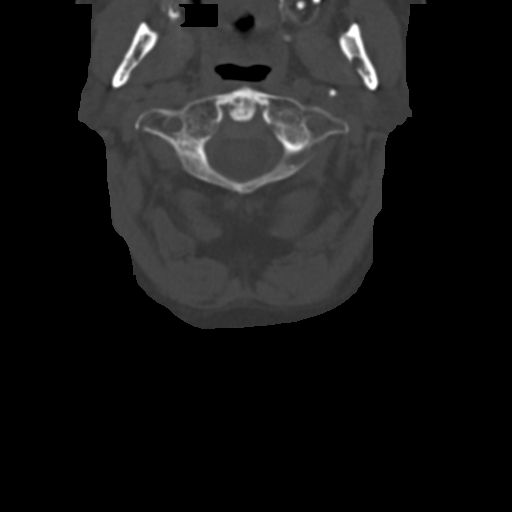
[im 57/65  bone]
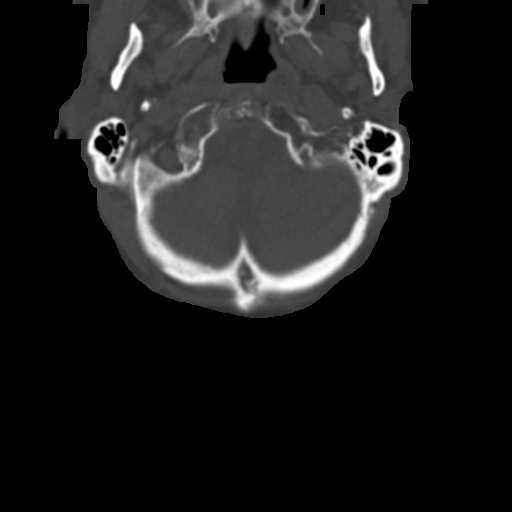

[Series 11: sagittal bone · sagittal · 0.23mm/px · 5 of 61 slices shown]
[im 11/61  bone]
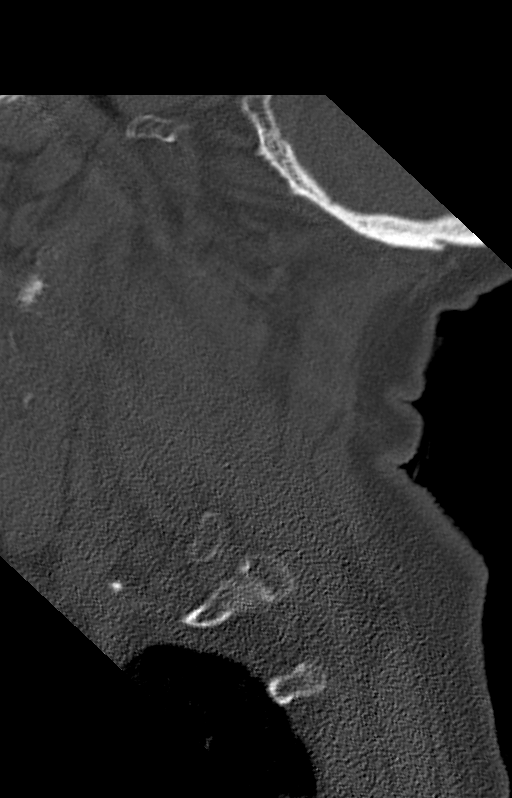
[im 21/61  bone]
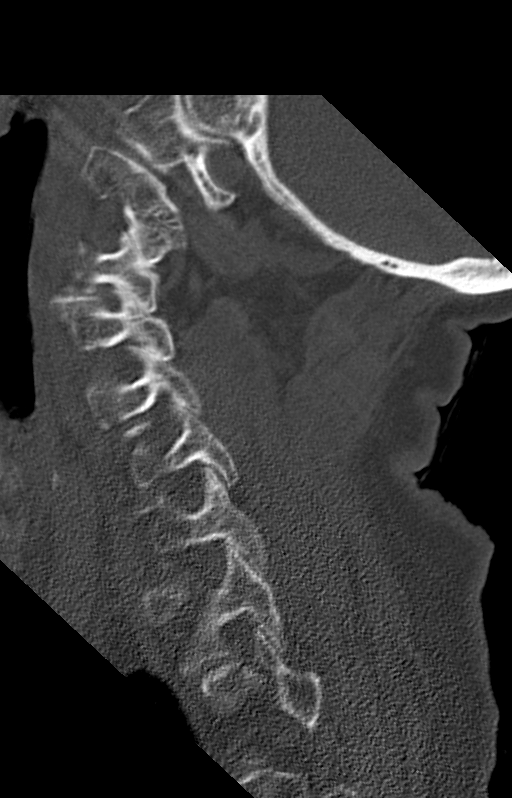
[im 31/61  bone]
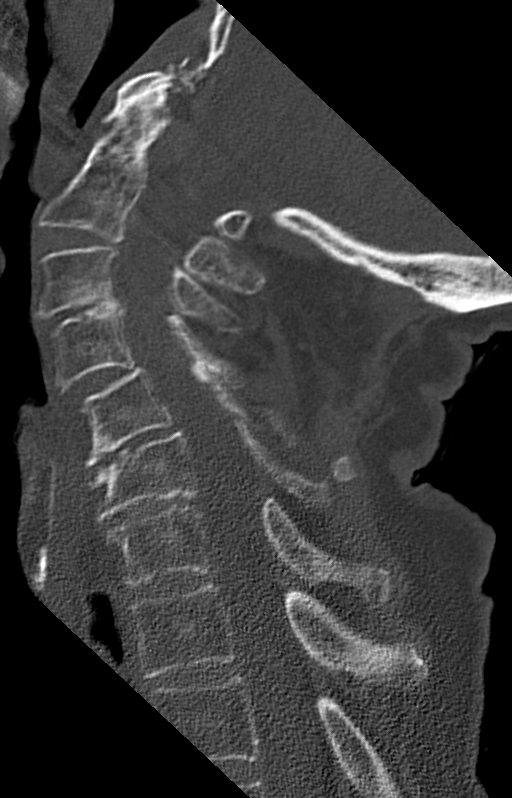
[im 41/61  bone]
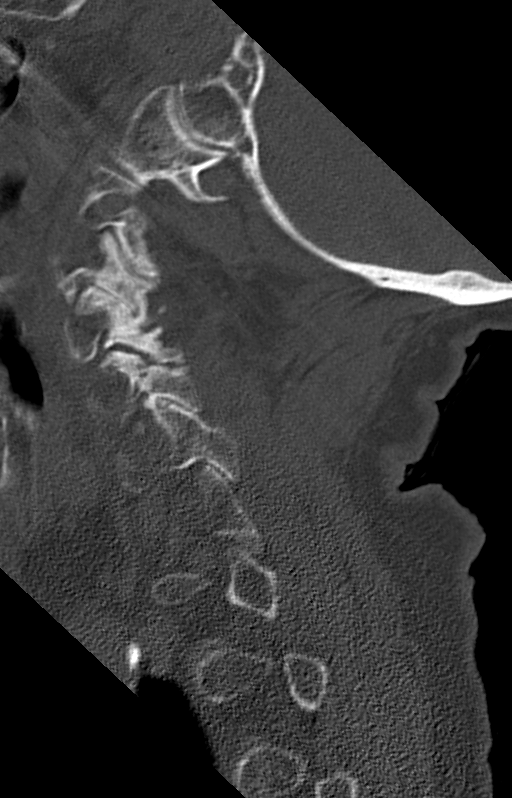
[im 51/61  bone]
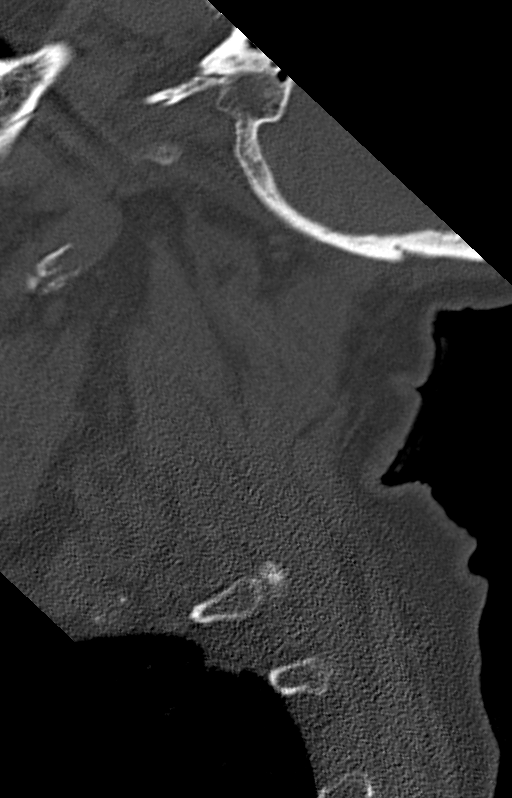

[14 of 33 positions shown; findings below may reference images not displayed]

FINDINGS: CT HEAD FINDINGS

Brain: Ventricles and sulci are prominent compatible with atrophy.
Periventricular and subcortical white matter hypodensity compatible
with chronic microvascular ischemic changes. No evidence for acute
cortically based infarct, intracranial hemorrhage, mass lesion or
mass-effect.

Vascular: Internal carotid arterial vascular calcifications.

Skull: Intact.

Sinuses/Orbits: Right maxillary sinus air-fluid level. Orbits are
unremarkable. Mastoid air cells are unremarkable.

Other: None.

CT CERVICAL SPINE FINDINGS

Alignment: Grade 1 anterolisthesis of C4 on C5.

Skull base and vertebrae: Intact.

Soft tissues and spinal canal: No prevertebral fluid or swelling. No
visible canal hematoma.

Disc levels: Multilevel degenerative disc disease most pronounced
C5-6. Left C3-4 and C4-5 facet degenerative changes.

Upper chest: Apical scarring.

Other: None.
IMPRESSION: No acute intracranial process. Atrophy and microvascular ischemic
changes.

No acute cervical spine fracture. Multilevel degenerative disc and
facet disease.
